# Patient Record
Sex: Male | Born: 1960 | Race: Black or African American | Hispanic: No | Marital: Married | Smoking: Never smoker
Health system: Southern US, Community
[De-identification: ages and names within clinical notes are randomized; demographics above are authoritative.]

## PROBLEM LIST (undated history)

## (undated) DIAGNOSIS — R569 Unspecified convulsions: Secondary | ICD-10-CM

## (undated) DIAGNOSIS — I252 Old myocardial infarction: Secondary | ICD-10-CM

## (undated) DIAGNOSIS — I10 Essential (primary) hypertension: Secondary | ICD-10-CM

## (undated) DIAGNOSIS — E119 Type 2 diabetes mellitus without complications: Secondary | ICD-10-CM

## (undated) DIAGNOSIS — I639 Cerebral infarction, unspecified: Secondary | ICD-10-CM

## (undated) HISTORY — DX: Essential (primary) hypertension: I10

## (undated) HISTORY — DX: Cerebral infarction, unspecified: I63.9

## (undated) HISTORY — DX: Unspecified convulsions: R56.9

---

## 2017-04-09 LAB — CBC AND DIFFERENTIAL
HCT: 28 — AB (ref 41–53)
HEMOGLOBIN: 8.9 — AB (ref 13.5–17.5)
NEUTROS ABS: 6
PLATELETS: 239 (ref 150–399)
WBC: 8.9

## 2017-05-11 DIAGNOSIS — S3982XA Other specified injuries of lower back, initial encounter: Secondary | ICD-10-CM | POA: Diagnosis not present

## 2017-05-11 DIAGNOSIS — M25551 Pain in right hip: Secondary | ICD-10-CM | POA: Diagnosis not present

## 2017-05-11 DIAGNOSIS — R569 Unspecified convulsions: Secondary | ICD-10-CM | POA: Diagnosis not present

## 2017-05-11 DIAGNOSIS — S7000XA Contusion of unspecified hip, initial encounter: Secondary | ICD-10-CM | POA: Diagnosis not present

## 2017-05-11 DIAGNOSIS — S29012A Strain of muscle and tendon of back wall of thorax, initial encounter: Secondary | ICD-10-CM | POA: Diagnosis not present

## 2017-05-11 DIAGNOSIS — S1093XA Contusion of unspecified part of neck, initial encounter: Secondary | ICD-10-CM | POA: Diagnosis not present

## 2017-05-11 DIAGNOSIS — M549 Dorsalgia, unspecified: Secondary | ICD-10-CM | POA: Diagnosis not present

## 2017-06-01 DIAGNOSIS — I10 Essential (primary) hypertension: Secondary | ICD-10-CM | POA: Diagnosis not present

## 2017-06-01 DIAGNOSIS — R112 Nausea with vomiting, unspecified: Secondary | ICD-10-CM | POA: Diagnosis not present

## 2017-06-01 DIAGNOSIS — E119 Type 2 diabetes mellitus without complications: Secondary | ICD-10-CM | POA: Diagnosis not present

## 2017-06-01 DIAGNOSIS — N201 Calculus of ureter: Secondary | ICD-10-CM | POA: Diagnosis not present

## 2017-06-01 DIAGNOSIS — Z7984 Long term (current) use of oral hypoglycemic drugs: Secondary | ICD-10-CM | POA: Diagnosis not present

## 2017-06-01 DIAGNOSIS — R197 Diarrhea, unspecified: Secondary | ICD-10-CM | POA: Diagnosis not present

## 2017-06-01 DIAGNOSIS — N210 Calculus in bladder: Secondary | ICD-10-CM | POA: Diagnosis not present

## 2017-06-01 DIAGNOSIS — R1031 Right lower quadrant pain: Secondary | ICD-10-CM | POA: Diagnosis not present

## 2017-06-01 DIAGNOSIS — R109 Unspecified abdominal pain: Secondary | ICD-10-CM | POA: Diagnosis not present

## 2017-06-01 DIAGNOSIS — N132 Hydronephrosis with renal and ureteral calculous obstruction: Secondary | ICD-10-CM | POA: Diagnosis not present

## 2017-06-01 DIAGNOSIS — Z79899 Other long term (current) drug therapy: Secondary | ICD-10-CM | POA: Diagnosis not present

## 2017-06-04 DIAGNOSIS — R112 Nausea with vomiting, unspecified: Secondary | ICD-10-CM | POA: Diagnosis not present

## 2017-06-04 DIAGNOSIS — N200 Calculus of kidney: Secondary | ICD-10-CM | POA: Diagnosis not present

## 2017-06-04 DIAGNOSIS — R109 Unspecified abdominal pain: Secondary | ICD-10-CM | POA: Diagnosis not present

## 2017-06-04 DIAGNOSIS — I10 Essential (primary) hypertension: Secondary | ICD-10-CM | POA: Diagnosis not present

## 2017-06-04 DIAGNOSIS — N132 Hydronephrosis with renal and ureteral calculous obstruction: Secondary | ICD-10-CM | POA: Diagnosis not present

## 2017-06-04 DIAGNOSIS — Z7984 Long term (current) use of oral hypoglycemic drugs: Secondary | ICD-10-CM | POA: Diagnosis not present

## 2017-06-04 DIAGNOSIS — E119 Type 2 diabetes mellitus without complications: Secondary | ICD-10-CM | POA: Diagnosis not present

## 2017-06-04 DIAGNOSIS — N1330 Unspecified hydronephrosis: Secondary | ICD-10-CM | POA: Diagnosis not present

## 2017-08-12 DIAGNOSIS — I10 Essential (primary) hypertension: Secondary | ICD-10-CM | POA: Diagnosis not present

## 2017-08-12 DIAGNOSIS — I639 Cerebral infarction, unspecified: Secondary | ICD-10-CM | POA: Diagnosis not present

## 2017-08-12 DIAGNOSIS — M25561 Pain in right knee: Secondary | ICD-10-CM | POA: Diagnosis not present

## 2017-08-12 DIAGNOSIS — Z87891 Personal history of nicotine dependence: Secondary | ICD-10-CM | POA: Diagnosis not present

## 2017-08-12 DIAGNOSIS — M545 Low back pain: Secondary | ICD-10-CM | POA: Diagnosis not present

## 2017-08-12 DIAGNOSIS — Z01118 Encounter for examination of ears and hearing with other abnormal findings: Secondary | ICD-10-CM | POA: Diagnosis not present

## 2017-08-12 DIAGNOSIS — Z136 Encounter for screening for cardiovascular disorders: Secondary | ICD-10-CM | POA: Diagnosis not present

## 2017-08-12 DIAGNOSIS — Z5181 Encounter for therapeutic drug level monitoring: Secondary | ICD-10-CM | POA: Diagnosis not present

## 2017-08-12 DIAGNOSIS — Z0000 Encounter for general adult medical examination without abnormal findings: Secondary | ICD-10-CM | POA: Diagnosis not present

## 2017-08-12 DIAGNOSIS — E1165 Type 2 diabetes mellitus with hyperglycemia: Secondary | ICD-10-CM | POA: Diagnosis not present

## 2017-09-02 DIAGNOSIS — I10 Essential (primary) hypertension: Secondary | ICD-10-CM | POA: Diagnosis not present

## 2017-09-02 DIAGNOSIS — M25561 Pain in right knee: Secondary | ICD-10-CM | POA: Diagnosis not present

## 2017-09-02 DIAGNOSIS — Z0000 Encounter for general adult medical examination without abnormal findings: Secondary | ICD-10-CM | POA: Diagnosis not present

## 2017-09-02 DIAGNOSIS — I639 Cerebral infarction, unspecified: Secondary | ICD-10-CM | POA: Diagnosis not present

## 2017-09-02 DIAGNOSIS — E7211 Homocystinuria: Secondary | ICD-10-CM | POA: Diagnosis not present

## 2017-09-02 DIAGNOSIS — N182 Chronic kidney disease, stage 2 (mild): Secondary | ICD-10-CM | POA: Diagnosis not present

## 2017-09-02 DIAGNOSIS — M545 Low back pain: Secondary | ICD-10-CM | POA: Diagnosis not present

## 2017-09-02 DIAGNOSIS — D649 Anemia, unspecified: Secondary | ICD-10-CM | POA: Diagnosis not present

## 2017-09-02 DIAGNOSIS — E1165 Type 2 diabetes mellitus with hyperglycemia: Secondary | ICD-10-CM | POA: Diagnosis not present

## 2017-09-02 DIAGNOSIS — Z87891 Personal history of nicotine dependence: Secondary | ICD-10-CM | POA: Diagnosis not present

## 2017-10-07 DIAGNOSIS — Z87891 Personal history of nicotine dependence: Secondary | ICD-10-CM | POA: Diagnosis not present

## 2017-10-07 DIAGNOSIS — D649 Anemia, unspecified: Secondary | ICD-10-CM | POA: Diagnosis not present

## 2017-10-07 DIAGNOSIS — E1165 Type 2 diabetes mellitus with hyperglycemia: Secondary | ICD-10-CM | POA: Diagnosis not present

## 2017-10-07 DIAGNOSIS — E7211 Homocystinuria: Secondary | ICD-10-CM | POA: Diagnosis not present

## 2017-10-07 DIAGNOSIS — M545 Low back pain: Secondary | ICD-10-CM | POA: Diagnosis not present

## 2017-10-07 DIAGNOSIS — I10 Essential (primary) hypertension: Secondary | ICD-10-CM | POA: Diagnosis not present

## 2017-10-07 DIAGNOSIS — M25561 Pain in right knee: Secondary | ICD-10-CM | POA: Diagnosis not present

## 2017-10-07 DIAGNOSIS — N182 Chronic kidney disease, stage 2 (mild): Secondary | ICD-10-CM | POA: Diagnosis not present

## 2017-10-07 DIAGNOSIS — I639 Cerebral infarction, unspecified: Secondary | ICD-10-CM | POA: Diagnosis not present

## 2017-10-11 DIAGNOSIS — I10 Essential (primary) hypertension: Secondary | ICD-10-CM | POA: Diagnosis not present

## 2017-10-11 DIAGNOSIS — R9431 Abnormal electrocardiogram [ECG] [EKG]: Secondary | ICD-10-CM | POA: Diagnosis not present

## 2017-10-11 DIAGNOSIS — R0609 Other forms of dyspnea: Secondary | ICD-10-CM | POA: Diagnosis not present

## 2017-11-29 DIAGNOSIS — M25561 Pain in right knee: Secondary | ICD-10-CM | POA: Diagnosis not present

## 2017-11-29 DIAGNOSIS — D649 Anemia, unspecified: Secondary | ICD-10-CM | POA: Diagnosis not present

## 2017-11-29 DIAGNOSIS — I10 Essential (primary) hypertension: Secondary | ICD-10-CM | POA: Diagnosis not present

## 2017-11-29 DIAGNOSIS — G629 Polyneuropathy, unspecified: Secondary | ICD-10-CM | POA: Diagnosis not present

## 2017-11-29 DIAGNOSIS — N182 Chronic kidney disease, stage 2 (mild): Secondary | ICD-10-CM | POA: Diagnosis not present

## 2017-11-29 DIAGNOSIS — I119 Hypertensive heart disease without heart failure: Secondary | ICD-10-CM | POA: Diagnosis not present

## 2017-11-29 DIAGNOSIS — Z87891 Personal history of nicotine dependence: Secondary | ICD-10-CM | POA: Diagnosis not present

## 2017-11-29 DIAGNOSIS — E7211 Homocystinuria: Secondary | ICD-10-CM | POA: Diagnosis not present

## 2017-11-29 DIAGNOSIS — I639 Cerebral infarction, unspecified: Secondary | ICD-10-CM | POA: Diagnosis not present

## 2017-11-29 DIAGNOSIS — M545 Low back pain: Secondary | ICD-10-CM | POA: Diagnosis not present

## 2017-11-29 DIAGNOSIS — E1165 Type 2 diabetes mellitus with hyperglycemia: Secondary | ICD-10-CM | POA: Diagnosis not present

## 2017-11-29 DIAGNOSIS — N529 Male erectile dysfunction, unspecified: Secondary | ICD-10-CM | POA: Diagnosis not present

## 2018-02-05 DIAGNOSIS — I10 Essential (primary) hypertension: Secondary | ICD-10-CM | POA: Diagnosis not present

## 2018-02-05 DIAGNOSIS — Z87891 Personal history of nicotine dependence: Secondary | ICD-10-CM | POA: Diagnosis not present

## 2018-02-05 DIAGNOSIS — I639 Cerebral infarction, unspecified: Secondary | ICD-10-CM | POA: Diagnosis not present

## 2018-02-05 DIAGNOSIS — E1165 Type 2 diabetes mellitus with hyperglycemia: Secondary | ICD-10-CM | POA: Diagnosis not present

## 2018-02-05 DIAGNOSIS — D649 Anemia, unspecified: Secondary | ICD-10-CM | POA: Diagnosis not present

## 2018-02-05 DIAGNOSIS — G629 Polyneuropathy, unspecified: Secondary | ICD-10-CM | POA: Diagnosis not present

## 2018-02-05 DIAGNOSIS — N529 Male erectile dysfunction, unspecified: Secondary | ICD-10-CM | POA: Diagnosis not present

## 2018-02-05 DIAGNOSIS — M25561 Pain in right knee: Secondary | ICD-10-CM | POA: Diagnosis not present

## 2018-02-05 DIAGNOSIS — E7211 Homocystinuria: Secondary | ICD-10-CM | POA: Diagnosis not present

## 2018-02-05 DIAGNOSIS — N182 Chronic kidney disease, stage 2 (mild): Secondary | ICD-10-CM | POA: Diagnosis not present

## 2018-02-05 DIAGNOSIS — Z5181 Encounter for therapeutic drug level monitoring: Secondary | ICD-10-CM | POA: Diagnosis not present

## 2018-02-05 DIAGNOSIS — I119 Hypertensive heart disease without heart failure: Secondary | ICD-10-CM | POA: Diagnosis not present

## 2018-02-06 ENCOUNTER — Ambulatory Visit: Payer: Self-pay | Admitting: Podiatry

## 2018-02-10 DIAGNOSIS — I96 Gangrene, not elsewhere classified: Secondary | ICD-10-CM | POA: Diagnosis not present

## 2018-02-10 DIAGNOSIS — M79671 Pain in right foot: Secondary | ICD-10-CM | POA: Diagnosis not present

## 2018-02-10 DIAGNOSIS — E1152 Type 2 diabetes mellitus with diabetic peripheral angiopathy with gangrene: Secondary | ICD-10-CM | POA: Diagnosis not present

## 2018-02-10 DIAGNOSIS — Z8673 Personal history of transient ischemic attack (TIA), and cerebral infarction without residual deficits: Secondary | ICD-10-CM | POA: Diagnosis not present

## 2018-02-10 DIAGNOSIS — E11628 Type 2 diabetes mellitus with other skin complications: Secondary | ICD-10-CM | POA: Diagnosis not present

## 2018-02-10 DIAGNOSIS — I10 Essential (primary) hypertension: Secondary | ICD-10-CM | POA: Diagnosis not present

## 2018-02-10 DIAGNOSIS — Z5320 Procedure and treatment not carried out because of patient's decision for unspecified reasons: Secondary | ICD-10-CM | POA: Diagnosis not present

## 2018-02-10 DIAGNOSIS — L089 Local infection of the skin and subcutaneous tissue, unspecified: Secondary | ICD-10-CM | POA: Diagnosis not present

## 2018-02-11 ENCOUNTER — Ambulatory Visit (INDEPENDENT_AMBULATORY_CARE_PROVIDER_SITE_OTHER): Payer: PPO | Admitting: Podiatry

## 2018-02-11 ENCOUNTER — Encounter (HOSPITAL_COMMUNITY): Payer: Self-pay | Admitting: Emergency Medicine

## 2018-02-11 ENCOUNTER — Inpatient Hospital Stay (HOSPITAL_COMMUNITY)
Admission: EM | Admit: 2018-02-11 | Discharge: 2018-02-22 | DRG: 253 | Disposition: A | Discharge status: Home / Self Care | Payer: PPO | Attending: Internal Medicine | Admitting: Internal Medicine

## 2018-02-11 ENCOUNTER — Emergency Department (HOSPITAL_COMMUNITY): Payer: PPO

## 2018-02-11 DIAGNOSIS — I96 Gangrene, not elsewhere classified: Secondary | ICD-10-CM

## 2018-02-11 DIAGNOSIS — I70203 Unspecified atherosclerosis of native arteries of extremities, bilateral legs: Secondary | ICD-10-CM | POA: Diagnosis not present

## 2018-02-11 DIAGNOSIS — I999 Unspecified disorder of circulatory system: Secondary | ICD-10-CM

## 2018-02-11 DIAGNOSIS — I998 Other disorder of circulatory system: Secondary | ICD-10-CM | POA: Diagnosis not present

## 2018-02-11 DIAGNOSIS — E7800 Pure hypercholesterolemia, unspecified: Secondary | ICD-10-CM | POA: Diagnosis present

## 2018-02-11 DIAGNOSIS — E1129 Type 2 diabetes mellitus with other diabetic kidney complication: Secondary | ICD-10-CM

## 2018-02-11 DIAGNOSIS — Z7984 Long term (current) use of oral hypoglycemic drugs: Secondary | ICD-10-CM | POA: Diagnosis not present

## 2018-02-11 DIAGNOSIS — R269 Unspecified abnormalities of gait and mobility: Secondary | ICD-10-CM | POA: Diagnosis not present

## 2018-02-11 DIAGNOSIS — J90 Pleural effusion, not elsewhere classified: Secondary | ICD-10-CM | POA: Diagnosis not present

## 2018-02-11 DIAGNOSIS — E1152 Type 2 diabetes mellitus with diabetic peripheral angiopathy with gangrene: Principal | ICD-10-CM | POA: Diagnosis present

## 2018-02-11 DIAGNOSIS — L03115 Cellulitis of right lower limb: Secondary | ICD-10-CM | POA: Diagnosis not present

## 2018-02-11 DIAGNOSIS — I2510 Atherosclerotic heart disease of native coronary artery without angina pectoris: Secondary | ICD-10-CM | POA: Diagnosis present

## 2018-02-11 DIAGNOSIS — M79671 Pain in right foot: Secondary | ICD-10-CM | POA: Diagnosis not present

## 2018-02-11 DIAGNOSIS — G40909 Epilepsy, unspecified, not intractable, without status epilepticus: Secondary | ICD-10-CM | POA: Diagnosis not present

## 2018-02-11 DIAGNOSIS — Z79899 Other long term (current) drug therapy: Secondary | ICD-10-CM | POA: Diagnosis not present

## 2018-02-11 DIAGNOSIS — E876 Hypokalemia: Secondary | ICD-10-CM | POA: Diagnosis present

## 2018-02-11 DIAGNOSIS — Z8673 Personal history of transient ischemic attack (TIA), and cerebral infarction without residual deficits: Secondary | ICD-10-CM

## 2018-02-11 DIAGNOSIS — E118 Type 2 diabetes mellitus with unspecified complications: Secondary | ICD-10-CM | POA: Diagnosis not present

## 2018-02-11 DIAGNOSIS — I10 Essential (primary) hypertension: Secondary | ICD-10-CM | POA: Diagnosis not present

## 2018-02-11 DIAGNOSIS — E119 Type 2 diabetes mellitus without complications: Secondary | ICD-10-CM | POA: Diagnosis not present

## 2018-02-11 DIAGNOSIS — I739 Peripheral vascular disease, unspecified: Secondary | ICD-10-CM | POA: Diagnosis not present

## 2018-02-11 DIAGNOSIS — D509 Iron deficiency anemia, unspecified: Secondary | ICD-10-CM | POA: Diagnosis not present

## 2018-02-11 DIAGNOSIS — I252 Old myocardial infarction: Secondary | ICD-10-CM

## 2018-02-11 DIAGNOSIS — R509 Fever, unspecified: Secondary | ICD-10-CM

## 2018-02-11 DIAGNOSIS — R569 Unspecified convulsions: Secondary | ICD-10-CM | POA: Diagnosis not present

## 2018-02-11 HISTORY — DX: Old myocardial infarction: I25.2

## 2018-02-11 HISTORY — DX: Type 2 diabetes mellitus without complications: E11.9

## 2018-02-11 LAB — COMPREHENSIVE METABOLIC PANEL
ALK PHOS: 66 U/L (ref 38–126)
ALT: 17 U/L (ref 17–63)
ANION GAP: 14 (ref 5–15)
AST: 21 U/L (ref 15–41)
Albumin: 4.3 g/dL (ref 3.5–5.0)
BUN: 21 mg/dL — ABNORMAL HIGH (ref 6–20)
CALCIUM: 9.7 mg/dL (ref 8.9–10.3)
CO2: 22 mmol/L (ref 22–32)
CREATININE: 1.44 mg/dL — AB (ref 0.61–1.24)
Chloride: 98 mmol/L — ABNORMAL LOW (ref 101–111)
GFR, EST NON AFRICAN AMERICAN: 52 mL/min — AB (ref >60)
Glucose, Bld: 319 mg/dL — ABNORMAL HIGH (ref 65–99)
Potassium: 4.3 mmol/L (ref 3.5–5.1)
SODIUM: 134 mmol/L — AB (ref 135–145)
Total Bilirubin: 0.6 mg/dL (ref 0.3–1.2)
Total Protein: 8.9 g/dL — ABNORMAL HIGH (ref 6.5–8.1)

## 2018-02-11 LAB — CBC WITH DIFFERENTIAL/PLATELET
Basophils Absolute: 0 10*3/uL (ref 0.0–0.1)
Basophils Relative: 1 %
EOS PCT: 3 %
Eosinophils Absolute: 0.3 10*3/uL (ref 0.0–0.7)
HEMATOCRIT: 31.3 % — AB (ref 39.0–52.0)
Hemoglobin: 10.1 g/dL — ABNORMAL LOW (ref 13.0–17.0)
LYMPHS ABS: 3 10*3/uL (ref 0.7–4.0)
LYMPHS PCT: 39 %
MCH: 24.4 pg — AB (ref 26.0–34.0)
MCHC: 32.3 g/dL (ref 30.0–36.0)
MCV: 75.6 fL — AB (ref 78.0–100.0)
MONO ABS: 0.4 10*3/uL (ref 0.1–1.0)
MONOS PCT: 5 %
NEUTROS ABS: 4 10*3/uL (ref 1.7–7.7)
Neutrophils Relative %: 52 %
PLATELETS: 305 10*3/uL (ref 150–400)
RBC: 4.14 MIL/uL — ABNORMAL LOW (ref 4.22–5.81)
RDW: 12.9 % (ref 11.5–15.5)
WBC: 7.6 10*3/uL (ref 4.0–10.5)

## 2018-02-11 LAB — I-STAT CHEM 8, ED
BUN: 23 mg/dL — ABNORMAL HIGH (ref 6–20)
CALCIUM ION: 1.2 mmol/L (ref 1.15–1.40)
CHLORIDE: 97 mmol/L — AB (ref 101–111)
CREATININE: 1.3 mg/dL — AB (ref 0.61–1.24)
GLUCOSE: 316 mg/dL — AB (ref 65–99)
HCT: 37 % — ABNORMAL LOW (ref 39.0–52.0)
HEMOGLOBIN: 12.6 g/dL — AB (ref 13.0–17.0)
Potassium: 4.3 mmol/L (ref 3.5–5.1)
Sodium: 135 mmol/L (ref 135–145)
TCO2: 29 mmol/L (ref 22–32)

## 2018-02-11 MED ORDER — HYDROCODONE-ACETAMINOPHEN 5-325 MG PO TABS: 1.0000 | ORAL_TABLET | Freq: Once | ORAL | Status: DC

## 2018-02-11 MED ORDER — ONDANSETRON HCL 4 MG/2ML IJ SOLN
4.0000 mg | Freq: Once | INTRAMUSCULAR | Status: AC
  Administered 2018-02-11: 4 mg via INTRAVENOUS
  Filled 2018-02-11: qty 2

## 2018-02-11 MED ORDER — MORPHINE SULFATE (PF) 4 MG/ML IV SOLN
4.0000 mg | Freq: Once | INTRAVENOUS | Status: AC
Start: 2018-02-11 — End: 2018-02-11
  Administered 2018-02-11: 4 mg via INTRAVENOUS
  Filled 2018-02-11: qty 1

## 2018-02-11 MED ORDER — SODIUM CHLORIDE 0.9 % IV BOLUS (SEPSIS)
1000.0000 mL | Freq: Once | INTRAVENOUS | Status: AC
  Administered 2018-02-11: 1000 mL via INTRAVENOUS

## 2018-02-11 MED ORDER — CEFAZOLIN SODIUM-DEXTROSE 1-4 GM/50ML-% IV SOLN
1.0000 g | Freq: Once | INTRAVENOUS | Status: AC
  Administered 2018-02-11: 1 g via INTRAVENOUS
  Filled 2018-02-11: qty 50

## 2018-02-11 MED ORDER — MORPHINE SULFATE 4 MG/ML IJ SOLN
4.00 | INTRAMUSCULAR | Status: DC
Start: ? — End: 2018-02-11

## 2018-02-11 NOTE — ED Triage Notes (Signed)
Patient sent from foot clinic over at [facility] today and sent to ED due vascular problem and possibility need amputation. Patient had pain and black 2 weeks ago.  Noticed swelling more past couple days. Patient is diabetic. Initial happened before Christmas and trying to treat with creams at home.

## 2018-02-11 NOTE — Progress Notes (Signed)
SUBJECTIVE: 57 y.o. year old male presents via wheel chair complaining of severe foot pain and swelling on right foot 2 weeks. He is not able to bear any weight on right foot.  Upon questioning, patient admits being seen at [facility] yesterday and stayed over night. Stated that he walked out since he wasn't getting any treatment.  Stated that he was given Hydrocodone pain medication, which failed to help him. Patient request for something stronger. Wife noted of him having leg cramp in past.  Review of Systems  Constitutional: Negative.   HENT: Negative.   Eyes: Negative.   Respiratory: Negative.   Cardiovascular: Negative.   Gastrointestinal: Negative.   Musculoskeletal: Negative.   Skin: Negative.     OBJECTIVE: DERMATOLOGIC EXAMINATION: Dark discolored dry gangrene distal dorsal 3rd digit right.  VASCULAR EXAMINATION OF LOWER LIMBS: No palpable pulse right foot. Normal pulsation on left. Gangrenous toe 3rd with tissue loss right foot. Forefoot edema right. Cold and purple foot right. Ischemia right forefoot.  NEUROLOGIC EXAMINATION OF THE LOWER LIMBS: Monofilament (Semmes-Weinstein 10-gm) sensory testing normal on left, decreased on right.  MUSCULOSKELETAL EXAMINATION: Positive for gangrene toe 3rd right. Ischemic foot pain right. Severe PVD right with gangrene.  ASSESSMENT: Gangrene 3rd digit right. Severe ischemic foot pain right. PVD right.  PLAN: Reviewed findings and available treatment options. Explained the condition would deteriorate without proper vascular intervention.  Due to the severity of pain and failure to get any relief through oral medication, advised to seek immediate medical care. Patient was advised to go to [facility] for immediate vascular work up. Patient and wife both acknowledged and agreed to be seen at ER.

## 2018-02-11 NOTE — Patient Instructions (Signed)
Seen for pain in right foot. Noted of ischemic pain with gangrene on right 3rd toe. May benefit from urgent Vascular work up via emergency visit. Reviewed findings.

## 2018-02-11 NOTE — ED Provider Notes (Signed)
Patient placed in Quick Look pathway, seen and evaluated   Chief Complaint: right foot swelling.  HPI:   57 y/o with diabetes, CAD, CVA hx comes in with R foot swelling. Pt's toe turned dark 3 weeks ago, and he started having swelling 2 days ago in his foot. Pt seen by Podiatry today and sent to the ER  ROS: toe pain (one) and foot swelling  Physical Exam:   Gen: No distress  Neuro: Awake and Alert  Skin: Warm    Focused Exam: right 3rd toe hyperpigmentation with r foot swelling and edema.   Initiation of care has begun. The patient has been counseled on the process, plan, and necessity for staying for the completion/evaluation, and the remainder of the medical screening examination    Derwood KaplanNanavati, Taylour Lietzke, MD 02/11/18 [phone]

## 2018-02-11 NOTE — ED Provider Notes (Signed)
[facility]-EMERGENCY DEPT Provider Note   CSN: [id number] Arrival date & time: 02/11/18  1356     History   Chief Complaint Chief Complaint  Patient presents with  . sent from foot center    HPI Luke Manning is a 57 y.o. male.  HPI   Luke Manning is a 57 y.o. male, with a history of diabetes, presenting to the ED with with right third toe color change for the last 2 weeks.  Decreased sensation in this toe as well.  Also notes pain and swelling to the distal right foot for the last month.  Pain is aching, 10/10, nonradiating.  Patient was sent from a foot clinic today for possible vascular issue in his toe.  Denies trauma, fever, or any other complaints.     Past Medical History:  Diagnosis Date  . Diabetes mellitus without complication (HCC)   . MI, old     Patient Active Problem List   Diagnosis Date Noted  . Ischemic pain of foot, right 02/11/2018  . Gangrene of foot (HCC) 02/11/2018    History reviewed. No pertinent surgical history.     Home Medications    Prior to Admission medications   Medication Sig Start Date End Date Taking? Authorizing Provider  amLODipine (NORVASC) 5 MG tablet Take 5 mg by mouth daily.   Yes [provider]  folic acid (FOLVITE) 1 MG tablet Take 1 mg by mouth daily. 11/29/17  Yes [provider]  gabapentin (NEURONTIN) 600 MG tablet Take 600 mg by mouth 2 (two) times daily. 01/16/18  Yes [provider]  levETIRAcetam (KEPPRA) 500 MG tablet Take 500 mg by mouth 2 (two) times daily.  01/28/18  Yes [provider]  metFORMIN (GLUCOPHAGE) 500 MG tablet Take 250 mg by mouth daily with breakfast.    Yes [provider]  tiZANidine (ZANAFLEX) 4 MG tablet TAKE 2 TABLETS BY MOUTH 3 TIMES A DAY AS NEEDED 01/14/18  Yes [provider]  HYDROcodone-acetaminophen (NORCO/VICODIN) 5-325 MG tablet Take 1 tablet by mouth every 6 (six) hours. 02/05/18   [provider]     Family History No family history on file.  Social History Social History   Tobacco Use  . Smoking status: Never Smoker  . Smokeless tobacco: Former Engineer, waterUser  Substance Use Topics  . Alcohol use: No    Frequency: Never  . Drug use: Not on file     Allergies   Patient has no known allergies.   Review of Systems Review of Systems  Constitutional: Negative for fever.  Respiratory: Negative for shortness of breath.   Cardiovascular: Negative for chest pain.  Gastrointestinal: Negative for nausea and vomiting.  Musculoskeletal: Positive for arthralgias and joint swelling.  Neurological: Positive for numbness. Negative for weakness.  All other systems reviewed and are negative.    Physical Exam Updated Vital Signs BP 116/76   Pulse 100   Temp (!) 97.4 F (36.3 C) (Oral)   Resp 15   SpO2 100%   Physical Exam  Constitutional: He appears well-developed and well-nourished. No distress.  HENT:  Head: Normocephalic and atraumatic.  Eyes: Conjunctivae are normal.  Neck: Neck supple.  Cardiovascular: Normal rate, regular rhythm, normal heart sounds and intact distal pulses.  No signs of circulation to the tip of the right third toe.  Pulmonary/Chest: Effort normal and breath sounds normal. No respiratory distress.  Abdominal: Soft. There is no tenderness. There is no guarding.  Musculoskeletal: He exhibits edema and  tenderness.  Right third toe appears blackened. Tenderness, erythema, edema, and increased warmth to the distal right dorsal foot  Lymphadenopathy:    He has no cervical adenopathy.  Neurological: He is alert.  Patient has no sensation to the right third toe.  Motor function intact at the MTP joint, but not at the IP joint.  Skin: Skin is warm and dry. He is not diaphoretic.  Psychiatric: He has a normal mood and affect. His behavior is normal.  Nursing note and vitals reviewed.    ED Treatments / Results  Labs (all labs ordered are listed, but only  abnormal results are displayed) Labs Reviewed  CBC WITH DIFFERENTIAL/PLATELET  SEDIMENTATION RATE  C-REACTIVE PROTEIN  COMPREHENSIVE METABOLIC PANEL  I-STAT CHEM 8, ED    EKG  EKG Interpretation None       Radiology Dg Foot 2 Views Right  Result Date: 02/11/2018 CLINICAL DATA:  Gangrene of the third toe. EXAM: RIGHT FOOT - 2 VIEW COMPARISON:  February 10, 2018 FINDINGS: There is no evidence of fracture or dislocation. There is no evidence of osteomyelitis. IMPRESSION: No evidence of osteomyelitis. Electronically Signed   By: Sherian Rein M.D.   On: 02/11/2018 16:56    Procedures Procedures (including critical care time)  Medications Ordered in ED Medications  sodium chloride 0.9 % bolus 1,000 mL (1,000 mLs Intravenous New Bag/Given 02/11/18 2319)  ceFAZolin (ANCEF) IVPB 1 g/50 mL premix (1 g Intravenous New Bag/Given 02/11/18 2320)  morphine 4 MG/ML injection 4 mg (4 mg Intravenous Given 02/11/18 2320)  ondansetron (ZOFRAN) injection 4 mg (4 mg Intravenous Given 02/11/18 2320)     Initial Impression / Assessment and Plan / ED Course  I have reviewed the triage vital signs and the nursing notes.  Pertinent labs & imaging results that were available during my care of the patient were reviewed by me and considered in my medical decision making (see chart for details).      Patient presents with right foot pain and evidence of right toe necrosis. Patient is nontoxic appearing, afebrile, not tachycardic, not tachypneic, not hypotensive, however, has evidence of cellulitis to the foot.  End of shift patient care handoff report given to Sharilyn Sites, PA-C. Plan: Labs pending.  Admission for IV antibiotics.  Findings and plan of care discussed with Drema Pry, MD.   Vitals:   02/11/18 1401 02/11/18 2154 02/11/18 2316  BP: 116/76 135/86 (!) 154/98  Pulse: 100 99 94  Resp: 15 19 18   Temp: (!) 97.4 F (36.3 C) 97.7 F (36.5 C)   TempSrc: Oral Oral   SpO2: 100% 100% 100%     Final Clinical Impressions(s) / ED Diagnoses   Final diagnoses:  Cellulitis of right foot  Toe necrosis [facility])    ED Discharge Orders    None       Concepcion Living 02/11/18 2326    Anselm Pancoast, PA-C 02/11/18 2326    Nira Conn, MD 02/12/18 [phone]

## 2018-02-12 ENCOUNTER — Inpatient Hospital Stay (HOSPITAL_COMMUNITY): Payer: PPO

## 2018-02-12 ENCOUNTER — Other Ambulatory Visit: Payer: Self-pay

## 2018-02-12 ENCOUNTER — Encounter (HOSPITAL_COMMUNITY)
Admission: EM | Disposition: A | Discharge status: Home / Self Care | Payer: Self-pay | Source: Home / Self Care | Attending: Internal Medicine

## 2018-02-12 ENCOUNTER — Encounter (HOSPITAL_COMMUNITY): Payer: Self-pay | Admitting: Internal Medicine

## 2018-02-12 ENCOUNTER — Ambulatory Visit: Payer: Self-pay | Admitting: Podiatry

## 2018-02-12 DIAGNOSIS — Z79899 Other long term (current) drug therapy: Secondary | ICD-10-CM | POA: Diagnosis not present

## 2018-02-12 DIAGNOSIS — Z7984 Long term (current) use of oral hypoglycemic drugs: Secondary | ICD-10-CM | POA: Diagnosis not present

## 2018-02-12 DIAGNOSIS — G40909 Epilepsy, unspecified, not intractable, without status epilepticus: Secondary | ICD-10-CM

## 2018-02-12 DIAGNOSIS — I2510 Atherosclerotic heart disease of native coronary artery without angina pectoris: Secondary | ICD-10-CM | POA: Diagnosis present

## 2018-02-12 DIAGNOSIS — I96 Gangrene, not elsewhere classified: Secondary | ICD-10-CM

## 2018-02-12 DIAGNOSIS — I10 Essential (primary) hypertension: Secondary | ICD-10-CM | POA: Diagnosis present

## 2018-02-12 DIAGNOSIS — E1129 Type 2 diabetes mellitus with other diabetic kidney complication: Secondary | ICD-10-CM

## 2018-02-12 DIAGNOSIS — E118 Type 2 diabetes mellitus with unspecified complications: Secondary | ICD-10-CM | POA: Diagnosis not present

## 2018-02-12 DIAGNOSIS — E119 Type 2 diabetes mellitus without complications: Secondary | ICD-10-CM | POA: Diagnosis present

## 2018-02-12 DIAGNOSIS — Z8673 Personal history of transient ischemic attack (TIA), and cerebral infarction without residual deficits: Secondary | ICD-10-CM | POA: Diagnosis not present

## 2018-02-12 DIAGNOSIS — E1152 Type 2 diabetes mellitus with diabetic peripheral angiopathy with gangrene: Secondary | ICD-10-CM | POA: Diagnosis present

## 2018-02-12 DIAGNOSIS — I252 Old myocardial infarction: Secondary | ICD-10-CM | POA: Diagnosis not present

## 2018-02-12 DIAGNOSIS — E876 Hypokalemia: Secondary | ICD-10-CM | POA: Diagnosis present

## 2018-02-12 DIAGNOSIS — I70203 Unspecified atherosclerosis of native arteries of extremities, bilateral legs: Secondary | ICD-10-CM | POA: Diagnosis present

## 2018-02-12 DIAGNOSIS — L03115 Cellulitis of right lower limb: Secondary | ICD-10-CM | POA: Diagnosis present

## 2018-02-12 DIAGNOSIS — E7800 Pure hypercholesterolemia, unspecified: Secondary | ICD-10-CM | POA: Diagnosis present

## 2018-02-12 DIAGNOSIS — D509 Iron deficiency anemia, unspecified: Secondary | ICD-10-CM | POA: Diagnosis present

## 2018-02-12 DIAGNOSIS — I739 Peripheral vascular disease, unspecified: Secondary | ICD-10-CM | POA: Diagnosis not present

## 2018-02-12 DIAGNOSIS — I998 Other disorder of circulatory system: Secondary | ICD-10-CM | POA: Diagnosis not present

## 2018-02-12 LAB — GLUCOSE, CAPILLARY
GLUCOSE-CAPILLARY: 252 mg/dL — AB (ref 65–99)
GLUCOSE-CAPILLARY: 202 mg/dL — AB (ref 65–99)
GLUCOSE-CAPILLARY: 172 mg/dL — AB (ref 65–99)
Glucose-Capillary: 147 mg/dL — ABNORMAL HIGH (ref 65–99)

## 2018-02-12 LAB — HEMOGLOBIN A1C
HEMOGLOBIN A1C: 11.4 % — AB (ref 4.8–5.6)
Mean Plasma Glucose: 280.48 mg/dL

## 2018-02-12 LAB — PREALBUMIN: Prealbumin: 19.8 mg/dL (ref 18–38)

## 2018-02-12 LAB — HIV ANTIBODY (ROUTINE TESTING W REFLEX): HIV Screen 4th Generation wRfx: NONREACTIVE

## 2018-02-12 LAB — C-REACTIVE PROTEIN: CRP: 3 mg/dL — AB (ref <1.0)

## 2018-02-12 LAB — SEDIMENTATION RATE: Sed Rate: 75 mm/hr — ABNORMAL HIGH (ref 0–16)

## 2018-02-12 SURGERY — AMPUTATION, TOE
Anesthesia: Choice | Laterality: Right

## 2018-02-12 MED ORDER — ONDANSETRON HCL 4 MG/2ML IJ SOLN: 4.0000 mg | Freq: Four times a day (QID) | INTRAMUSCULAR | Status: DC | PRN

## 2018-02-12 MED ORDER — GABAPENTIN 300 MG PO CAPS
600.0000 mg | ORAL_CAPSULE | Freq: Two times a day (BID) | ORAL | Status: DC
  Administered 2018-02-12 – 2018-02-22 (×20): 600 mg via ORAL
  Filled 2018-02-12 (×21): qty 2

## 2018-02-12 MED ORDER — MORPHINE SULFATE (PF) 2 MG/ML IV SOLN
2.0000 mg | INTRAVENOUS | Status: DC | PRN
  Administered 2018-02-12: 2 mg via INTRAVENOUS
  Administered 2018-02-12 (×2): 4 mg via INTRAVENOUS
  Filled 2018-02-12 (×3): qty 2

## 2018-02-12 MED ORDER — SODIUM CHLORIDE 0.9 % IV SOLN
1.0000 g | INTRAVENOUS | Status: DC
  Administered 2018-02-12 – 2018-02-17 (×6): 1 g via INTRAVENOUS
  Filled 2018-02-12 (×6): qty 10

## 2018-02-12 MED ORDER — ONDANSETRON HCL 4 MG PO TABS: 4.0000 mg | ORAL_TABLET | Freq: Four times a day (QID) | ORAL | Status: DC | PRN

## 2018-02-12 MED ORDER — SODIUM CHLORIDE 0.45 % IV SOLN
INTRAVENOUS | Status: AC
  Administered 2018-02-12 – 2018-02-13 (×2): via INTRAVENOUS

## 2018-02-12 MED ORDER — FOLIC ACID 1 MG PO TABS
1.0000 mg | ORAL_TABLET | Freq: Every day | ORAL | Status: DC
  Administered 2018-02-12 – 2018-02-22 (×10): 1 mg via ORAL
  Filled 2018-02-12 (×10): qty 1

## 2018-02-12 MED ORDER — TIZANIDINE HCL 4 MG PO TABS
8.0000 mg | ORAL_TABLET | Freq: Three times a day (TID) | ORAL | Status: DC | PRN
  Filled 2018-02-12: qty 2

## 2018-02-12 MED ORDER — LEVETIRACETAM 500 MG PO TABS
500.0000 mg | ORAL_TABLET | Freq: Two times a day (BID) | ORAL | Status: DC
  Administered 2018-02-12 – 2018-02-22 (×20): 500 mg via ORAL
  Filled 2018-02-12 (×20): qty 1

## 2018-02-12 MED ORDER — ACETAMINOPHEN 325 MG PO TABS
650.0000 mg | ORAL_TABLET | Freq: Four times a day (QID) | ORAL | Status: DC | PRN
  Administered 2018-02-16 – 2018-02-17 (×3): 650 mg via ORAL
  Filled 2018-02-12 (×4): qty 2

## 2018-02-12 MED ORDER — MORPHINE SULFATE (PF) 2 MG/ML IV SOLN
4.0000 mg | Freq: Once | INTRAVENOUS | Status: AC
  Administered 2018-02-12: 4 mg via INTRAVENOUS
  Filled 2018-02-12: qty 2

## 2018-02-12 MED ORDER — ENOXAPARIN SODIUM 40 MG/0.4ML ~~LOC~~ SOLN
40.0000 mg | SUBCUTANEOUS | Status: DC
  Administered 2018-02-12 – 2018-02-16 (×4): 40 mg via SUBCUTANEOUS
  Filled 2018-02-12 (×4): qty 0.4

## 2018-02-12 MED ORDER — INSULIN ASPART 100 UNIT/ML ~~LOC~~ SOLN
0.0000 [IU] | Freq: Three times a day (TID) | SUBCUTANEOUS | Status: DC
  Administered 2018-02-12: 5 [IU] via SUBCUTANEOUS
  Administered 2018-02-12: 1 [IU] via SUBCUTANEOUS
  Administered 2018-02-12 – 2018-02-13 (×2): 3 [IU] via SUBCUTANEOUS
  Administered 2018-02-13: 1 [IU] via SUBCUTANEOUS
  Administered 2018-02-13: 2 [IU] via SUBCUTANEOUS
  Administered 2018-02-14 – 2018-02-18 (×4): 1 [IU] via SUBCUTANEOUS
  Administered 2018-02-19 (×2): 2 [IU] via SUBCUTANEOUS
  Administered 2018-02-20: 1 [IU] via SUBCUTANEOUS
  Administered 2018-02-20 – 2018-02-21 (×2): 2 [IU] via SUBCUTANEOUS
  Administered 2018-02-21: 3 [IU] via SUBCUTANEOUS

## 2018-02-12 MED ORDER — METRONIDAZOLE IN NACL 5-0.79 MG/ML-% IV SOLN
500.0000 mg | Freq: Three times a day (TID) | INTRAVENOUS | Status: DC
  Administered 2018-02-12 – 2018-02-22 (×30): 500 mg via INTRAVENOUS
  Filled 2018-02-12 (×33): qty 100

## 2018-02-12 MED ORDER — MORPHINE SULFATE (PF) 4 MG/ML IV SOLN
2.0000 mg | INTRAVENOUS | Status: DC | PRN
  Administered 2018-02-12 – 2018-02-13 (×3): 4 mg via INTRAVENOUS
  Administered 2018-02-13: 2 mg via INTRAVENOUS
  Administered 2018-02-14 (×3): 4 mg via INTRAVENOUS
  Filled 2018-02-12 (×7): qty 1

## 2018-02-12 MED ORDER — LIVING WELL WITH DIABETES BOOK
Freq: Once | Status: AC
  Administered 2018-02-12: 17:00:00
  Filled 2018-02-12: qty 1

## 2018-02-12 MED ORDER — AMLODIPINE BESYLATE 5 MG PO TABS
5.0000 mg | ORAL_TABLET | Freq: Every day | ORAL | Status: DC
  Administered 2018-02-12 – 2018-02-22 (×10): 5 mg via ORAL
  Filled 2018-02-12 (×10): qty 1

## 2018-02-12 MED ORDER — ACETAMINOPHEN 650 MG RE SUPP: 650.0000 mg | Freq: Four times a day (QID) | RECTAL | Status: DC | PRN

## 2018-02-12 NOTE — Progress Notes (Signed)
[facility] PROGRESS NOTE  Luke Manning ZOX:[id number] DOB: Jan 23, 1961 DOA: 02/11/2018  PCP: Jackie Plum, MD  Brief History/Interval Summary: 57 year old African-American male with past medical history of diabetes mellitus type 2 on oral medications presented with complains of pain in the right third toe.  This is been ongoing for the past 1 month.  Subsequently the toe turned black.  Pain worsened.  He went to see his podiatrist who recommended that he come into the hospital for further evaluation.  Reason for Visit: Gangrene of the right third toe and cellulitis of the forefoot  Consultants: Orthopedics  Procedures:  Right ABI 0.46  Antibiotics: Ceftriaxone and metronidazole  Subjective/Interval History: Patient complains of pain in the right third toe.  7 out of 10 in intensity.  Denies any nausea vomiting.  ROS: No Headache  Objective:  Vital Signs  Vitals:   02/12/18 0130 02/12/18 0226 02/12/18 0606 02/12/18 0800  BP: 113/67 (!) 145/91 125/70 123/66  Pulse: 88 (!) 103 92 92  Resp: 18 20 18 18   Temp:  97.9 F (36.6 C) 97.7 F (36.5 C) 98.1 F (36.7 C)  TempSrc:  Oral Oral Oral  SpO2: 100% 97% 96% 100%  Weight:  54.1 kg (119 lb 4.3 oz)    Height:  5\' 2"  (1.575 m)      Intake/Output Summary (Last 24 hours) at 02/12/2018 1236 Last data filed at 02/12/2018 0604 Gross per 24 hour  Intake 1350 ml  Output -  Net 1350 ml   Filed Weights   02/12/18 0226  Weight: 54.1 kg (119 lb 4.3 oz)    General appearance: alert, cooperative, appears stated age and no distress Head: Normocephalic, without obvious abnormality, atraumatic Resp: clear to auscultation bilaterally Cardio: regular rate and rhythm, S1, S2 normal, no murmur, click, rub or gallop GI: soft, non-tender; bowel sounds normal; no masses,  no organomegaly Extremities: Black discoloration of the right third toe.  Slightly foul-smelling.  No obvious drainage noted.  Poorly palpable  pulses. Neurologic: No obvious focal neurological deficits.  Lab Results:  Data Reviewed: I have personally reviewed following labs and imaging studies  CBC: Recent Labs  Lab 02/11/18 2317 02/11/18 2331  WBC 7.6  --   NEUTROABS 4.0  --   HGB 10.1* 12.6*  HCT 31.3* 37.0*  MCV 75.6*  --   PLT 305  --     Basic Metabolic Panel: Recent Labs  Lab 02/11/18 2318 02/11/18 2331  NA 134* 135  K 4.3 4.3  CL 98* 97*  CO2 22  --   GLUCOSE 319* 316*  BUN 21* 23*  CREATININE 1.44* 1.30*  CALCIUM 9.7  --     GFR: Estimated Creatinine Clearance: 47.4 mL/min (A) (by C-G formula based on SCr of 1.3 mg/dL (H)).  Liver Function Tests: Recent Labs  Lab 02/11/18 2318  AST 21  ALT 17  ALKPHOS 66  BILITOT 0.6  PROT 8.9*  ALBUMIN 4.3    HbA1C: Recent Labs    02/12/18 0239  HGBA1C 11.4*    CBG: Recent Labs  Lab 02/12/18 0844 02/12/18 1221  GLUCAP 252* 202*     Radiology Studies: Dg Foot 2 Views Right  Result Date: 02/11/2018 CLINICAL DATA:  Gangrene of the third toe. EXAM: RIGHT FOOT - 2 VIEW COMPARISON:  February 10, 2018 FINDINGS: There is no evidence of fracture or dislocation. There is no evidence of osteomyelitis. IMPRESSION: No evidence of osteomyelitis. Electronically Signed   By: Sherian Rein M.D.   On:  02/11/2018 16:56     Medications:  Scheduled: . amLODipine  5 mg Oral Daily  . enoxaparin (LOVENOX) injection  40 mg Subcutaneous Q24H  . folic acid  1 mg Oral Daily  . gabapentin  600 mg Oral BID  . insulin aspart  0-9 Units Subcutaneous TID WC  . levETIRAcetam  500 mg Oral BID   Continuous: . cefTRIAXone (ROCEPHIN)  IV 1 g (02/12/18 0604)  . metronidazole Stopped (02/12/18 1155)   QMV:[id number]:acetaminophen **OR** acetaminophen, morphine injection, ondansetron **OR** ondansetron (ZOFRAN) IV, tiZANidine  Assessment/Plan:  Principal Problem:   Gangrene of toe of right foot (HCC) Active Problems:   DM2 (diabetes mellitus, type 2) (HCC)   HTN  (hypertension)   Seizure disorder (HCC)    Gangrene involving the third toe of the right foot with cellulitis of the right forefoot Patient was started on ceftriaxone and metronidazole.  X-ray did not raise any concern for osteomyelitis.  Seen by orthopedics.  Patient underwent ABI which is low the right lower extremity. We will consult vascular surgery to assist with management. Continue pain control.  Discussed with Dr. Myra GianottiBrabham with vascular surgery.  He recommends an arteriogram and recommends [facility].  No clear indication for anticoagulation currently.  History of diabetes mellitus type 2 Continue to hold metformin.  Sliding scale insulin coverage.  Monitor CBGs.  Essential hypertension Monitor blood pressures closely.  History of seizure disorder Continue Keppra  DVT Prophylaxis: Lovenox    Code Status: Full code  Family Communication: Discussed with the patient  Disposition Plan: Management as outlined above.    LOS: 0 days   Osvaldo ShipperGokul Acen Craun  [facility] Pager [phone] 02/12/2018, 12:36 PM  If 7PM-7AM, please contact night-coverage at [email or web address], password [email or web address]

## 2018-02-12 NOTE — H&P (View-Only) (Signed)
Reason for Consult: Right third toe gangrene Referring Physician: Binnie Rail DO  Luke Manning is an 57 y.o. male.  HPI: 58 year old male referred from the foot center to the emergency room for right third toe gangrene and cellulitis on the dorsum of the foot with severe pain.  Patient states he only smoked for 1 week and his entire life and did not like it.  He has has diabetesType II on oral medications.  Past Medical History:  Diagnosis Date  . Diabetes mellitus without complication ([place])   . MI, old     History reviewed. No pertinent surgical history.  Family History  Problem Relation Age of Onset  . Cancer Father   . Diabetes Neg Hx     Social History:  reports that  has never smoked. He has quit using smokeless tobacco. He reports that he does not drink alcohol or use drugs.  Allergies: No Known Allergies  Medications: I have reviewed the patient's current medications.  Results for orders placed or performed during the hospital encounter of 02/11/18 (from the past 48 hour(s))  CBC with Differential     Status: Abnormal   Collection Time: 02/11/18 11:17 PM  Result Value Ref Range   WBC 7.6 4.0 - 10.5 K/uL   RBC 4.14 (L) 4.22 - 5.81 MIL/uL   Hemoglobin 10.1 (L) 13.0 - 17.0 g/dL   HCT 31.3 (L) 39.0 - 52.0 %   MCV 75.6 (L) 78.0 - 100.0 fL   MCH 24.4 (L) 26.0 - 34.0 pg   MCHC 32.3 30.0 - 36.0 g/dL   RDW 12.9 11.5 - 15.5 %   Platelets 305 150 - 400 K/uL   Neutrophils Relative % 52 %   Neutro Abs 4.0 1.7 - 7.7 K/uL   Lymphocytes Relative 39 %   Lymphs Abs 3.0 0.7 - 4.0 K/uL   Monocytes Relative 5 %   Monocytes Absolute 0.4 0.1 - 1.0 K/uL   Eosinophils Relative 3 %   Eosinophils Absolute 0.3 0.0 - 0.7 K/uL   Basophils Relative 1 %   Basophils Absolute 0.0 0.0 - 0.1 K/uL    Comment: Performed at [place] [facility], [place] [address] [place], [place] [address]  Sedimentation rate     Status: Abnormal   Collection Time: 02/11/18 11:17 PM  Result Value  Ref Range   Sed Rate 75 (H) 0 - 16 mm/hr    Comment: Performed at [facility], [place] [address] [place], [place] [address]  C-reactive protein     Status: Abnormal   Collection Time: 02/11/18 11:18 PM  Result Value Ref Range   CRP 3.0 (H) <1.0 mg/dL    Comment: Performed at [place] [address], [place],  [address]  Comprehensive metabolic panel     Status: Abnormal   Collection Time: 02/11/18 11:18 PM  Result Value Ref Range   Sodium 134 (L) 135 - 145 mmol/L   Potassium 4.3 3.5 - 5.1 mmol/L   Chloride 98 (L) 101 - 111 mmol/L   CO2 22 22 - 32 mmol/L   Glucose, Bld 319 (H) 65 - 99 mg/dL   BUN 21 (H) 6 - 20 mg/dL   Creatinine, Ser 1.44 (H) 0.61 - 1.24 mg/dL   Calcium 9.7 8.9 - 10.3 mg/dL   Total Protein 8.9 (H) 6.5 - 8.1 g/dL   Albumin 4.3 3.5 - 5.0 g/dL   AST 21 15 - 41 U/L   ALT 17 17 - 63 U/L  Alkaline Phosphatase 66 38 - 126 U/L   Total Bilirubin 0.6 0.3 - 1.2 mg/dL   GFR calc non Af Amer 52 (L) >60 mL/min   GFR calc Af Amer >60 >60 mL/min    Comment: (NOTE) The eGFR has been calculated using the CKD EPI equation. This calculation has not been validated in all clinical situations. eGFR's persistently <60 mL/min signify possible Chronic Kidney Disease.    Anion gap 14 5 - 15    Comment: Performed at [facility], [place] [address] [place], [place] [address]  I-stat chem 8, ed     Status: Abnormal   Collection Time: 02/11/18 11:31 PM  Result Value Ref Range   Sodium 135 135 - 145 mmol/L   Potassium 4.3 3.5 - 5.1 mmol/L   Chloride 97 (L) 101 - 111 mmol/L   BUN 23 (H) 6 - 20 mg/dL   Creatinine, Ser 1.30 (H) 0.61 - 1.24 mg/dL   Glucose, Bld 316 (H) 65 - 99 mg/dL   Calcium, Ion 1.20 1.15 - 1.40 mmol/L   TCO2 29 22 - 32 mmol/L   Hemoglobin 12.6 (L) 13.0 - 17.0 g/dL   HCT 37.0 (L) 39.0 - 52.0 %    Dg Foot 2 Views Right  Result Date: 02/11/2018 CLINICAL DATA:  Gangrene of the third toe. EXAM: RIGHT FOOT - 2 VIEW  COMPARISON:  February 10, 2018 FINDINGS: There is no evidence of fracture or dislocation. There is no evidence of osteomyelitis. IMPRESSION: No evidence of osteomyelitis. Electronically Signed   By: Abelardo Diesel M.D.   On: 02/11/2018 16:56    Review of Systems  Constitutional: Positive for fever and malaise/fatigue. Negative for chills.  Respiratory: Negative for cough, hemoptysis and sputum production.   Cardiovascular:       History of MI  Genitourinary: Negative for dysuria.  Musculoskeletal: Positive for joint pain.   Blood pressure 125/70, pulse 92, temperature 97.7 F (36.5 C), temperature source Oral, resp. rate 18, height 5' 2"  (1.575 m), weight 119 lb 4.3 oz (54.1 kg), SpO2 96 %. Physical Exam  Constitutional: He is oriented to person, place, and time. He appears well-developed and well-nourished.  HENT:  Head: Normocephalic and atraumatic.  Eyes: Pupils are equal, round, and reactive to light.  Neck: Normal range of motion. Neck supple. No tracheal deviation present. No thyromegaly present.  Cardiovascular: Normal rate and regular rhythm.  Respiratory: Effort normal and breath sounds normal. He has no wheezes.  GI: Soft. He exhibits no distension. There is no tenderness.  Neurological: He is alert and oriented to person, place, and time.  Skin:  Right third toe gangrene  Psychiatric: He has a normal mood and affect. His behavior is normal.    Assessment/Plan: Right third toe gangrene.  Patient has palpable decreased pulses recommend arterial Dopplers.discussed 3rd toe amputation with pt and he agrees to proceed. Will post for this afternoon .   Marybelle Killings 02/12/2018, 8:02 AM

## 2018-02-12 NOTE — ED Provider Notes (Signed)
Assumed care from [place] at shift change.  See prior notes for full H&P.  Briefly, 57 y.o. M sent here from podiatry due to necrotic right 3rd toe.  Has been worsening over the past 2 weeks.  X-ray without findings of osteomyelitis.  Plan:  Labs pending.  Will need admission.  Results for orders placed or performed during the hospital encounter of 02/11/18  CBC with Differential  Result Value Ref Range   WBC 7.6 4.0 - 10.5 K/uL   RBC 4.14 (L) 4.22 - 5.81 MIL/uL   Hemoglobin 10.1 (L) 13.0 - 17.0 g/dL   HCT 31.3 (L) 39.0 - 52.0 %   MCV 75.6 (L) 78.0 - 100.0 fL   MCH 24.4 (L) 26.0 - 34.0 pg   MCHC 32.3 30.0 - 36.0 g/dL   RDW 12.9 11.5 - 15.5 %   Platelets 305 150 - 400 K/uL   Neutrophils Relative % 52 %   Neutro Abs 4.0 1.7 - 7.7 K/uL   Lymphocytes Relative 39 %   Lymphs Abs 3.0 0.7 - 4.0 K/uL   Monocytes Relative 5 %   Monocytes Absolute 0.4 0.1 - 1.0 K/uL   Eosinophils Relative 3 %   Eosinophils Absolute 0.3 0.0 - 0.7 K/uL   Basophils Relative 1 %   Basophils Absolute 0.0 0.0 - 0.1 K/uL  Comprehensive metabolic panel  Result Value Ref Range   Sodium 134 (L) 135 - 145 mmol/L   Potassium 4.3 3.5 - 5.1 mmol/L   Chloride 98 (L) 101 - 111 mmol/L   CO2 22 22 - 32 mmol/L   Glucose, Bld 319 (H) 65 - 99 mg/dL   BUN 21 (H) 6 - 20 mg/dL   Creatinine, Ser 1.44 (H) 0.61 - 1.24 mg/dL   Calcium 9.7 8.9 - 10.3 mg/dL   Total Protein 8.9 (H) 6.5 - 8.1 g/dL   Albumin 4.3 3.5 - 5.0 g/dL   AST 21 15 - 41 U/L   ALT 17 17 - 63 U/L   Alkaline Phosphatase 66 38 - 126 U/L   Total Bilirubin 0.6 0.3 - 1.2 mg/dL   GFR calc non Af Amer 52 (L) >60 mL/min   GFR calc Af Amer >60 >60 mL/min   Anion gap 14 5 - 15  I-stat chem 8, ed  Result Value Ref Range   Sodium 135 135 - 145 mmol/L   Potassium 4.3 3.5 - 5.1 mmol/L   Chloride 97 (L) 101 - 111 mmol/L   BUN 23 (H) 6 - 20 mg/dL   Creatinine, Ser 1.30 (H) 0.61 - 1.24 mg/dL   Glucose, Bld 316 (H) 65 - 99 mg/dL   Calcium, Ion 1.20 1.15 - 1.40 mmol/L   TCO2 29 22 - 32 mmol/L   Hemoglobin 12.6 (L) 13.0 - 17.0 g/dL   HCT 37.0 (L) 39.0 - 52.0 %   Dg Foot 2 Views Right  Result Date: 02/11/2018 CLINICAL DATA:  Gangrene of the third toe. EXAM: RIGHT FOOT - 2 VIEW COMPARISON:  February 10, 2018 FINDINGS: There is no evidence of fracture or dislocation. There is no evidence of osteomyelitis. IMPRESSION: No evidence of osteomyelitis. Electronically Signed   By: Abelardo Diesel M.D.   On: 02/11/2018 16:56    Labs overall reassuring.  CRP and ESR pending.  Spoke with orthopedics, Dr. Lorin Mercy-- he will see patient in the morning.  Patient will be admitted to hospitalist service for ongoing care.   Larene Pickett, PA-C 02/12/18 0138    Jola Schmidt,  MD 02/12/18 8882

## 2018-02-12 NOTE — Progress Notes (Signed)
Patient ID: Luke Manning, male   DOB: 10/17/1961, 57 y.o.   MRN: [id number] Patient has significant abnormality on his ABI.  0.46 right.  Recommend vascular consultation and we will cancel planned toe amputation today and then can reschedule based on their recommendations. Cell [phone]

## 2018-02-12 NOTE — Consult Note (Signed)
Reason for Consult: Right third toe gangrene Referring Physician: Binnie Rail DO  Luke Manning is an 57 y.o. male.  HPI: 57 year old male referred from the foot center to the emergency room for right third toe gangrene and cellulitis on the dorsum of the foot with severe pain.  Patient states he only smoked for 1 week and his entire life and did not like it.  He has has diabetesType II on oral medications.  Past Medical History:  Diagnosis Date  . Diabetes mellitus without complication ([place])   . MI, old     History reviewed. No pertinent surgical history.  Family History  Problem Relation Age of Onset  . Cancer Father   . Diabetes Neg Hx     Social History:  reports that  has never smoked. He has quit using smokeless tobacco. He reports that he does not drink alcohol or use drugs.  Allergies: No Known Allergies  Medications: I have reviewed the patient's current medications.  Results for orders placed or performed during the hospital encounter of 02/11/18 (from the past 48 hour(s))  CBC with Differential     Status: Abnormal   Collection Time: 02/11/18 11:17 PM  Result Value Ref Range   WBC 7.6 4.0 - 10.5 K/uL   RBC 4.14 (L) 4.22 - 5.81 MIL/uL   Hemoglobin 10.1 (L) 13.0 - 17.0 g/dL   HCT 31.3 (L) 39.0 - 52.0 %   MCV 75.6 (L) 78.0 - 100.0 fL   MCH 24.4 (L) 26.0 - 34.0 pg   MCHC 32.3 30.0 - 36.0 g/dL   RDW 12.9 11.5 - 15.5 %   Platelets 305 150 - 400 K/uL   Neutrophils Relative % 52 %   Neutro Abs 4.0 1.7 - 7.7 K/uL   Lymphocytes Relative 39 %   Lymphs Abs 3.0 0.7 - 4.0 K/uL   Monocytes Relative 5 %   Monocytes Absolute 0.4 0.1 - 1.0 K/uL   Eosinophils Relative 3 %   Eosinophils Absolute 0.3 0.0 - 0.7 K/uL   Basophils Relative 1 %   Basophils Absolute 0.0 0.0 - 0.1 K/uL    Comment: Performed at [facility], [place] [address] [place], [place] [address]  Sedimentation rate     Status: Abnormal   Collection Time: 02/11/18 11:17 PM  Result Value  Ref Range   Sed Rate 75 (H) 0 - 16 mm/hr    Comment: Performed at [facility], [place] [address], [place], [place] [address]  C-reactive protein     Status: Abnormal   Collection Time: 02/11/18 11:18 PM  Result Value Ref Range   CRP 3.0 (H) <1.0 mg/dL    Comment: Performed at [place] [address] [place], [place] [address]  Comprehensive metabolic panel     Status: Abnormal   Collection Time: 02/11/18 11:18 PM  Result Value Ref Range   Sodium 134 (L) 135 - 145 mmol/L   Potassium 4.3 3.5 - 5.1 mmol/L   Chloride 98 (L) 101 - 111 mmol/L   CO2 22 22 - 32 mmol/L   Glucose, Bld 319 (H) 65 - 99 mg/dL   BUN 21 (H) 6 - 20 mg/dL   Creatinine, Ser 1.44 (H) 0.61 - 1.24 mg/dL   Calcium 9.7 8.9 - 10.3 mg/dL   Total Protein 8.9 (H) 6.5 - 8.1 g/dL   Albumin 4.3 3.5 - 5.0 g/dL   AST 21 15 - 41 U/L   ALT 17 17 - 63 U/L  Alkaline Phosphatase 66 38 - 126 U/L   Total Bilirubin 0.6 0.3 - 1.2 mg/dL   GFR calc non Af Amer 52 (L) >60 mL/min   GFR calc Af Amer >60 >60 mL/min    Comment: (NOTE) The eGFR has been calculated using the CKD EPI equation. This calculation has not been validated in all clinical situations. eGFR's persistently <60 mL/min signify possible Chronic Kidney Disease.    Anion gap 14 5 - 15    Comment: Performed at [facility], [place] [address], [place], [place] [address]  I-stat chem 8, ed     Status: Abnormal   Collection Time: 02/11/18 11:31 PM  Result Value Ref Range   Sodium 135 135 - 145 mmol/L   Potassium 4.3 3.5 - 5.1 mmol/L   Chloride 97 (L) 101 - 111 mmol/L   BUN 23 (H) 6 - 20 mg/dL   Creatinine, Ser 1.30 (H) 0.61 - 1.24 mg/dL   Glucose, Bld 316 (H) 65 - 99 mg/dL   Calcium, Ion 1.20 1.15 - 1.40 mmol/L   TCO2 29 22 - 32 mmol/L   Hemoglobin 12.6 (L) 13.0 - 17.0 g/dL   HCT 37.0 (L) 39.0 - 52.0 %    Dg Foot 2 Views Right  Result Date: 02/11/2018 CLINICAL DATA:  Gangrene of the third toe. EXAM: RIGHT FOOT - 2 VIEW  COMPARISON:  February 10, 2018 FINDINGS: There is no evidence of fracture or dislocation. There is no evidence of osteomyelitis. IMPRESSION: No evidence of osteomyelitis. Electronically Signed   By: Abelardo Diesel M.D.   On: 02/11/2018 16:56    Review of Systems  Constitutional: Positive for fever and malaise/fatigue. Negative for chills.  Respiratory: Negative for cough, hemoptysis and sputum production.   Cardiovascular:       History of MI  Genitourinary: Negative for dysuria.  Musculoskeletal: Positive for joint pain.   Blood pressure 125/70, pulse 92, temperature 97.7 F (36.5 C), temperature source Oral, resp. rate 18, height 5' 2"  (1.575 m), weight 119 lb 4.3 oz (54.1 kg), SpO2 96 %. Physical Exam  Constitutional: He is oriented to person, place, and time. He appears well-developed and well-nourished.  HENT:  Head: Normocephalic and atraumatic.  Eyes: Pupils are equal, round, and reactive to light.  Neck: Normal range of motion. Neck supple. No tracheal deviation present. No thyromegaly present.  Cardiovascular: Normal rate and regular rhythm.  Respiratory: Effort normal and breath sounds normal. He has no wheezes.  GI: Soft. He exhibits no distension. There is no tenderness.  Neurological: He is alert and oriented to person, place, and time.  Skin:  Right third toe gangrene  Psychiatric: He has a normal mood and affect. His behavior is normal.    Assessment/Plan: Right third toe gangrene.  Patient has palpable decreased pulses recommend arterial Dopplers.discussed 3rd toe amputation with pt and he agrees to proceed. Will post for this afternoon .   Marybelle Killings 02/12/2018, 8:02 AM

## 2018-02-12 NOTE — H&P (Signed)
History and Physical    Luke Manning ZOX:[id number] DOB: [birth year] DOA: 02/11/2018  PCP: Jackie Plum, MD  Patient coming from: Home  I have personally briefly reviewed patient's old medical records in [facility]  Chief Complaint: Gangrene of foot  HPI: Luke Manning is a 57 y.o. male with medical history significant of DM2.  Patient sent in from foot center with c/o R 3rd toe gangrene and cellulitis of fore foot.  This ongoing for past month.  Pain is 10/10, severe.   ED Course: Put on ancef.  Ophelia Charter will see in consult, hospitalist asked to admit.   Review of Systems: As per HPI otherwise 10 point review of systems negative.   Past Medical History:  Diagnosis Date  . Diabetes mellitus without complication (HCC)   . MI, old     History reviewed. No pertinent surgical history.   reports that  has never smoked. He has quit using smokeless tobacco. He reports that he does not drink alcohol. His drug history is not on file.  No Known Allergies  Family History  Problem Relation Age of Onset  . Cancer Father   . Diabetes Neg Hx      Prior to Admission medications   Medication Sig Start Date End Date Taking? Authorizing Provider  amLODipine (NORVASC) 5 MG tablet Take 5 mg by mouth daily.   Yes [provider]  folic acid (FOLVITE) 1 MG tablet Take 1 mg by mouth daily. 11/29/17  Yes [provider]  gabapentin (NEURONTIN) 600 MG tablet Take 600 mg by mouth 2 (two) times daily. 01/16/18  Yes [provider]  levETIRAcetam (KEPPRA) 500 MG tablet Take 500 mg by mouth 2 (two) times daily.  01/28/18  Yes [provider]  metFORMIN (GLUCOPHAGE) 500 MG tablet Take 250 mg by mouth daily with breakfast.    Yes [provider]  tiZANidine (ZANAFLEX) 4 MG tablet TAKE 2 TABLETS BY MOUTH 3 TIMES A DAY AS NEEDED 01/14/18  Yes [provider]    Physical Exam: Vitals:   02/11/18 1401 02/11/18 2154 02/11/18 2316  BP: 116/76 135/86  (!) 154/98  Pulse: 100 99 94  Resp: 15 19 18   Temp: (!) 97.4 F (36.3 C) 97.7 F (36.5 C)   TempSrc: Oral Oral   SpO2: 100% 100% 100%    Constitutional: NAD, calm, comfortable Eyes: PERRL, lids and conjunctivae normal ENMT: Mucous membranes are moist. Posterior pharynx clear of any exudate or lesions.Normal dentition.  Neck: normal, supple, no masses, no thyromegaly Respiratory: clear to auscultation bilaterally, no wheezing, no crackles. Normal respiratory effort. No accessory muscle use.  Cardiovascular: Regular rate and rhythm, no murmurs / rubs / gallops. No extremity edema. 2+ pedal pulses. No carotid bruits.  Abdomen: no tenderness, no masses palpated. No hepatosplenomegaly. Bowel sounds positive.  Musculoskeletal: no clubbing / cyanosis. No joint deformity upper and lower extremities. Good ROM, no contractures. Normal muscle tone.  Skin: gangrene of 3rd toe of R foot.  Cellulitis of right forefoot Neurologic: CN 2-12 grossly intact. Sensation intact, DTR normal. Strength 5/5 in all 4.  Psychiatric: Normal judgment and insight. Alert and oriented x 3. Normal mood.    Labs on Admission: I have personally reviewed following labs and imaging studies  CBC: Recent Labs  Lab 02/11/18 2317 02/11/18 2331  WBC 7.6  --   NEUTROABS 4.0  --   HGB 10.1* 12.6*  HCT 31.3* 37.0*  MCV 75.6*  --   PLT 305  --  Basic Metabolic Panel: Recent Labs  Lab 02/11/18 2318 02/11/18 2331  NA 134* 135  K 4.3 4.3  CL 98* 97*  CO2 22  --   GLUCOSE 319* 316*  BUN 21* 23*  CREATININE 1.44* 1.30*  CALCIUM 9.7  --    GFR: Estimated Creatinine Clearance: 47.8 mL/min (A) (by C-G formula based on SCr of 1.3 mg/dL (H)). Liver Function Tests: Recent Labs  Lab 02/11/18 2318  AST 21  ALT 17  ALKPHOS 66  BILITOT 0.6  PROT 8.9*  ALBUMIN 4.3   No results for input(s): LIPASE, AMYLASE in the last 168 hours. No results for input(s): AMMONIA in the last 168 hours. Coagulation Profile: No  results for input(s): INR, PROTIME in the last 168 hours. Cardiac Enzymes: No results for input(s): CKTOTAL, CKMB, CKMBINDEX, TROPONINI in the last 168 hours. BNP (last 3 results) No results for input(s): PROBNP in the last 8760 hours. HbA1C: No results for input(s): HGBA1C in the last 72 hours. CBG: No results for input(s): GLUCAP in the last 168 hours. Lipid Profile: No results for input(s): CHOL, HDL, LDLCALC, TRIG, CHOLHDL, LDLDIRECT in the last 72 hours. Thyroid Function Tests: No results for input(s): TSH, T4TOTAL, FREET4, T3FREE, THYROIDAB in the last 72 hours. Anemia Panel: No results for input(s): VITAMINB12, FOLATE, FERRITIN, TIBC, IRON, RETICCTPCT in the last 72 hours. Urine analysis: No results found for: COLORURINE, APPEARANCEUR, LABSPEC, PHURINE, GLUCOSEU, HGBUR, BILIRUBINUR, KETONESUR, PROTEINUR, UROBILINOGEN, NITRITE, LEUKOCYTESUR  Radiological Exams on Admission: Dg Foot 2 Views Right  Result Date: 02/11/2018 CLINICAL DATA:  Gangrene of the third toe. EXAM: RIGHT FOOT - 2 VIEW COMPARISON:  February 10, 2018 FINDINGS: There is no evidence of fracture or dislocation. There is no evidence of osteomyelitis. IMPRESSION: No evidence of osteomyelitis. Electronically Signed   By: Sherian ReinWei-Chen  Lin M.D.   On: 02/11/2018 16:56    EKG: Independently reviewed.  Assessment/Plan Principal Problem:   Gangrene of toe of right foot (HCC) Active Problems:   DM2 (diabetes mellitus, type 2) (HCC)   HTN (hypertension)   Seizure disorder (HCC)    1. Gangrene of toe of R foot with cellulitis of R forefoot - 1. Foot wound order set 2. Rocephin / flagyl 3. Labs and VBI per orderset 4. Dr. Ophelia CharterYates to eval in AM 5. Morphine PRN pain 2. DM2 - 1. Hold metformin 2. Sensitive SSI AC 3. HTN - continue home BP meds 4. Seizure disorder - continue keppra  DVT prophylaxis: Lovenox Code Status: Full Family Communication: No family in room Disposition Plan: Home after admit Consults called:  EDP spoke with Dr. Ophelia CharterYates Admission status: Admit to inpatient   [name], [name] [facility] Pager [phone]  If 7AM-7PM, please contact day team taking care of patient [email or web address] Password [email or web address]  02/12/2018, 1:26 AM

## 2018-02-12 NOTE — Progress Notes (Signed)
Inpatient Diabetes Program Recommendations  AACE/ADA: New Consensus Statement on Inpatient Glycemic Control (2015)  Target Ranges:  Prepandial:   less than 140 mg/dL      Peak postprandial:   less than 180 mg/dL (1-2 hours)      Critically ill patients:  140 - 180 mg/dL   Lab Results  Component Value Date   GLUCAP 252 (H) 02/12/2018   HGBA1C 11.4 (H) 02/12/2018    Review of Glycemic Control  Diabetes history: DM2 Outpatient Diabetes medications: metformin 250 mg QD Current orders for Inpatient glycemic control: Novolog 0-9 units tidwc  HgbA1C - 11.4%  Inpatient Diabetes Program Recommendations:     Add Lantus 10 units QHS Will speak with pt regarding his HgbA1C this afternoon.   Thank you. Ailene Ardshonda Juan Kissoon, RD, LDN, CDE Inpatient Diabetes Coordinator [phone]

## 2018-02-12 NOTE — Consult Note (Signed)
WOC Nurse wound consult note Reason for Consult: Third toe gangrene and cellulitis Wound type: infectious (gangrene) Pressure Injury POA: NA Measurement: Third toe, right foot Wound bed: N/A Drainage (amount, consistency, odor) serous, odor consistent with necrotic tissue and infection Periwound: erythematous Dressing procedure/placement/frequency: Patient has been seen by Dr. Ophelia CharterYates (Orthopedics) and is posted for amputation of the infected digit later today.  AN ABI is being performed at the time of this writing to determine likelihood of healing/perfusion of the RLE. There is currently no role for Wound Care at this time. Should that change, please reconsult.  We would be happy to see.  WOC nursing team will not follow, but will remain available to this patient, the nursing and medical teams.  Please re-consult if needed. Thanks, Ladona MowLaurie Shenia Alan, MSN, RN, GNP, Hans EdenCWOCN, CWON-AP, FAAN  Pager# [phone]

## 2018-02-12 NOTE — Progress Notes (Signed)
CSW consulted for "access to meds for discharge". CSW notified patient's RNCM. CSW signing off, no other needs identified at this time.  Celso SickleKimberly Reis Pienta, ConnecticutLCSWA Clinical Social Worker [facility] Cell#: [phone]

## 2018-02-12 NOTE — ED Notes (Signed)
ED TO INPATIENT HANDOFF REPORT  Name/Age/Gender Luke Manning Reason 57 y.o. male  Code Status    Code Status Orders  (From admission, onward)        Start     Ordered   02/12/18 0109  Full code  Continuous     02/12/18 0110    Code Status History    Date Active Date Inactive Code Status Order ID Comments User Context   This patient has a current code status but no historical code status.      Home/SNF/Other Home  Chief Complaint swelling feet  Level of Care/Admitting Diagnosis ED Disposition    ED Disposition Condition Comment   Admit  Hospital Area: [facility] [[id number]]  Level of Care: Med-Surg [16]  Diagnosis: Gangrene of toe of right foot [facility]) [[id number]]  Admitting Physician: Doreatha Massed  Attending Physician: Etta Quill [phone]  Estimated length of stay: past midnight tomorrow  Certification:: I certify this patient will need inpatient services for at least 2 midnights  PT Class (Do Not Modify): Inpatient [101]  PT Acc Code (Do Not Modify): Private [1]       Medical History Past Medical History:  Diagnosis Date  . Diabetes mellitus without complication ([place])   . MI, old     Allergies No Known Allergies  IV Location/Drains/Wounds Patient Lines/Drains/Airways Status   Active Line/Drains/Airways    Name:   Placement date:   Placement time:   Site:   Days:   Peripheral IV 02/11/18 Left Forearm   02/11/18    2318    Forearm   1          Labs/Imaging Results for orders placed or performed during the hospital encounter of 02/11/18 (from the past 48 hour(s))  CBC with Differential     Status: Abnormal   Collection Time: 02/11/18 11:17 PM  Result Value Ref Range   WBC 7.6 4.0 - 10.5 K/uL   RBC 4.14 (L) 4.22 - 5.81 MIL/uL   Hemoglobin 10.1 (L) 13.0 - 17.0 g/dL   HCT 31.3 (L) 39.0 - 52.0 %   MCV 75.6 (L) 78.0 - 100.0 fL   MCH 24.4 (L) 26.0 - 34.0 pg   MCHC 32.3 30.0 - 36.0 g/dL   RDW 12.9 11.5 - 15.5 %   Platelets 305  150 - 400 K/uL   Neutrophils Relative % 52 %   Neutro Abs 4.0 1.7 - 7.7 K/uL   Lymphocytes Relative 39 %   Lymphs Abs 3.0 0.7 - 4.0 K/uL   Monocytes Relative 5 %   Monocytes Absolute 0.4 0.1 - 1.0 K/uL   Eosinophils Relative 3 %   Eosinophils Absolute 0.3 0.0 - 0.7 K/uL   Basophils Relative 1 %   Basophils Absolute 0.0 0.0 - 0.1 K/uL    Comment: Performed at [facility], [place] [address] [place], [place] [address]  Sedimentation rate     Status: Abnormal   Collection Time: 02/11/18 11:17 PM  Result Value Ref Range   Sed Rate 75 (H) 0 - 16 mm/hr    Comment: Performed at [facility], [place] [address] [place], [place] [address]  Comprehensive metabolic panel     Status: Abnormal   Collection Time: 02/11/18 11:18 PM  Result Value Ref Range   Sodium 134 (L) 135 - 145 mmol/L   Potassium 4.3 3.5 - 5.1 mmol/L   Chloride 98 (L) 101 - 111 mmol/L   CO2 22 22 - 32  mmol/L   Glucose, Bld 319 (H) 65 - 99 mg/dL   BUN 21 (H) 6 - 20 mg/dL   Creatinine, Ser 1.44 (H) 0.61 - 1.24 mg/dL   Calcium 9.7 8.9 - 10.3 mg/dL   Total Protein 8.9 (H) 6.5 - 8.1 g/dL   Albumin 4.3 3.5 - 5.0 g/dL   AST 21 15 - 41 U/L   ALT 17 17 - 63 U/L   Alkaline Phosphatase 66 38 - 126 U/L   Total Bilirubin 0.6 0.3 - 1.2 mg/dL   GFR calc non Af Amer 52 (L) >60 mL/min   GFR calc Af Amer >60 >60 mL/min    Comment: (NOTE) The eGFR has been calculated using the CKD EPI equation. This calculation has not been validated in all clinical situations. eGFR's persistently <60 mL/min signify possible Chronic Kidney Disease.    Anion gap 14 5 - 15    Comment: Performed at [facility], [place] [address] [place], [place] [address]  I-stat chem 8, ed     Status: Abnormal   Collection Time: 02/11/18 11:31 PM  Result Value Ref Range   Sodium 135 135 - 145 mmol/L   Potassium 4.3 3.5 - 5.1 mmol/L   Chloride 97 (L) 101 - 111 mmol/L   BUN 23 (H) 6 - 20 mg/dL   Creatinine, Ser 1.30  (H) 0.61 - 1.24 mg/dL   Glucose, Bld 316 (H) 65 - 99 mg/dL   Calcium, Ion 1.20 1.15 - 1.40 mmol/L   TCO2 29 22 - 32 mmol/L   Hemoglobin 12.6 (L) 13.0 - 17.0 g/dL   HCT 37.0 (L) 39.0 - 52.0 %   Dg Foot 2 Views Right  Result Date: 02/11/2018 CLINICAL DATA:  Gangrene of the third toe. EXAM: RIGHT FOOT - 2 VIEW COMPARISON:  February 10, 2018 FINDINGS: There is no evidence of fracture or dislocation. There is no evidence of osteomyelitis. IMPRESSION: No evidence of osteomyelitis. Electronically Signed   By: Abelardo Diesel M.D.   On: 02/11/2018 16:56    Pending Labs Unresulted Labs (From admission, onward)   Start     Ordered   02/12/18 0106  Hemoglobin A1c  Once,   R     02/12/18 0107   02/12/18 0106  HIV antibody  Once,   R     02/12/18 0107   02/12/18 0106  Prealbumin  Once,   R     02/12/18 0107   02/12/18 0106  Blood Cultures x 2 sites  BLOOD CULTURE X 2,   R     02/12/18 0107   02/11/18 1628  C-reactive protein  Once,   STAT     02/11/18 1627      Vitals/Pain Today's Vitals   02/11/18 2300 02/11/18 2316 02/12/18 0130 02/12/18 0132  BP:  (!) 154/98 113/67   Pulse:  94 88   Resp:  18 18   Temp:      TempSrc:      SpO2:  100% 100%   PainSc: 10-Worst pain ever   9     Isolation Precautions No active isolations  Medications Medications  acetaminophen (TYLENOL) tablet 650 mg (not administered)    Or  acetaminophen (TYLENOL) suppository 650 mg (not administered)  ondansetron (ZOFRAN) tablet 4 mg (not administered)    Or  ondansetron (ZOFRAN) injection 4 mg (not administered)  enoxaparin (LOVENOX) injection 40 mg (not administered)  insulin aspart (novoLOG) injection 0-9 Units (not administered)  amLODipine (NORVASC) tablet 5 mg (not administered)  folic acid (FOLVITE) tablet 1 mg (not administered)  gabapentin (NEURONTIN) tablet 600 mg (not administered)  levETIRAcetam (KEPPRA) tablet 500 mg (not administered)  tiZANidine (ZANAFLEX) tablet 8 mg (not administered)   morphine 2 MG/ML injection 2-4 mg (not administered)  cefTRIAXone (ROCEPHIN) 1 g in sodium chloride 0.9 % 100 mL IVPB (not administered)  metroNIDAZOLE (FLAGYL) IVPB 500 mg (not administered)  morphine 4 MG/ML injection 4 mg (4 mg Intravenous Given 02/11/18 2320)  ondansetron (ZOFRAN) injection 4 mg (4 mg Intravenous Given 02/11/18 2320)  sodium chloride 0.9 % bolus 1,000 mL (0 mLs Intravenous Stopped 02/12/18 0032)  ceFAZolin (ANCEF) IVPB 1 g/50 mL premix (0 g Intravenous Stopped 02/11/18 2350)    Mobility walks with device

## 2018-02-12 NOTE — Progress Notes (Signed)
Inpatient Diabetes Program Recommendations  AACE/ADA: New Consensus Statement on Inpatient Glycemic Control (2015)  Target Ranges:  Prepandial:   less than 140 mg/dL      Peak postprandial:   less than 180 mg/dL (1-2 hours)      Critically ill patients:  140 - 180 mg/dL   Lab Results  Component Value Date   GLUCAP 202 (H) 02/12/2018   HGBA1C 11.4 (H) 02/12/2018    Review of Glycemic Control  Spoke with pt regarding his HgbA1C of 11.4%. Pt states he takes his metformin once in a while and has a meter, although he doesn't know where it is. Discussed possibility of going home on insulin. Pt states, "I don't know if I can do that, I don't like needles." Discussed insulin administration and RN to assist patient in giving his own insulin. Will order Insulin Starter Kit and Living Well book. Continue to educate daily. Stressed importance of controlling blood sugars to prevent long-term complications. Pt voiced understanding.  Please consider addition of Lantus 10 units QHS.  Thank you. Lorenda Peck, RD, LDN, CDE Inpatient Diabetes Coordinator [phone]

## 2018-02-12 NOTE — Progress Notes (Addendum)
VASCULAR LAB PRELIMINARY  ARTERIAL  ABI completed:   RIGHT    LEFT    PRESSURE WAVEFORM  PRESSURE WAVEFORM  BRACHIAL 157 Triphasic BRACHIAL 148 Triphasic  DP 72 Monophasic DP Noncompressible Monophasic  AT   AT    PT Noncompressible Monophasic PT Noncompressible Monophasic  PER   PER    GREAT TOE  Absent GREAT TOE 104 NA    RIGHT LEFT  ABI 0.46 Noncompressible  TBI - 0.66   The right ABI is suggestive of severe arterial insufficiency at rest. Unable to calculate left ABI due to noncompressible vessels at rest. Unable to calculate right TBI due to absent waveform, left TBI is abnormal at rest.  Preliminary results discussed with Dr. Ophelia CharterYates.  02/12/2018 9:52 AM Gertie FeyMichelle Shaddai Shapley, BS, RVT, RDCS, RDMS

## 2018-02-13 DIAGNOSIS — I998 Other disorder of circulatory system: Secondary | ICD-10-CM

## 2018-02-13 DIAGNOSIS — E118 Type 2 diabetes mellitus with unspecified complications: Secondary | ICD-10-CM

## 2018-02-13 DIAGNOSIS — I10 Essential (primary) hypertension: Secondary | ICD-10-CM

## 2018-02-13 DIAGNOSIS — L03115 Cellulitis of right lower limb: Secondary | ICD-10-CM

## 2018-02-13 DIAGNOSIS — I739 Peripheral vascular disease, unspecified: Secondary | ICD-10-CM

## 2018-02-13 LAB — GLUCOSE, CAPILLARY
GLUCOSE-CAPILLARY: 198 mg/dL — AB (ref 65–99)
Glucose-Capillary: 206 mg/dL — ABNORMAL HIGH (ref 65–99)
Glucose-Capillary: 126 mg/dL — ABNORMAL HIGH (ref 65–99)
Glucose-Capillary: 141 mg/dL — ABNORMAL HIGH (ref 65–99)

## 2018-02-13 MED ORDER — JUVEN PO PACK
1.0000 | PACK | Freq: Two times a day (BID) | ORAL | Status: DC
  Administered 2018-02-13 – 2018-02-21 (×12): 1 via ORAL
  Filled 2018-02-13 (×20): qty 1

## 2018-02-13 MED ORDER — PRO-STAT SUGAR FREE PO LIQD: 30.0000 mL | Freq: Two times a day (BID) | ORAL | Status: DC

## 2018-02-13 MED ORDER — INSULIN GLARGINE 100 UNIT/ML ~~LOC~~ SOLN
10.0000 [IU] | Freq: Every day | SUBCUTANEOUS | Status: DC
  Administered 2018-02-13 – 2018-02-21 (×9): 10 [IU] via SUBCUTANEOUS
  Filled 2018-02-13 (×12): qty 0.1

## 2018-02-13 NOTE — H&P (View-Only) (Signed)
[facility] [place] CONSULT NOTE   MRN : [id number]  Reason for Consult: PAD with ischemic changes to the right third toe Referring Physician: Dr. gardner  History of Present Illness: 56 y/o male with right third toe dark skin discoloration and cellulitis followed at the foot center.  He has had increased right foot pain over the last month.  He was seen by Dr. Yates in consultation for third toe amputation.  He was unable to illicit palpable pulses and ordered ABI's.  He has evidence of absent TBI with non compressible PTA and monophasic flow in the DP 0.46.  He has no wounds on the left LE, but ABI's are likely falsely elevated at 0.66 with noncompressible arteries. We have been consult for possible arterial intervention and to determine if the third toe amputation site will heal.    Past medical history : DM managed with Metformin and HTN managed with Norvasc, and seizures managed with Keppra.   He denise CAD and hypercholesterolemia.       Current Facility-Administered Medications  Medication Dose Route Frequency Provider Last Rate Last Dose  . 0.45 % sodium chloride infusion   Intravenous Continuous Krishnan, Gokul, MD 75 mL/hr at 02/13/18 0127    . acetaminophen (TYLENOL) tablet 650 mg  650 mg Oral Q6H PRN Gardner, Jared M, DO       Or  . acetaminophen (TYLENOL) suppository 650 mg  650 mg Rectal Q6H PRN Gardner, Jared M, DO      . amLODipine (NORVASC) tablet 5 mg  5 mg Oral Daily Gardner, Jared M, DO   5 mg at 02/12/18 1055  . cefTRIAXone (ROCEPHIN) 1 g in sodium chloride 0.9 % 100 mL IVPB  1 g Intravenous Q24H Gardner, Jared M, DO   Stopped at 02/13/18 0530  . enoxaparin (LOVENOX) injection 40 mg  40 mg Subcutaneous Q24H Gardner, Jared M, DO   40 mg at 02/12/18 1055  . folic acid (FOLVITE) tablet 1 mg  1 mg Oral Daily Gardner, Jared M, DO   1 mg at 02/12/18 1054  . gabapentin (NEURONTIN) capsule 600 mg  600 mg Oral BID Gardner, Jared M, DO   600 mg at 02/12/18 2008   . insulin aspart (novoLOG) injection 0-9 Units  0-9 Units Subcutaneous TID WC Gardner, Jared M, DO   3 Units at 02/13/18 0900  . levETIRAcetam (KEPPRA) tablet 500 mg  500 mg Oral BID Gardner, Jared M, DO   500 mg at 02/12/18 2009  . metroNIDAZOLE (FLAGYL) IVPB 500 mg  500 mg Intravenous Q8H Gardner, Jared M, DO   Stopped at 02/13/18 0226  . morphine 4 MG/ML injection 2-4 mg  2-4 mg Intravenous Q4H PRN Krishnan, Gokul, MD   2 mg at 02/13/18 0836  . ondansetron (ZOFRAN) tablet 4 mg  4 mg Oral Q6H PRN Gardner, Jared M, DO       Or  . ondansetron (ZOFRAN) injection 4 mg  4 mg Intravenous Q6H PRN Gardner, Jared M, DO      . tiZANidine (ZANAFLEX) tablet 8 mg  8 mg Oral Q8H PRN Gardner, Jared M, DO        Pt meds include: Statin :No Betablocker: No ASA: No Other anticoagulants/antiplatelets: none  Past Medical History:  Diagnosis Date  . Diabetes mellitus without complication (HCC)   . MI, old     History reviewed. No pertinent surgical history.  Social History Social History   Tobacco Use  . Smoking status: Never Smoker  .   Smokeless tobacco: Former User  Substance Use Topics  . Alcohol use: No    Frequency: Never  . Drug use: No    Family History Family History  Problem Relation Age of Onset  . Cancer Father   . Diabetes Neg Hx     No Known Allergies   REVIEW OF SYSTEMS  General: [ ] Weight loss, [ ] Fever, [ ] chills Neurologic: [ ] Dizziness, [ ] Blackouts, [x ] Seizure [ ] Stroke, [ ] "Mini stroke", [ ] Slurred speech, [ ] Temporary blindness; [ ] weakness in arms or legs, [ ] Hoarseness [ ] Dysphagia Cardiac: [ ] Chest pain/pressure, [ ] Shortness of breath at rest [ ] Shortness of breath with exertion, [ ] Atrial fibrillation or irregular heartbeat  Vascular: [x ] Pain in legs with walking, [x ] Pain in legs at rest, [ ] Pain in legs at night,  [ ] Non-healing ulcer, [ ] Blood clot in vein/DVT,   Pulmonary: [ ] Home oxygen, [ ] Productive cough, [ ] Coughing up  blood, [ ] Asthma,  [ ] Wheezing [ ] COPD Musculoskeletal:  [ ] Arthritis, [ ] Low back pain, [ ] Joint pain Hematologic: [ ] Easy Bruising, [ ] Anemia; [ ] Hepatitis Gastrointestinal: [ ] Blood in stool, [ ] Gastroesophageal Reflux/heartburn, Urinary: [ ] chronic Kidney disease, [ ] on HD - [ ] MWF or [ ] TTHS, [ ] Burning with urination, [ ] Difficulty urinating Skin: [ ] Rashes, [ ] Wounds Psychological: [ ] Anxiety, [ ] Depression  Physical Examination Vitals:   02/12/18 0800 02/12/18 1200 02/12/18 2149 02/13/18 0428  BP: 123/66 124/68 129/69 128/68  Pulse: 92 87 98 99  Resp: 18 18 18 19  Temp: 98.1 F (36.7 C) 98 F (36.7 C) 99 F (37.2 C) 99 F (37.2 C)  TempSrc: Oral Oral Oral Oral  SpO2: 100% 100% 100% 100%  Weight:      Height:       Body mass index is 21.81 kg/m.  General:  WDWN in NAD HENT: WNL Eyes: Pupils equal Pulmonary: normal non-labored breathing , without Rales, rhonchi,  wheezing Cardiac: RRR, without  Murmurs, rubs or gallops; No carotid bruits Abdomen: soft, NT, no masses Skin: no rashes, ulcers noted;  Positive dry Gangrene right third toe with cellulitis on the dorsum of the foot; no open wounds;   Vascular Exam/Pulses:Palpable radial , left femoral> right femoral absent pedal pulses   Musculoskeletal: no muscle wasting or atrophy; positive mild edema Right foot Neurologic: A&O X 3; Appropriate Affect ;  SENSATION: normal; MOTOR FUNCTION: 5/5 Symmetric, decreased right LE secondary to pain. Speech is fluent/normal   Significant Diagnostic Studies: CBC Lab Results  Component Value Date   WBC 7.6 02/11/2018   HGB 12.6 (L) 02/11/2018   HCT 37.0 (L) 02/11/2018   MCV 75.6 (L) 02/11/2018   PLT 305 02/11/2018    BMET    Component Value Date/Time   NA 135 02/11/2018 2331   K 4.3 02/11/2018 2331   CL 97 (L) 02/11/2018 2331   CO2 22 02/11/2018 2318   GLUCOSE 316 (H) 02/11/2018 2331   BUN 23 (H) 02/11/2018 2331   CREATININE 1.30 (H)  02/11/2018 2331   CALCIUM 9.7 02/11/2018 2318   GFRNONAA 52 (L) 02/11/2018 2318   GFRAA >60 02/11/2018 2318   Estimated Creatinine Clearance: 47.4 mL/min (A) (by C-G formula based on SCr of 1.3 mg/dL (H)).  COAG No results   found for: INR, PROTIME   Non-Invasive Vascular Imaging:  ABI's right non compressible PT, monophasic flow 0.46 Left non compressible monophasic flow 0.66  ASSESSMENT/PLAN:  PAD with right third toe ischemic changes > 1 month.   Pain and cellulitis.  His ABI's reveal evidence of sever tibial disease.  He has a weak palpable right femoral pulse which may also suggest iliac disease as well.    Plan angiogram tomorrow by Dr. Fields with possible intervention.  NPO past MN.  Post procedure he will have to hold his Metformin and be placed on a sliding scale due to use of contrast dye.  His current Cr/GFR is 1.4/>60.   Katrina Brosh Maureen Stephanye Finnicum 02/13/2018 10:47 AM  I agree with the above.  I have seen and evaluated the patient. He was seen yesterday in consultation by Dr. Yates for a gangrenous right third toe.  He had vascular lab studies that showed suboptimal blood flow for wound healing to his right leg.  I discussed the limb threatening nature of the situation.  In order to optimize his attempts at limb salvage, we need to proceed with angiography.  This will be through a left femoral approach.  Intervention will be performed if indicated.  He will be n.p.o. after midnight with procedure scheduled for Friday, February 22  Wells Brabham 

## 2018-02-13 NOTE — Progress Notes (Signed)
I will see patient on Thursday.  Tentatively scheduled for angio on Friday.  Luke Manning

## 2018-02-13 NOTE — Progress Notes (Signed)
PROGRESS NOTE  Luke Hickmanlvin Kizziah ZOX:[id number] DOB: 09/18/1961 DOA: 02/11/2018 PCP: Jackie Plumsei-Bonsu, George, MD  HPI/Recap of past 2124 hours: 57 year old African-American male with past medical history of diabetes mellitus type 2 on oral medications presented with complains of pain in the right third toe, ongoing for the past 1 month. Pt went to see his podiatrist who recommended that he come into the hospital for further evaluation. In the ED, toe noted to be gangrenous. Pt admitted for further management.  Today, pt denies any new compliants, no chest pain, SOB, abdominal pain, N/V, fever/chills. Pt scheduled for angiogram on 02/14/18.   Assessment/Plan: Principal Problem:   Gangrene of toe of right foot (HCC) Active Problems:   DM2 (diabetes mellitus, type 2) (HCC)   HTN (hypertension)   Seizure disorder (HCC)  R foot cellulitis with gangrenous 3rd toe Afebrile, no leukocytosis Foot xray, no concern for osteomyelitis ABI abnormal, R ABI 0.46, vascular surgery consulted Plan for angiogram with possible intervention on 02/14/18 Ortho consulted, surgery pending angiogram Continue ceftriaxone and metronidazole  Diabetes mellitus type 2 Uncontrolled, A1c 11.4 SSI, lantus 10U @ bedtime Will d/c pt on insulin and plan to d/c metformin on discharge Hypoglycemic protocol  Essential hypertension Stable Continue amlodipine  History of seizure disorder Continue Keppra   Code Status: Full  Family Communication: None at bedside  Disposition Plan: TBD    Consultants:  Vascular  Orthopedics  Procedures:  None  Antimicrobials:  IV ceftriaxone  IV metronidazole  DVT prophylaxis:  Lovenox   Objective: Vitals:   02/12/18 1200 02/12/18 2149 02/13/18 0428 02/13/18 1459  BP: 124/68 129/69 128/68 120/73  Pulse: 87 98 99 94  Resp: 18 18 19 18   Temp: 98 F (36.7 C) 99 F (37.2 C) 99 F (37.2 C) 99.2 F (37.3 C)  TempSrc: Oral Oral Oral Oral  SpO2: 100% 100% 100% 100%    Weight:      Height:        Intake/Output Summary (Last 24 hours) at 02/13/2018 1722 Last data filed at 02/13/2018 1300 Gross per 24 hour  Intake 520 ml  Output 2700 ml  Net -2180 ml   Filed Weights   02/12/18 0226  Weight: 54.1 kg (119 lb 4.3 oz)    Exam:   General:  NAD  Cardiovascular: S1, S2 present  Respiratory: CTA  Abdomen: Soft, NT, ND, BS+  Musculoskeletal: Dry gangrenous R 3rd toe, poor pulses, no pedal edema b/l  Skin: Normal  Psychiatry: Normal mood   Data Reviewed: CBC: Recent Labs  Lab 02/11/18 2317 02/11/18 2331  WBC 7.6  --   NEUTROABS 4.0  --   HGB 10.1* 12.6*  HCT 31.3* 37.0*  MCV 75.6*  --   PLT 305  --    Basic Metabolic Panel: Recent Labs  Lab 02/11/18 2318 02/11/18 2331  NA 134* 135  K 4.3 4.3  CL 98* 97*  CO2 22  --   GLUCOSE 319* 316*  BUN 21* 23*  CREATININE 1.44* 1.30*  CALCIUM 9.7  --    GFR: Estimated Creatinine Clearance: 47.4 mL/min (A) (by C-G formula based on SCr of 1.3 mg/dL (H)). Liver Function Tests: Recent Labs  Lab 02/11/18 2318  AST 21  ALT 17  ALKPHOS 66  BILITOT 0.6  PROT 8.9*  ALBUMIN 4.3   No results for input(s): LIPASE, AMYLASE in the last 168 hours. No results for input(s): AMMONIA in the last 168 hours. Coagulation Profile: No results for input(s): INR, PROTIME in the last  168 hours. Cardiac Enzymes: No results for input(s): CKTOTAL, CKMB, CKMBINDEX, TROPONINI in the last 168 hours. BNP (last 3 results) No results for input(s): PROBNP in the last 8760 hours. HbA1C: Recent Labs    02/12/18 0239  HGBA1C 11.4*   CBG: Recent Labs  Lab 02/12/18 1623 02/12/18 2149 02/13/18 0753 02/13/18 1157 02/13/18 1717  GLUCAP 147* 172* 206* 126* 198*   Lipid Profile: No results for input(s): CHOL, HDL, LDLCALC, TRIG, CHOLHDL, LDLDIRECT in the last 72 hours. Thyroid Function Tests: No results for input(s): TSH, T4TOTAL, FREET4, T3FREE, THYROIDAB in the last 72 hours. Anemia Panel: No  results for input(s): VITAMINB12, FOLATE, FERRITIN, TIBC, IRON, RETICCTPCT in the last 72 hours. Urine analysis: No results found for: COLORURINE, APPEARANCEUR, LABSPEC, PHURINE, GLUCOSEU, HGBUR, BILIRUBINUR, KETONESUR, PROTEINUR, UROBILINOGEN, NITRITE, LEUKOCYTESUR Sepsis Labs: @LABRCNTIP (procalcitonin:4,lacticidven:4)  ) Recent Results (from the past 240 hour(s))  Blood Cultures x 2 sites     Status: None (Preliminary result)   Collection Time: 02/12/18  2:39 AM  Result Value Ref Range Status   Specimen Description   Final    BLOOD LEFT ARM Performed at [facility], [address], [place], [place] [address]    Special Requests   Final    IN PEDIATRIC BOTTLE Blood Culture adequate volume Performed at [facility], [address], [place], [place] [address]    Culture   Final    NO GROWTH 1 DAY Performed at [facility], [address], [place], [place] [address]    Report Status PENDING  Incomplete  Blood Cultures x 2 sites     Status: None (Preliminary result)   Collection Time: 02/12/18  2:39 AM  Result Value Ref Range Status   Specimen Description   Final    BLOOD RIGHT ANTECUBITAL Performed at [facility], [address] [place], [place] [address]    Special Requests   Final    BOTTLES DRAWN AEROBIC AND ANAEROBIC Blood Culture adequate volume Performed at [facility], [address], [place], [place] [address]    Culture   Final    NO GROWTH 1 DAY Performed at [facility], [address] [place], [place] [address]    Report Status PENDING  Incomplete      Studies: No results found.  Scheduled Meds: . amLODipine  5 mg Oral Daily  . enoxaparin (LOVENOX) injection  40 mg Subcutaneous Q24H  . folic acid  1 mg Oral Daily  . gabapentin  600 mg Oral BID  . insulin aspart  0-9 Units Subcutaneous TID WC  . levETIRAcetam  500 mg Oral BID  . nutrition supplement (JUVEN)  1  packet Oral BID BM    Continuous Infusions: . cefTRIAXone (ROCEPHIN)  IV Stopped (02/13/18 0530)  . metronidazole Stopped (02/13/18 1215)     LOS: 1 day     Briant Cedar, MD [facility]   If 7PM-7AM, please contact night-coverage [email or web address] Password [facility] 02/13/2018, 5:22 PM

## 2018-02-13 NOTE — Progress Notes (Signed)
Inpatient Diabetes Program Recommendations  AACE/ADA: New Consensus Statement on Inpatient Glycemic Control (2015)  Target Ranges:  Prepandial:   less than 140 mg/dL      Peak postprandial:   less than 180 mg/dL (1-2 hours)      Critically ill patients:  140 - 180 mg/dL   Lab Results  Component Value Date   GLUCAP 206 (H) 02/13/2018   HGBA1C 11.4 (H) 02/12/2018    Review of Glycemic Control  Results for Luke Manning, Luke Manning (MRN [id number]) as of 02/13/2018 09:51  Ref. Range 02/12/2018 08:44 02/12/2018 12:21 02/12/2018 16:23 02/12/2018 21:49 02/13/2018 07:53  Glucose-Capillary Latest Ref Range: 65 - 99 mg/dL 252 (H) 202 (H) 147 (H) 172 (H) 206 (H)    Diabetes history: DM2 Outpatient Diabetes medications: Metformin 250 mg QD Current orders for Inpatient glycemic control: Novolog 0-9 units tid  HgbA1C - 11.4%- Diabetes Coordinator spoke to patient regarding elevated A1C and the probability of going home on insulin- yesterday  Inpatient Diabetes Program Recommendations:     Consider adding  Lantus 10 units QHS (0.2units/kg)  RN to assist patient in giving his own insulin.  Insulin Starter Kit and Living Well book ordered. Continue to educate daily.     Please have patient watch patient education videos on diabetes: 501-Basic skills for controlling diabetes; 111-NBVAPOLIDC Long Term Complications; 301-THYHOOIL Your Diabetes; 504-Introduction to Carb Counting; 505-Diabetes: Foot Care; 506-Diabetes: Insulin Injection; 507-Diabetes & Nutrition: Eating for Health; 508-Diabetes: Reducing Fears About Injections; 509-Diabetes and Heart Disease; 510-Monitoring Blood Glucose; 511-How to use an insulin pen    Gentry Fitz, RN, [place], [place], CDE Diabetes Coordinator Inpatient Diabetes Program  [phone] (Team Pager) [phone] ([place]) 02/13/2018 9:52 AM

## 2018-02-13 NOTE — Progress Notes (Signed)
Initial Nutrition Assessment  DOCUMENTATION CODES:   Not applicable  INTERVENTION:   -1 packet Juven BID, each packet provides 80 calories, 8 grams of carbohydrate, and 14 grams of amino acids; supplement contains CaHMB, glutamine, and arginine, to promote wound healing  NUTRITION DIAGNOSIS:   Increased nutrient needs related to wound healing as evidenced by estimated needs.  GOAL:   Patient will meet greater than or equal to 90% of their needs  MONITOR:   PO intake, Supplement acceptance, Labs, Weight trends, Skin, I & O's  REASON FOR ASSESSMENT:   Consult Wound healing  ASSESSMENT:   Luke Manning is a 57 y.o. male with medical history significant of DM2.  Patient sent in from foot center with c/o R 3rd toe gangrene and cellulitis of fore foot.  This ongoing for past month.  Pain is 10/10, severe.  Pt admitted with gangrene of rt foot and cellulitis of rt foot.  Pt was on phone at time of visit and did not disengage in conversation at time of visit.    Pt with good appetite; noted 100% meal completion per doc flowsheets.   Reviewed wt hx; noted dramatic changes in wt readings (119# vs 132#). Per CareEverywhere records, UBW around 130# (wt on 05/06/15). Wt 57.3 kg (126#) when verified on bedscale . Used bedscale wt when determining needs.   Last Hgb A1c: 11.4 (02/12/18). PTA DM medications are 250 mg Metformin daily. Per DM coordinator note, pt will likely need insulin at discharge, however, pt is afraid of needles.   Labs reviewed: CBGS: 126-206 (inpatient orders for glycemic control are 0-9 units insulin aspart TID with meals)  NUTRITION - FOCUSED PHYSICAL EXAM:    Most Recent Value  Orbital Region  No depletion  Upper Arm Region  No depletion  Thoracic and Lumbar Region  No depletion  Buccal Region  No depletion  Temple Region  No depletion  Clavicle Bone Region  No depletion  Clavicle and Acromion Bone Region  No depletion  Scapular Bone Region  No depletion   Dorsal Hand  No depletion  Patellar Region  Mild depletion  Anterior Thigh Region  Mild depletion  Posterior Calf Region  Mild depletion  Edema (RD Assessment)  None  Hair  Reviewed  Eyes  Reviewed  Mouth  Reviewed  Skin  Reviewed  Nails  Reviewed       Diet Order:  Diet Carb Modified Fluid consistency: Thin; Room service appropriate? Yes Diet NPO time specified Except for: Sips with Meds  EDUCATION NEEDS:   No education needs have been identified at this time  Skin:  Skin Assessment: Skin Integrity Issues: Skin Integrity Issues:: Other (Comment) Other: rt third toe gangre with rt outer foot cellulitis  Last BM:  02/11/18  Height:   Ht Readings from Last 1 Encounters:  02/12/18 5\' 2"  (1.575 m)    Weight:   Wt Readings from Last 1 Encounters:  02/12/18 119 lb 4.3 oz (54.1 kg)    Ideal Body Weight:  50 kg  BMI:  Body mass index is 21.81 kg/m.  Estimated Nutritional Needs:   Kcal:  1700-1900  Protein:  85-100 grams  Fluid:  1.7-1.9 L    Martrice Apt A. Mayford KnifeWilliams, RD, LDN, CDE Pager: [phone] After hours Pager: [phone]

## 2018-02-13 NOTE — Care Management Note (Signed)
Case Management Note  Patient Details  Name: Rob Hickmanlvin Bryant MRN: [id number] Date of Birth: 09/28/1961  Subjective/Objective:                    Action/Plan:  Consult for home health needs. Will continue to follow for discharge needs.  Expected Discharge Date:                  Expected Discharge Plan:  Home w Home Health Services  In-House Referral:     Discharge planning Services  CM Consult  Post Acute Care Choice:  Home Health, Durable Medical Equipment Choice offered to:     DME Arranged:    DME Agency:     HH Arranged:    HH Agency:     Status of Service:  In process, will continue to follow  If discussed at Long Length of Stay Meetings, dates discussed:    Additional Comments:  Kingsley PlanWile, Caeson Filippi Marie, RN 02/13/2018, 10:49 AM

## 2018-02-13 NOTE — Consult Note (Addendum)
[facility] NOTE   MRN : [id number]  Reason for Consult: PAD with ischemic changes to the right third toe Referring Physician: Dr. Julian Reil  History of Present Illness: 57 y/o male with right third toe dark skin discoloration and cellulitis followed at the foot center.  He has had increased right foot pain over the last month.  He was seen by Dr. Ophelia Charter in consultation for third toe amputation.  He was unable to illicit palpable pulses and ordered ABI's.  He has evidence of absent TBI with non compressible PTA and monophasic flow in the DP 0.46.  He has no wounds on the left LE, but ABI's are likely falsely elevated at 0.66 with noncompressible arteries. We have been consult for possible arterial intervention and to determine if the third toe amputation site will heal.    Past medical history : DM managed with Metformin and HTN managed with Norvasc, and seizures managed with Keppra.   He denise CAD and hypercholesterolemia.       Current Facility-Administered Medications  Medication Dose Route Frequency Provider Last Rate Last Dose  . 0.45 % sodium chloride infusion   Intravenous Continuous Osvaldo Shipper, MD 75 mL/hr at 02/13/18 0127    . acetaminophen (TYLENOL) tablet 650 mg  650 mg Oral Q6H PRN Hillary Bow, DO       Or  . acetaminophen (TYLENOL) suppository 650 mg  650 mg Rectal Q6H PRN Hillary Bow, DO      . amLODipine (NORVASC) tablet 5 mg  5 mg Oral Daily Lyda Perone M, DO   5 mg at 02/12/18 1055  . cefTRIAXone (ROCEPHIN) 1 g in sodium chloride 0.9 % 100 mL IVPB  1 g Intravenous Q24H Hillary Bow, DO   Stopped at 02/13/18 0530  . enoxaparin (LOVENOX) injection 40 mg  40 mg Subcutaneous Q24H Lyda Perone M, DO   40 mg at 02/12/18 1055  . folic acid (FOLVITE) tablet 1 mg  1 mg Oral Daily Lyda Perone M, DO   1 mg at 02/12/18 1054  . gabapentin (NEURONTIN) capsule 600 mg  600 mg Oral BID Hillary Bow, DO   600 mg at 02/12/18 2008   . insulin aspart (novoLOG) injection 0-9 Units  0-9 Units Subcutaneous TID WC Hillary Bow, DO   3 Units at 02/13/18 0900  . levETIRAcetam (KEPPRA) tablet 500 mg  500 mg Oral BID Hillary Bow, DO   500 mg at 02/12/18 2009  . metroNIDAZOLE (FLAGYL) IVPB 500 mg  500 mg Intravenous Q8H Hillary Bow, DO   Stopped at 02/13/18 0226  . morphine 4 MG/ML injection 2-4 mg  2-4 mg Intravenous Q4H PRN Osvaldo Shipper, MD   2 mg at 02/13/18 0836  . ondansetron (ZOFRAN) tablet 4 mg  4 mg Oral Q6H PRN Hillary Bow, DO       Or  . ondansetron [facility]) injection 4 mg  4 mg Intravenous Q6H PRN Hillary Bow, DO      . tiZANidine (ZANAFLEX) tablet 8 mg  8 mg Oral Q8H PRN Hillary Bow, DO        Pt meds include: Statin :No Betablocker: No ASA: No Other anticoagulants/antiplatelets: none  Past Medical History:  Diagnosis Date  . Diabetes mellitus without complication (HCC)   . MI, old     History reviewed. No pertinent surgical history.  Social History Social History   Tobacco Use  . Smoking status: Never Smoker  .  Smokeless tobacco: Former Engineer, waterUser  Substance Use Topics  . Alcohol use: No    Frequency: Never  . Drug use: No    Family History Family History  Problem Relation Age of Onset  . Cancer Father   . Diabetes Neg Hx     No Known Allergies   REVIEW OF SYSTEMS  General: [ ]  Weight loss, [ ]  Fever, [ ]  chills Neurologic: [ ]  Dizziness, [ ]  Blackouts, [x ] Seizure [ ]  Stroke, [ ]  "Mini stroke", [ ]  Slurred speech, [ ]  Temporary blindness; [ ]  weakness in arms or legs, [ ]  Hoarseness [ ]  Dysphagia Cardiac: [ ]  Chest pain/pressure, [ ]  Shortness of breath at rest [ ]  Shortness of breath with exertion, [ ]  Atrial fibrillation or irregular heartbeat  Vascular: [x ] Pain in legs with walking, [x ] Pain in legs at rest, [ ]  Pain in legs at night,  [ ]  Non-healing ulcer, [ ]  Blood clot in vein/DVT,   Pulmonary: [ ]  Home oxygen, [ ]  Productive cough, [ ]  Coughing up  blood, [ ]  Asthma,  [ ]  Wheezing [ ]  COPD Musculoskeletal:  [ ]  Arthritis, [ ]  Low back pain, [ ]  Joint pain Hematologic: [ ]  Easy Bruising, [ ]  Anemia; [ ]  Hepatitis Gastrointestinal: [ ]  Blood in stool, [ ]  Gastroesophageal Reflux/heartburn, Urinary: [ ]  chronic Kidney disease, [ ]  on HD - [ ]  MWF or [ ]  TTHS, [ ]  Burning with urination, [ ]  Difficulty urinating Skin: [ ]  Rashes, [ ]  Wounds Psychological: [ ]  Anxiety, [ ]  Depression  Physical Examination Vitals:   02/12/18 0800 02/12/18 1200 02/12/18 2149 02/13/18 0428  BP: 123/66 124/68 129/69 128/68  Pulse: 92 87 98 99  Resp: 18 18 18 19   Temp: 98.1 F (36.7 C) 98 F (36.7 C) 99 F (37.2 C) 99 F (37.2 C)  TempSrc: Oral Oral Oral Oral  SpO2: 100% 100% 100% 100%  Weight:      Height:       Body mass index is 21.81 kg/m.  General:  WDWN in NAD HENT: WNL Eyes: Pupils equal Pulmonary: normal non-labored breathing , without Rales, rhonchi,  wheezing Cardiac: RRR, without  Murmurs, rubs or gallops; No carotid bruits Abdomen: soft, NT, no masses Skin: no rashes, ulcers noted;  Positive dry Gangrene right third toe with cellulitis on the dorsum of the foot; no open wounds;   Vascular Exam/Pulses:Palpable radial , left femoral> right femoral absent pedal pulses   Musculoskeletal: no muscle wasting or atrophy; positive mild edema Right foot Neurologic: A&O X 3; Appropriate Affect ;  SENSATION: normal; MOTOR FUNCTION: 5/5 Symmetric, decreased right LE secondary to pain. Speech is fluent/normal   Significant Diagnostic Studies: CBC Lab Results  Component Value Date   WBC 7.6 02/11/2018   HGB 12.6 (L) 02/11/2018   HCT 37.0 (L) 02/11/2018   MCV 75.6 (L) 02/11/2018   PLT 305 02/11/2018    BMET    Component Value Date/Time   NA 135 02/11/2018 2331   K 4.3 02/11/2018 2331   CL 97 (L) 02/11/2018 2331   CO2 22 02/11/2018 2318   GLUCOSE 316 (H) 02/11/2018 2331   BUN 23 (H) 02/11/2018 2331   CREATININE 1.30 (H)  02/11/2018 2331   CALCIUM 9.7 02/11/2018 2318   GFRNONAA 52 (L) 02/11/2018 2318   GFRAA >60 02/11/2018 2318   Estimated Creatinine Clearance: 47.4 mL/min (A) (by C-G formula based on SCr of 1.3 mg/dL (H)).  COAG No results  found for: INR, PROTIME   Non-Invasive Vascular Imaging:  ABI's right non compressible PT, monophasic flow 0.46 Left non compressible monophasic flow 0.66  ASSESSMENT/PLAN:  PAD with right third toe ischemic changes > 1 month.   Pain and cellulitis.  His ABI's reveal evidence of sever tibial disease.  He has a weak palpable right femoral pulse which may also suggest iliac disease as well.    Plan angiogram tomorrow by Dr. Darrick Penna with possible intervention.  NPO past MN.  Post procedure he will have to hold his Metformin and be placed on a sliding scale due to use of contrast dye.  His current Cr/GFR is 1.4/>60.   Mosetta Pigeon 02/13/2018 10:47 AM  I agree with the above.  I have seen and evaluated the patient. He was seen yesterday in consultation by Dr. Ophelia Charter for a gangrenous right third toe.  He had vascular lab studies that showed suboptimal blood flow for wound healing to his right leg.  I discussed the limb threatening nature of the situation.  In order to optimize his attempts at limb salvage, we need to proceed with angiography.  This will be through a left femoral approach.  Intervention will be performed if indicated.  He will be n.p.o. after midnight with procedure scheduled for Friday, February 22  Durene Cal

## 2018-02-14 ENCOUNTER — Ambulatory Visit (HOSPITAL_COMMUNITY): Admit: 2018-02-14 | Payer: PPO | Admitting: Vascular Surgery

## 2018-02-14 ENCOUNTER — Inpatient Hospital Stay (HOSPITAL_COMMUNITY)
Admission: EM | Disposition: A | Discharge status: Home / Self Care | Payer: Self-pay | Source: Home / Self Care | Attending: Internal Medicine

## 2018-02-14 HISTORY — PX: ABDOMINAL AORTOGRAM W/LOWER EXTREMITY: CATH118223

## 2018-02-14 HISTORY — PX: PERIPHERAL VASCULAR BALLOON ANGIOPLASTY: CATH118281

## 2018-02-14 LAB — GLUCOSE, CAPILLARY
GLUCOSE-CAPILLARY: 98 mg/dL (ref 65–99)
Glucose-Capillary: 142 mg/dL — ABNORMAL HIGH (ref 65–99)
Glucose-Capillary: 90 mg/dL (ref 65–99)
Glucose-Capillary: 93 mg/dL (ref 65–99)

## 2018-02-14 LAB — CBC
HCT: 28.1 % — ABNORMAL LOW (ref 39.0–52.0)
HEMOGLOBIN: 8.7 g/dL — AB (ref 13.0–17.0)
MCH: 23.6 pg — AB (ref 26.0–34.0)
MCHC: 31 g/dL (ref 30.0–36.0)
MCV: 76.4 fL — ABNORMAL LOW (ref 78.0–100.0)
PLATELETS: 282 10*3/uL (ref 150–400)
RBC: 3.68 MIL/uL — ABNORMAL LOW (ref 4.22–5.81)
RDW: 12.4 % (ref 11.5–15.5)
WBC: 7.3 10*3/uL (ref 4.0–10.5)

## 2018-02-14 LAB — PROTIME-INR
INR: 1.22
PROTHROMBIN TIME: 15.3 s — AB (ref 11.4–15.2)

## 2018-02-14 LAB — POCT ACTIVATED CLOTTING TIME
ACTIVATED CLOTTING TIME: 213 s
Activated Clotting Time: 230 seconds
Activated Clotting Time: 153 seconds

## 2018-02-14 LAB — BASIC METABOLIC PANEL
Anion gap: 12 (ref 5–15)
BUN: 14 mg/dL (ref 6–20)
CALCIUM: 9.3 mg/dL (ref 8.9–10.3)
CO2: 21 mmol/L — AB (ref 22–32)
CREATININE: 1.25 mg/dL — AB (ref 0.61–1.24)
Chloride: 101 mmol/L (ref 101–111)
GFR calc Af Amer: >60 mL/min (ref >60)
GFR calc non Af Amer: >60 mL/min (ref >60)
GLUCOSE: 149 mg/dL — AB (ref 65–99)
Potassium: 3.7 mmol/L (ref 3.5–5.1)
Sodium: 134 mmol/L — ABNORMAL LOW (ref 135–145)

## 2018-02-14 LAB — MRSA PCR SCREENING: MRSA BY PCR: NEGATIVE

## 2018-02-14 SURGERY — ABDOMINAL AORTOGRAM W/LOWER EXTREMITY
Anesthesia: LOCAL | Laterality: Right

## 2018-02-14 MED ORDER — SODIUM CHLORIDE 0.9 % IV SOLN
INTRAVENOUS | Status: AC | PRN
  Administered 2018-02-14: 100 mL/h via INTRAVENOUS

## 2018-02-14 MED ORDER — ASPIRIN EC 325 MG PO TBEC
325.0000 mg | DELAYED_RELEASE_TABLET | Freq: Every day | ORAL | Status: DC
  Administered 2018-02-14 – 2018-02-21 (×8): 325 mg via ORAL
  Filled 2018-02-14 (×8): qty 1

## 2018-02-14 MED ORDER — ONDANSETRON HCL 4 MG/2ML IJ SOLN
4.0000 mg | Freq: Four times a day (QID) | INTRAMUSCULAR | Status: DC | PRN
  Administered 2018-02-14 – 2018-02-17 (×4): 4 mg via INTRAVENOUS
  Filled 2018-02-14 (×4): qty 2

## 2018-02-14 MED ORDER — CLOPIDOGREL BISULFATE 75 MG PO TABS
75.0000 mg | ORAL_TABLET | Freq: Every day | ORAL | Status: DC
  Administered 2018-02-15 – 2018-02-22 (×8): 75 mg via ORAL
  Filled 2018-02-14 (×9): qty 1

## 2018-02-14 MED ORDER — IODIXANOL 320 MG/ML IV SOLN
INTRAVENOUS | Status: DC | PRN
  Administered 2018-02-14: 155 mL via INTRA_ARTERIAL

## 2018-02-14 MED ORDER — SODIUM CHLORIDE 0.9 % IV SOLN
INTRAVENOUS | Status: DC
  Administered 2018-02-14 – 2018-02-16 (×2): via INTRAVENOUS

## 2018-02-14 MED ORDER — ACETAMINOPHEN 325 MG PO TABS
650.0000 mg | ORAL_TABLET | ORAL | Status: DC | PRN
  Administered 2018-02-15 – 2018-02-19 (×3): 650 mg via ORAL
  Filled 2018-02-14 (×2): qty 2

## 2018-02-14 MED ORDER — HEPARIN SODIUM (PORCINE) 1000 UNIT/ML IJ SOLN
INTRAMUSCULAR | Status: AC
  Filled 2018-02-14: qty 1

## 2018-02-14 MED ORDER — SODIUM CHLORIDE 0.9 % IV SOLN
INTRAVENOUS | Status: AC
  Administered 2018-02-14: 21:00:00 via INTRAVENOUS

## 2018-02-14 MED ORDER — SIMVASTATIN 10 MG PO TABS: 10.0000 mg | ORAL_TABLET | Freq: Every day | ORAL | Status: DC

## 2018-02-14 MED ORDER — SODIUM CHLORIDE 0.9% FLUSH
3.0000 mL | Freq: Two times a day (BID) | INTRAVENOUS | Status: DC
  Administered 2018-02-14 – 2018-02-22 (×13): 3 mL via INTRAVENOUS

## 2018-02-14 MED ORDER — SODIUM CHLORIDE 0.9 % IV SOLN: 250.0000 mL | INTRAVENOUS | Status: DC | PRN

## 2018-02-14 MED ORDER — LABETALOL HCL 5 MG/ML IV SOLN: 10.0000 mg | INTRAVENOUS | Status: DC | PRN

## 2018-02-14 MED ORDER — HEPARIN SODIUM (PORCINE) 1000 UNIT/ML IJ SOLN
INTRAMUSCULAR | Status: DC | PRN
  Administered 2018-02-14: 6000 [IU] via INTRAVENOUS

## 2018-02-14 MED ORDER — CLOPIDOGREL BISULFATE 75 MG PO TABS
300.0000 mg | ORAL_TABLET | Freq: Once | ORAL | Status: AC
  Administered 2018-02-14: 22:00:00 300 mg via ORAL
  Filled 2018-02-14: qty 4

## 2018-02-14 MED ORDER — LIDOCAINE HCL 1 % IJ SOLN
INTRAMUSCULAR | Status: AC
  Filled 2018-02-14: qty 20

## 2018-02-14 MED ORDER — OXYCODONE HCL 5 MG PO TABS
5.0000 mg | ORAL_TABLET | ORAL | Status: DC | PRN
  Administered 2018-02-16 – 2018-02-22 (×19): 10 mg via ORAL
  Filled 2018-02-14 (×20): qty 2

## 2018-02-14 MED ORDER — LIDOCAINE HCL (PF) 1 % IJ SOLN
INTRAMUSCULAR | Status: DC | PRN
  Administered 2018-02-14: 2 mL

## 2018-02-14 MED ORDER — MORPHINE SULFATE (PF) 4 MG/ML IV SOLN
2.0000 mg | INTRAVENOUS | Status: DC | PRN
  Administered 2018-02-14 – 2018-02-19 (×7): 2 mg via INTRAVENOUS
  Filled 2018-02-14 (×7): qty 1

## 2018-02-14 MED ORDER — HYDRALAZINE HCL 20 MG/ML IJ SOLN: 5.0000 mg | INTRAMUSCULAR | Status: DC | PRN

## 2018-02-14 MED ORDER — HEPARIN (PORCINE) IN NACL 2-0.9 UNIT/ML-% IJ SOLN
INTRAMUSCULAR | Status: AC | PRN
  Administered 2018-02-14 (×2): 500 mL

## 2018-02-14 MED ORDER — SODIUM CHLORIDE 0.9% FLUSH: 3.0000 mL | INTRAVENOUS | Status: DC | PRN

## 2018-02-14 SURGICAL SUPPLY — 16 items
BALLN IN.PACT DCB 5X40 (BALLOONS) ×3
BALLN MUSTANG 5.0X40 135 (BALLOONS) ×3
BALLOON MUSTANG 5.0X40 135 (BALLOONS) ×2 IMPLANT
CATH ANGIO 5F PIGTAIL 65CM (CATHETERS) ×3 IMPLANT
CATH TEMPO 5F RIM 65CM (CATHETERS) ×3 IMPLANT
DCB IN.PACT 5X40 (BALLOONS) ×2 IMPLANT
KIT ENCORE 26 ADVANTAGE (KITS) ×3 IMPLANT
KIT PV (KITS) ×3 IMPLANT
SHEATH HIGHFLEX ANSEL 7FR 55CM (SHEATH) ×3 IMPLANT
SHEATH PINNACLE 5F 10CM (SHEATH) ×3 IMPLANT
SHEATH PINNACLE 7F 10CM (SHEATH) ×3 IMPLANT
SYR MEDRAD MARK V 150ML (SYRINGE) ×3 IMPLANT
TRANSDUCER W/STOPCOCK (MISCELLANEOUS) ×3 IMPLANT
TRAY PV CATH (CUSTOM PROCEDURE TRAY) ×3 IMPLANT
WIRE BENTSON .035X145CM (WIRE) ×6 IMPLANT
WIRE HI TORQ VERSACORE J 260CM (WIRE) ×3 IMPLANT

## 2018-02-14 NOTE — Interval H&P Note (Signed)
History and Physical Interval Note:  02/14/2018 4:33 PM  Luke Manning  has presented today for surgery, with the diagnosis of pvd w/grangren right foot  The various methods of treatment have been discussed with the patient and family. After consideration of risks, benefits and other options for treatment, the patient has consented to  Procedure(s): ABDOMINAL AORTOGRAM W/LOWER EXTREMITY (N/A) as a surgical intervention .  The patient's history has been reviewed, patient examined, no change in status, stable for surgery.  I have reviewed the patient's chart and labs.  Questions were answered to the patient's satisfaction.     Fabienne Brunsharles Brahm Barbeau

## 2018-02-14 NOTE — Op Note (Signed)
Procedure: Abdominal aortogram with bilateral lower extremity runoff, angioplasty of right superficial femoral artery (5 x 40 Admiral Impact)  Preoperative diagnosis: Nonhealing wound right foot  Postoperative diagnosis: Same  Anesthesia: Local  Operative findings: Number 170% stenosis right mid superficial femoral artery angioplasty with drug-eluting balloon to 0 residual stenosis  2.  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  The patient was placed in supine position Angio table.  Both groins were prepped and draped in usual sterile fashion.  Local anesthesia was infiltrated over the left common femoral artery.  An introducer needle was used to cannulate the left common femoral artery.  An 035 versacore wire threaded in the abdominal aorta under fluoroscopic guidance.  Next a 5 French sheath was placed over the guidewire and left common femoral artery.  This was thoroughly flushed with heparinized saline.  A 5 French pigtail catheter was then advanced over the guidewire and abdominal aortogram was obtained in AP projection.  The left and right renal arteries are patent.  The infrarenal abdominal aorta is patent.  The left and right common external and internal iliac arteries are patent.  Pigtail catheter was pulled down just above the aortic bifurcation and an oblique view of the pelvis was obtained to confirm the above findings.  Next bilateral lower extremity runoff views were obtained through the pigtail catheter.  In the left lower extremity, the left common femoral and profunda femoris is patent.  There is a mid 90% stenosis of the left superficial femoral artery.  Popliteal artery is patent.  There is three-vessel runoff to the left foot.  However there are multiple segments  of narrowing in the anterior tibial artery in the left leg.  In the right lower extremity, the right common femoral profunda femoris is patent.  There is a 70% stenosis of the right mid superficial femoral artery.  Popliteal artery is patent.  There is three-vessel runoff to the right foot.  However there are multiple segments of narrowing in the anterior tibial artery.  The posterior tibial artery continues all the way to the ankle and then occludes.  The peroneal artery is the single runoff vessel to the right foot.  However, there is filling completely of the anterior tibial artery.  At this point I decided to intervene on the 70% right superficial femoral artery stenosis.  The 5 French sheath was exchanged over a guidewire for a 7 [place] flex sheath.  Patient was given 6000 units of intravenous heparin.  A rim catheter was used to selectively catheterize the right common iliac and the guidewire advanced into the right mid superficial femoral artery.  The flex sheath was advanced over this into the right superficial femoral artery.  An 035 versacore wire was then easily advanced across the stenosis and a 5 x 40 angioplasty balloon was inflated to nominal pressure to prep the lesion in the mid SFA.  This was then followed by a 5 x 40 Impact drug-eluting balloon.  This was inflated to 3 minutes for nominal pressure.  Completion angiogram showed wide patency of the right superficial femoral artery.  Runoff was intact with similar findings as described above.  At this point the sheath was pulled back up and over the aortic bifurcation and swept out over guidewire for a short 7 [place] sheath  to be pulled after the ACT is less than 175.  Operative management: The patient now has a widely patent right superficial femoral artery.  This should be adequate perfusion for wound healing of the right foot.  However, if there is difficulty consideration could be given for angioplasty of the distal right anterior tibial  artery however I am not sure if this would improve perfusion significantly overall from where it currently stands.  I will discuss this further with Dr. Myra GianottiBrabham.  Luke Brunsharles Cheryal Salas, MD [facility] [place] Office: [phone] Pager: [phone]

## 2018-02-14 NOTE — Progress Notes (Signed)
PROGRESS NOTE  Luke Manning ZOX:[id number] DOB: 07/05/1961 DOA: 02/11/2018 PCP: Jackie Plumsei-Bonsu, George, MD  HPI/Recap of past 2824 hours: 57 year old African-American male with past medical history of diabetes mellitus type 2 on oral medications presented with complains of pain in the right third toe, ongoing for the past 1 month. Pt went to see his podiatrist who recommended that he come into the hospital for further evaluation. In the ED, toe noted to be gangrenous. Pt admitted for further management.  Today, pt denies any new compliants, no chest pain, SOB, abdominal pain, N/V, fever/chills. Pt scheduled for angiogram today   Assessment/Plan: Principal Problem:   Gangrene of toe of right foot (HCC) Active Problems:   DM2 (diabetes mellitus, type 2) (HCC)   HTN (hypertension)   Seizure disorder (HCC)  R foot cellulitis with gangrenous 3rd toe Afebrile, no leukocytosis Foot xray, no concern for osteomyelitis ABI abnormal, R ABI 0.46, vascular surgery consulted Plan for angiogram with possible intervention on 02/14/18 Ortho consulted, surgery pending angiogram Continue ceftriaxone and metronidazole  Diabetes mellitus type 2 Uncontrolled, A1c 11.4 SSI, lantus 10U @ bedtime Will discharge pt on insulin and plan to d/c metformin Hypoglycemic protocol  Microcytic anemia Acute drop in hemoglobin, no signs of bleeding Will order Iron panel, FOBT pending Daily CBC  Essential hypertension Stable Continue amlodipine  History of seizure disorder Continue Keppra   Code Status: Full  Family Communication: None at bedside  Disposition Plan: TBD    Consultants:  Vascular  Orthopedics  Procedures:  None  Antimicrobials:  IV ceftriaxone  IV metronidazole  DVT prophylaxis:  Lovenox   Objective: Vitals:   02/13/18 2118 02/14/18 0508 02/14/18 1408 02/14/18 1651  BP: (!) 147/86 140/87 125/75   Pulse: 98 91 86   Resp: 18 18 18    Temp: 99.6 F (37.6 C) 98.3 F (36.8  C) 98.2 F (36.8 C)   TempSrc: Oral Oral Oral   SpO2: 100% 100% 100% 100%  Weight:      Height:        Intake/Output Summary (Last 24 hours) at 02/14/2018 1701 Last data filed at 02/14/2018 1500 Gross per 24 hour  Intake 180 ml  Output 1700 ml  Net -1520 ml   Filed Weights   02/12/18 0226  Weight: 54.1 kg (119 lb 4.3 oz)    Exam:   General:  NAD  Cardiovascular: S1, S2 present  Respiratory: CTA  Abdomen: Soft, NT, ND, BS+  Musculoskeletal: Dry gangrenous R 3rd toe, poor pulses, no pedal edema b/l  Skin: Normal  Psychiatry: Normal mood   Data Reviewed: CBC: Recent Labs  Lab 02/11/18 2317 02/11/18 2331 02/14/18 0633  WBC 7.6  --  7.3  NEUTROABS 4.0  --   --   HGB 10.1* 12.6* 8.7*  HCT 31.3* 37.0* 28.1*  MCV 75.6*  --  76.4*  PLT 305  --  282   Basic Metabolic Panel: Recent Labs  Lab 02/11/18 2318 02/11/18 2331 02/14/18 0633  NA 134* 135 134*  K 4.3 4.3 3.7  CL 98* 97* 101  CO2 22  --  21*  GLUCOSE 319* 316* 149*  BUN 21* 23* 14  CREATININE 1.44* 1.30* 1.25*  CALCIUM 9.7  --  9.3   GFR: Estimated Creatinine Clearance: 49.3 mL/min (A) (by C-G formula based on SCr of 1.25 mg/dL (H)). Liver Function Tests: Recent Labs  Lab 02/11/18 2318  AST 21  ALT 17  ALKPHOS 66  BILITOT 0.6  PROT 8.9*  ALBUMIN 4.3  No results for input(s): LIPASE, AMYLASE in the last 168 hours. No results for input(s): AMMONIA in the last 168 hours. Coagulation Profile: Recent Labs  Lab 02/14/18 0633  INR 1.22   Cardiac Enzymes: No results for input(s): CKTOTAL, CKMB, CKMBINDEX, TROPONINI in the last 168 hours. BNP (last 3 results) No results for input(s): PROBNP in the last 8760 hours. HbA1C: Recent Labs    02/12/18 0239  HGBA1C 11.4*   CBG: Recent Labs  Lab 02/13/18 1157 02/13/18 1717 02/13/18 2116 02/14/18 0748 02/14/18 1204  GLUCAP 126* 198* 141* 142* 90   Lipid Profile: No results for input(s): CHOL, HDL, LDLCALC, TRIG, CHOLHDL, LDLDIRECT  in the last 72 hours. Thyroid Function Tests: No results for input(s): TSH, T4TOTAL, FREET4, T3FREE, THYROIDAB in the last 72 hours. Anemia Panel: No results for input(s): VITAMINB12, FOLATE, FERRITIN, TIBC, IRON, RETICCTPCT in the last 72 hours. Urine analysis: No results found for: COLORURINE, APPEARANCEUR, LABSPEC, PHURINE, GLUCOSEU, HGBUR, BILIRUBINUR, KETONESUR, PROTEINUR, UROBILINOGEN, NITRITE, LEUKOCYTESUR Sepsis Labs: @LABRCNTIP (procalcitonin:4,lacticidven:4)  ) Recent Results (from the past 240 hour(s))  Blood Cultures x 2 sites     Status: None (Preliminary result)   Collection Time: 02/12/18  2:39 AM  Result Value Ref Range Status   Specimen Description   Final    BLOOD LEFT ARM Performed at [facility], [address] [place], [place] [address]    Special Requests   Final    IN PEDIATRIC BOTTLE Blood Culture adequate volume Performed at [facility], [address], [place], [place] [address]    Culture   Final    NO GROWTH 2 DAYS Performed at [facility], [address] [place], [place] [address]    Report Status PENDING  Incomplete  Blood Cultures x 2 sites     Status: None (Preliminary result)   Collection Time: 02/12/18  2:39 AM  Result Value Ref Range Status   Specimen Description   Final    BLOOD RIGHT ANTECUBITAL Performed at [facility], [address], [place], [place] [address]    Special Requests   Final    BOTTLES DRAWN AEROBIC AND ANAEROBIC Blood Culture adequate volume Performed at [facility], [address], [place], [place] [address]    Culture   Final    NO GROWTH 2 DAYS Performed at [facility], [address] [place], [place] [address]    Report Status PENDING  Incomplete  MRSA PCR Screening     Status: None   Collection Time: 02/13/18  7:48 PM  Result Value Ref Range Status   MRSA by PCR NEGATIVE NEGATIVE Final    Comment:        The  GeneXpert MRSA Assay (FDA approved for NASAL specimens only), is one component of a comprehensive MRSA colonization surveillance program. It is not intended to diagnose MRSA infection nor to guide or monitor treatment for MRSA infections. Performed at [facility], [address] [place], [place] [address]       Studies: No results found.  Scheduled Meds: . [MAR Hold] amLODipine  5 mg Oral Daily  . [MAR Hold] enoxaparin (LOVENOX) injection  40 mg Subcutaneous Q24H  . [MAR Hold] folic acid  1 mg Oral Daily  . [MAR Hold] gabapentin  600 mg Oral BID  . [MAR Hold] insulin aspart  0-9 Units Subcutaneous TID WC  . [MAR Hold] insulin glargine  10 Units Subcutaneous QHS  . [facility] Hold]  levETIRAcetam  500 mg Oral BID  . [MAR Hold] nutrition supplement (JUVEN)  1 packet Oral BID BM    Continuous Infusions: . sodium chloride 100 mL/hr at 02/14/18 1610  . [MAR Hold] cefTRIAXone (ROCEPHIN)  IV Stopped (02/14/18 0703)  . heparin    . [MAR Hold] metronidazole Stopped (02/14/18 0932)     LOS: 2 days     Briant Cedar, MD [facility]   If 7PM-7AM, please contact night-coverage [email or web address] Password [facility] 02/14/2018, 5:01 PM

## 2018-02-15 LAB — CBC WITH DIFFERENTIAL/PLATELET
Basophils Absolute: 0 10*3/uL (ref 0.0–0.1)
Basophils Relative: 0 %
EOS ABS: 0.1 10*3/uL (ref 0.0–0.7)
EOS PCT: 1 %
HCT: 25.5 % — ABNORMAL LOW (ref 39.0–52.0)
Hemoglobin: 7.8 g/dL — ABNORMAL LOW (ref 13.0–17.0)
LYMPHS ABS: 1.3 10*3/uL (ref 0.7–4.0)
Lymphocytes Relative: 15 %
MCH: 23.4 pg — AB (ref 26.0–34.0)
MCHC: 30.6 g/dL (ref 30.0–36.0)
MCV: 76.3 fL — ABNORMAL LOW (ref 78.0–100.0)
Monocytes Absolute: 0.6 10*3/uL (ref 0.1–1.0)
Monocytes Relative: 7 %
NEUTROS PCT: 77 %
Neutro Abs: 6.5 10*3/uL (ref 1.7–7.7)
PLATELETS: 265 10*3/uL (ref 150–400)
RBC: 3.34 MIL/uL — AB (ref 4.22–5.81)
RDW: 12.6 % (ref 11.5–15.5)
WBC: 8.5 10*3/uL (ref 4.0–10.5)

## 2018-02-15 LAB — BASIC METABOLIC PANEL
Anion gap: 9 (ref 5–15)
BUN: 13 mg/dL (ref 6–20)
CO2: 21 mmol/L — ABNORMAL LOW (ref 22–32)
CREATININE: 1.15 mg/dL (ref 0.61–1.24)
Calcium: 8.6 mg/dL — ABNORMAL LOW (ref 8.9–10.3)
Chloride: 103 mmol/L (ref 101–111)
Glucose, Bld: 149 mg/dL — ABNORMAL HIGH (ref 65–99)
POTASSIUM: 3.8 mmol/L (ref 3.5–5.1)
SODIUM: 133 mmol/L — AB (ref 135–145)

## 2018-02-15 LAB — IRON AND TIBC
Iron: 64 ug/dL (ref 45–182)
Saturation Ratios: 32 % (ref 17.9–39.5)
TIBC: 200 ug/dL — AB (ref 250–450)
UIBC: 136 ug/dL

## 2018-02-15 LAB — GLUCOSE, CAPILLARY
GLUCOSE-CAPILLARY: 109 mg/dL — AB (ref 65–99)
Glucose-Capillary: 104 mg/dL — ABNORMAL HIGH (ref 65–99)
Glucose-Capillary: 141 mg/dL — ABNORMAL HIGH (ref 65–99)

## 2018-02-15 LAB — FERRITIN: Ferritin: 549 ng/mL — ABNORMAL HIGH (ref 24–336)

## 2018-02-15 MED ORDER — ANGIOPLASTY BOOK
Freq: Once | Status: AC
  Administered 2018-02-15: 04:00:00
  Filled 2018-02-15: qty 1

## 2018-02-15 MED ORDER — ATORVASTATIN CALCIUM 40 MG PO TABS
40.0000 mg | ORAL_TABLET | Freq: Every day | ORAL | Status: DC
  Administered 2018-02-15: 40 mg via ORAL
  Filled 2018-02-15: qty 1

## 2018-02-15 NOTE — Progress Notes (Signed)
PROGRESS NOTE  Rob Hickmanlvin Beamon [id number]:[id number] DOB: 01/22/1961 DOA: 02/11/2018 PCP: Jackie Plumsei-Bonsu, George, MD  HPI/Recap of past 2724 hours: 57 year old African-American male with past medical history of diabetes mellitus type 2 on oral medications presented with complains of pain in the right third toe, ongoing for the past 1 month. Pt went to see his podiatrist who recommended that he come into the hospital for further evaluation. In the ED, toe noted to be gangrenous. Pt admitted for further management.  Today, pt reports nausea, but denies vomiting. Denies chest pain, SOB, abdominal pain, fever/chills.  Assessment/Plan: Principal Problem:   Gangrene of toe of right foot (HCC) Active Problems:   DM2 (diabetes mellitus, type 2) (HCC)   HTN (hypertension)   Seizure disorder (HCC)  R foot cellulitis with gangrenous 3rd toe Afebrile, no leukocytosis Foot xray, no concern for osteomyelitis Ortho pending amputation Continue ceftriaxone and metronidazole  PVD ABI abnormal, R ABI 0.46, vascular surgery on board Had angiogram with successful balloon angioplasty to the R superficial femoral artery on 02/14/18 Start ASA + Plavix + statin  Diabetes mellitus type 2 Uncontrolled, A1c 11.4 SSI, lantus 10U @ bedtime Will discharge pt on insulin and plan to d/c metformin Hypoglycemic protocol  Microcytic anemia Acute drop in hemoglobin, no signs of bleeding Iron panel: Fe 64, TIBC 200, sats 32, ferritin 549, AOCD FOBT pending Daily CBC  Essential hypertension Stable Continue amlodipine  History of seizure disorder Continue Keppra   Code Status: Full  Family Communication: None at bedside  Disposition Plan: TBD    Consultants:  Vascular  Orthopedics  Procedures:  None  Antimicrobials:  IV ceftriaxone  IV metronidazole  DVT prophylaxis:  Lovenox   Objective: Vitals:   02/15/18 0330 02/15/18 0535 02/15/18 0700 02/15/18 1434  BP: 128/70 131/79 125/80 129/78  Pulse:  94  100 98  Resp: 16 17 20 19   Temp: 98.3 F (36.8 C)  98.7 F (37.1 C) (!) 97.5 F (36.4 C)  TempSrc: Oral  Oral Oral  SpO2: 97% 97% 100% 100%  Weight: 59.5 kg (131 lb 2.8 oz)     Height:        Intake/Output Summary (Last 24 hours) at 02/15/2018 1527 Last data filed at 02/15/2018 1226 Gross per 24 hour  Intake 340 ml  Output 1800 ml  Net -1460 ml   Filed Weights   02/12/18 0226 02/15/18 0330  Weight: 54.1 kg (119 lb 4.3 oz) 59.5 kg (131 lb 2.8 oz)    Exam:   General:  NAD  Cardiovascular: S1, S2 present  Respiratory: CTA  Abdomen: Soft, NT, ND, BS+  Musculoskeletal: Dry gangrenous R 3rd toe, no pedal edema b/l  Skin: Normal  Psychiatry: Normal mood   Data Reviewed: CBC: Recent Labs  Lab 02/11/18 2317 02/11/18 2331 02/14/18 0633 02/15/18 0309  WBC 7.6  --  7.3 8.5  NEUTROABS 4.0  --   --  6.5  HGB 10.1* 12.6* 8.7* 7.8*  HCT 31.3* 37.0* 28.1* 25.5*  MCV 75.6*  --  76.4* 76.3*  PLT 305  --  282 265   Basic Metabolic Panel: Recent Labs  Lab 02/11/18 2318 02/11/18 2331 02/14/18 0633 02/15/18 0309  NA 134* 135 134* 133*  K 4.3 4.3 3.7 3.8  CL 98* 97* 101 103  CO2 22  --  21* 21*  GLUCOSE 319* 316* 149* 149*  BUN 21* 23* 14 13  CREATININE 1.44* 1.30* 1.25* 1.15  CALCIUM 9.7  --  9.3 8.6*   GFR: Estimated  Creatinine Clearance: 54.1 mL/min (by C-G formula based on SCr of 1.15 mg/dL). Liver Function Tests: Recent Labs  Lab 02/11/18 2318  AST 21  ALT 17  ALKPHOS 66  BILITOT 0.6  PROT 8.9*  ALBUMIN 4.3   No results for input(s): LIPASE, AMYLASE in the last 168 hours. No results for input(s): AMMONIA in the last 168 hours. Coagulation Profile: Recent Labs  Lab 02/14/18 0633  INR 1.22   Cardiac Enzymes: No results for input(s): CKTOTAL, CKMB, CKMBINDEX, TROPONINI in the last 168 hours. BNP (last 3 results) No results for input(s): PROBNP in the last 8760 hours. HbA1C: No results for input(s): HGBA1C in the last 72  hours. CBG: Recent Labs  Lab 02/14/18 0748 02/14/18 1204 02/14/18 1839 02/14/18 2135 02/15/18 0637  GLUCAP 142* 90 93 98 104*   Lipid Profile: No results for input(s): CHOL, HDL, LDLCALC, TRIG, CHOLHDL, LDLDIRECT in the last 72 hours. Thyroid Function Tests: No results for input(s): TSH, T4TOTAL, FREET4, T3FREE, THYROIDAB in the last 72 hours. Anemia Panel: Recent Labs    02/15/18 0309  FERRITIN 549*  TIBC 200*  IRON 64   Urine analysis: No results found for: COLORURINE, APPEARANCEUR, LABSPEC, PHURINE, GLUCOSEU, HGBUR, BILIRUBINUR, KETONESUR, PROTEINUR, UROBILINOGEN, NITRITE, LEUKOCYTESUR Sepsis Labs: @LABRCNTIP (procalcitonin:4,lacticidven:4)  ) Recent Results (from the past 240 hour(s))  Blood Cultures x 2 sites     Status: None (Preliminary result)   Collection Time: 02/12/18  2:39 AM  Result Value Ref Range Status   Specimen Description   Final    BLOOD LEFT ARM Performed at [facility], [address], [place], [place] [address]    Special Requests   Final    IN PEDIATRIC BOTTLE Blood Culture adequate volume Performed at [facility], [address], [place], [place] [address]    Culture   Final    NO GROWTH 3 DAYS Performed at [facility], [address] [place], [place] [address]    Report Status PENDING  Incomplete  Blood Cultures x 2 sites     Status: None (Preliminary result)   Collection Time: 02/12/18  2:39 AM  Result Value Ref Range Status   Specimen Description   Final    BLOOD RIGHT ANTECUBITAL Performed at [facility], [address] [place], [place] [address]    Special Requests   Final    BOTTLES DRAWN AEROBIC AND ANAEROBIC Blood Culture adequate volume Performed at [facility], [address] [place], [place] [address]    Culture   Final    NO GROWTH 3 DAYS Performed at [facility], [address], [place], [place] [address]    Report Status  PENDING  Incomplete  MRSA PCR Screening     Status: None   Collection Time: 02/13/18  7:48 PM  Result Value Ref Range Status   MRSA by PCR NEGATIVE NEGATIVE Final    Comment:        The GeneXpert MRSA Assay (FDA approved for NASAL specimens only), is one component of a comprehensive MRSA colonization surveillance program. It is not intended to diagnose MRSA infection nor to guide or monitor treatment for MRSA infections. Performed at [facility], [address] [place], [place] [address]       Studies: No results found.  Scheduled Meds: . amLODipine  5 mg Oral Daily  . aspirin EC  325 mg Oral Daily  . clopidogrel  75 mg Oral Q breakfast  .  enoxaparin (LOVENOX) injection  40 mg Subcutaneous Q24H  . folic acid  1 mg Oral Daily  . gabapentin  600 mg Oral BID  . insulin aspart  0-9 Units Subcutaneous TID WC  . insulin glargine  10 Units Subcutaneous QHS  . levETIRAcetam  500 mg Oral BID  . nutrition supplement (JUVEN)  1 packet Oral BID BM  . simvastatin  10 mg Oral q1800  . sodium chloride flush  3 mL Intravenous Q12H    Continuous Infusions: . sodium chloride 100 mL/hr at 02/14/18 8119  . sodium chloride    . cefTRIAXone (ROCEPHIN)  IV 1 g (02/15/18 0523)  . metronidazole Stopped (02/15/18 1130)     LOS: 3 days     Briant Cedar, MD [facility]   If 7PM-7AM, please contact night-coverage [email or web address] Password [facility] 02/15/2018, 3:27 PM

## 2018-02-15 NOTE — Progress Notes (Signed)
Site area: left groin  Site Prior to Removal:  Level 0  Pressure Applied For 20 MINUTES    Minutes Beginning at 2107  Manual:   Yes.    Patient Status During Pull:  AAOX 4  Post Pull Groin Site:  Level 0  Post Pull Instructions Given:  Yes.    Post Pull Pulses Present:  Yes.    Dressing Applied:  Yes.    Comments: Pt.tolerated procedure well

## 2018-02-15 NOTE — Progress Notes (Signed)
  Progress Note    02/15/2018 11:59 AM 1 Day Post-Op  Subjective:  No overnight issues   Vitals:   02/15/18 0535 02/15/18 0700  BP: 131/79 125/80  Pulse:  100  Resp: 17 20  Temp:  98.7 F (37.1 C)  SpO2: 97% 100%    Physical Exam: aaox3 Left groin is soft, no hematoma Palpable right popliteal pulse Dry gangrene right 3rd toe Strong right peroneal signal and distal dp signal  CBC    Component Value Date/Time   WBC 8.5 02/15/2018 0309   RBC 3.34 (L) 02/15/2018 0309   HGB 7.8 (L) 02/15/2018 0309   HCT 25.5 (L) 02/15/2018 0309   PLT 265 02/15/2018 0309   MCV 76.3 (L) 02/15/2018 0309   MCH 23.4 (L) 02/15/2018 0309   MCHC 30.6 02/15/2018 0309   RDW 12.6 02/15/2018 0309   LYMPHSABS 1.3 02/15/2018 0309   MONOABS 0.6 02/15/2018 0309   EOSABS 0.1 02/15/2018 0309   BASOSABS 0.0 02/15/2018 0309    BMET    Component Value Date/Time   NA 133 (L) 02/15/2018 0309   K 3.8 02/15/2018 0309   CL 103 02/15/2018 0309   CO2 21 (L) 02/15/2018 0309   GLUCOSE 149 (H) 02/15/2018 0309   BUN 13 02/15/2018 0309   CREATININE 1.15 02/15/2018 0309   CALCIUM 8.6 (L) 02/15/2018 0309   GFRNONAA >60 02/15/2018 0309   GFRAA >60 02/15/2018 0309    INR    Component Value Date/Time   INR 1.22 02/14/2018 0633     Intake/Output Summary (Last 24 hours) at 02/15/2018 1159 Last data filed at 02/15/2018 0900 Gross per 24 hour  Intake 340 ml  Output 1900 ml  Net -1560 ml     Assessment:  57 y.o. male is s/p rle revascularization, strong peroneal and dp signals distally  Plan: Should be ok for healing amputation at discretion of orthopedics.  Continue asa/plavix for dcb angioplasty   Quavion Boule C. Randie Heinzain, MD [facility] [place] Office: [phone] Pager: [phone]  02/15/2018 11:59 AM

## 2018-02-15 NOTE — Progress Notes (Signed)
Pt transferred to 6E. Pt oriented to the unit. Pt denies any pain. Pt stated he would call his family and make them aware of his transfer.

## 2018-02-16 LAB — LIPID PANEL
CHOL/HDL RATIO: 2.7 ratio
CHOLESTEROL: 91 mg/dL (ref 0–200)
HDL: 34 mg/dL — AB (ref >40)
LDL Cholesterol: 37 mg/dL (ref 0–99)
Triglycerides: 99 mg/dL (ref <150)
VLDL: 20 mg/dL (ref 0–40)

## 2018-02-16 LAB — CBC WITH DIFFERENTIAL/PLATELET
BASOS ABS: 0 10*3/uL (ref 0.0–0.1)
BASOS PCT: 0 %
EOS ABS: 0.3 10*3/uL (ref 0.0–0.7)
Eosinophils Relative: 3 %
HCT: 23.1 % — ABNORMAL LOW (ref 39.0–52.0)
Hemoglobin: 7 g/dL — ABNORMAL LOW (ref 13.0–17.0)
LYMPHS PCT: 13 %
Lymphs Abs: 1.2 10*3/uL (ref 0.7–4.0)
MCH: 23.3 pg — ABNORMAL LOW (ref 26.0–34.0)
MCHC: 30.3 g/dL (ref 30.0–36.0)
MCV: 77 fL — ABNORMAL LOW (ref 78.0–100.0)
Monocytes Absolute: 0.8 10*3/uL (ref 0.1–1.0)
Monocytes Relative: 8 %
Neutro Abs: 7 10*3/uL (ref 1.7–7.7)
Neutrophils Relative %: 76 %
Platelets: 283 10*3/uL (ref 150–400)
RBC: 3 MIL/uL — AB (ref 4.22–5.81)
RDW: 12.8 % (ref 11.5–15.5)
WBC: 9.3 10*3/uL (ref 4.0–10.5)

## 2018-02-16 LAB — GLUCOSE, CAPILLARY
GLUCOSE-CAPILLARY: 111 mg/dL — AB (ref 65–99)
GLUCOSE-CAPILLARY: 81 mg/dL (ref 65–99)
Glucose-Capillary: 79 mg/dL (ref 65–99)
Glucose-Capillary: 132 mg/dL — ABNORMAL HIGH (ref 65–99)

## 2018-02-16 LAB — BASIC METABOLIC PANEL
ANION GAP: 11 (ref 5–15)
BUN: 18 mg/dL (ref 6–20)
CO2: 21 mmol/L — ABNORMAL LOW (ref 22–32)
Calcium: 8.5 mg/dL — ABNORMAL LOW (ref 8.9–10.3)
Chloride: 105 mmol/L (ref 101–111)
Creatinine, Ser: 1.19 mg/dL (ref 0.61–1.24)
Glucose, Bld: 87 mg/dL (ref 65–99)
POTASSIUM: 3.3 mmol/L — AB (ref 3.5–5.1)
SODIUM: 137 mmol/L (ref 135–145)

## 2018-02-16 LAB — PREPARE RBC (CROSSMATCH)

## 2018-02-16 LAB — ABO/RH: ABO/RH(D): A POS

## 2018-02-16 MED ORDER — MENTHOL 3 MG MT LOZG
1.0000 | LOZENGE | OROMUCOSAL | Status: DC | PRN
  Administered 2018-02-16 – 2018-02-17 (×2): 3 mg via ORAL
  Filled 2018-02-16: qty 9

## 2018-02-16 MED ORDER — ATORVASTATIN CALCIUM 20 MG PO TABS
20.0000 mg | ORAL_TABLET | Freq: Every day | ORAL | Status: DC
  Administered 2018-02-16 – 2018-02-21 (×6): 20 mg via ORAL
  Filled 2018-02-16 (×6): qty 1

## 2018-02-16 MED ORDER — SODIUM CHLORIDE 0.9 % IV SOLN
Freq: Once | INTRAVENOUS | Status: AC
  Administered 2018-02-16: 18:00:00 via INTRAVENOUS

## 2018-02-16 NOTE — Progress Notes (Addendum)
PROGRESS NOTE  Luke Manning ZOX:[id number] DOB: 12/30/1960 DOA: 02/11/2018 PCP: Jackie Plum, MD  HPI/Recap of past 21 hours: 57 year old African-American male with past medical history of diabetes mellitus type 2 on oral medications presented with complains of pain in the right third toe, ongoing for the past 1 month. Pt went to see his podiatrist who recommended that he come into the hospital for further evaluation. In the ED, toe noted to be gangrenous. Pt admitted for further management.  Today, pt worried about possible up coming surgery. Denies chest pain, SOB, abdominal pain, fever/chills, or any signs of bleeding  Assessment/Plan: Principal Problem:   Gangrene of toe of right foot (HCC) Active Problems:   DM2 (diabetes mellitus, type 2) (HCC)   HTN (hypertension)   Seizure disorder (HCC)  R foot cellulitis with gangrenous 3rd toe Afebrile, no leukocytosis Foot xray, no concern for osteomyelitis BC X 2 NGTD Ortho pending amputation Continue ceftriaxone and metronidazole  PVD ABI abnormal, R ABI 0.46, vascular surgery on board Had angiogram with successful balloon angioplasty to the R superficial femoral artery on 02/14/18 Start ASA + Plavix + statin  Diabetes mellitus type 2 Uncontrolled, A1c 11.4 SSI, lantus 10U @ bedtime Will discharge pt on insulin and plan to d/c metformin Hypoglycemic protocol  Microcytic anemia Acute drop in hemoglobin, no signs of bleeding Iron panel: Fe 64, TIBC 200, sats 32, ferritin 549, AOCD FOBT pending, d/c lovenox for now Will transfuse 1U of PRBC Daily CBC  Essential hypertension Stable Continue amlodipine  History of seizure disorder Continue Keppra   Code Status: Full  Family Communication: None at bedside  Disposition Plan: TBD    Consultants:  Vascular  Orthopedics  Procedures:  None  Antimicrobials:  IV ceftriaxone  IV metronidazole  DVT prophylaxis:  Lovenox held, SCD   Objective: Vitals:     02/15/18 2048 02/15/18 2158 02/16/18 0541 02/16/18 1431  BP:  125/76 123/70 126/82  Pulse:    99  Resp:  19 17 (!) 22  Temp: 98.8 F (37.1 C)  99 F (37.2 C) (!) 97.3 F (36.3 C)  TempSrc: Oral  Oral Oral  SpO2: 98%  100% 100%  Weight:   53.5 kg (117 lb 14.4 oz)   Height:        Intake/Output Summary (Last 24 hours) at 02/16/2018 1616 Last data filed at 02/16/2018 1300 Gross per 24 hour  Intake 1802 ml  Output 600 ml  Net 1202 ml   Filed Weights   02/12/18 0226 02/15/18 0330 02/16/18 0541  Weight: 54.1 kg (119 lb 4.3 oz) 59.5 kg (131 lb 2.8 oz) 53.5 kg (117 lb 14.4 oz)    Exam:   General:  NAD  Cardiovascular: S1, S2 present  Respiratory: CTA  Abdomen: Soft, NT, ND, BS+  Musculoskeletal: Dry gangrenous R 3rd toe, no pedal edema b/l  Skin: Normal  Psychiatry: Normal mood   Data Reviewed: CBC: Recent Labs  Lab 02/11/18 2317 02/11/18 2331 02/14/18 0633 02/15/18 0309 02/16/18 0617  WBC 7.6  --  7.3 8.5 9.3  NEUTROABS 4.0  --   --  6.5 7.0  HGB 10.1* 12.6* 8.7* 7.8* 7.0*  HCT 31.3* 37.0* 28.1* 25.5* 23.1*  MCV 75.6*  --  76.4* 76.3* 77.0*  PLT 305  --  282 265 283   Basic Metabolic Panel: Recent Labs  Lab 02/11/18 2318 02/11/18 2331 02/14/18 0633 02/15/18 0309 02/16/18 0617  NA 134* 135 134* 133* 137  K 4.3 4.3 3.7 3.8 3.3*  CL 98* 97* 101 103 105  CO2 22  --  21* 21* 21*  GLUCOSE 319* 316* 149* 149* 87  BUN 21* 23* 14 13 18   CREATININE 1.44* 1.30* 1.25* 1.15 1.19  CALCIUM 9.7  --  9.3 8.6* 8.5*   GFR: Estimated Creatinine Clearance: 51.2 mL/min (by C-G formula based on SCr of 1.19 mg/dL). Liver Function Tests: Recent Labs  Lab 02/11/18 2318  AST 21  ALT 17  ALKPHOS 66  BILITOT 0.6  PROT 8.9*  ALBUMIN 4.3   No results for input(s): LIPASE, AMYLASE in the last 168 hours. No results for input(s): AMMONIA in the last 168 hours. Coagulation Profile: Recent Labs  Lab 02/14/18 0633  INR 1.22   Cardiac Enzymes: No results for  input(s): CKTOTAL, CKMB, CKMBINDEX, TROPONINI in the last 168 hours. BNP (last 3 results) No results for input(s): PROBNP in the last 8760 hours. HbA1C: No results for input(s): HGBA1C in the last 72 hours. CBG: Recent Labs  Lab 02/15/18 1603 02/15/18 2100 02/16/18 0803 02/16/18 1151 02/16/18 1608  GLUCAP 141* 109* 79 111* 132*   Lipid Profile: Recent Labs    02/16/18 0617  CHOL 91  HDL 34*  LDLCALC 37  TRIG 99  CHOLHDL 2.7   Thyroid Function Tests: No results for input(s): TSH, T4TOTAL, FREET4, T3FREE, THYROIDAB in the last 72 hours. Anemia Panel: Recent Labs    02/15/18 0309  FERRITIN 549*  TIBC 200*  IRON 64   Urine analysis: No results found for: COLORURINE, APPEARANCEUR, LABSPEC, PHURINE, GLUCOSEU, HGBUR, BILIRUBINUR, KETONESUR, PROTEINUR, UROBILINOGEN, NITRITE, LEUKOCYTESUR Sepsis Labs: @LABRCNTIP (procalcitonin:4,lacticidven:4)  ) Recent Results (from the past 240 hour(s))  Blood Cultures x 2 sites     Status: None (Preliminary result)   Collection Time: 02/12/18  2:39 AM  Result Value Ref Range Status   Specimen Description   Final    BLOOD LEFT ARM Performed at [facility], [address], [place], [place] [address]    Special Requests   Final    IN PEDIATRIC BOTTLE Blood Culture adequate volume Performed at [facility], [address] [place], [place] [address]    Culture   Final    NO GROWTH 4 DAYS Performed at [facility], [address], [place], [place] [address]    Report Status PENDING  Incomplete  Blood Cultures x 2 sites     Status: None (Preliminary result)   Collection Time: 02/12/18  2:39 AM  Result Value Ref Range Status   Specimen Description   Final    BLOOD RIGHT ANTECUBITAL Performed at [facility], [address], [place], [place] [address]    Special Requests   Final    BOTTLES DRAWN AEROBIC AND ANAEROBIC Blood Culture adequate volume Performed at [facility], [address], [place], [place] [address]    Culture   Final    NO GROWTH 4 DAYS Performed at [facility], [address], [place], [place] [address]    Report Status PENDING  Incomplete  MRSA PCR Screening     Status: None   Collection Time: 02/13/18  7:48 PM  Result Value Ref Range Status   MRSA by PCR NEGATIVE NEGATIVE Final    Comment:        The GeneXpert MRSA Assay (FDA approved for NASAL specimens only), is one component of a comprehensive MRSA colonization surveillance program. It is not intended to diagnose MRSA infection nor to guide  or monitor treatment for MRSA infections. Performed at [facility], [address] [place], [place] [address]       Studies: No results found.  Scheduled Meds: . amLODipine  5 mg Oral Daily  . aspirin EC  325 mg Oral Daily  . atorvastatin  20 mg Oral q1800  . clopidogrel  75 mg Oral Q breakfast  . enoxaparin (LOVENOX) injection  40 mg Subcutaneous Q24H  . folic acid  1 mg Oral Daily  . gabapentin  600 mg Oral BID  . insulin aspart  0-9 Units Subcutaneous TID WC  . insulin glargine  10 Units Subcutaneous QHS  . levETIRAcetam  500 mg Oral BID  . nutrition supplement (JUVEN)  1 packet Oral BID BM  . sodium chloride flush  3 mL Intravenous Q12H    Continuous Infusions: . sodium chloride    . sodium chloride    . cefTRIAXone (ROCEPHIN)  IV Stopped (02/16/18 0610)  . metronidazole Stopped (02/16/18 0955)     LOS: 4 days     Briant CedarNkeiruka J Ezenduka, MD [facility]   If 7PM-7AM, please contact night-coverage [email or web address] Password [email or web address] 02/16/2018, 4:16 PM

## 2018-02-16 NOTE — Progress Notes (Signed)
Patient ID: Luke Manning, male   DOB: 07/27/1961, 57 y.o.   MRN: [id number] Plan toe amputation right toe Monday afternoon. Discussed with patient and he agrees.  Questions were answered he requests we proceed.

## 2018-02-17 ENCOUNTER — Encounter (HOSPITAL_COMMUNITY)
Admission: EM | Disposition: A | Discharge status: Home / Self Care | Payer: Self-pay | Source: Home / Self Care | Attending: Internal Medicine

## 2018-02-17 ENCOUNTER — Inpatient Hospital Stay (HOSPITAL_COMMUNITY): Payer: PPO | Admitting: Certified Registered"

## 2018-02-17 ENCOUNTER — Encounter (HOSPITAL_COMMUNITY): Payer: Self-pay | Admitting: Vascular Surgery

## 2018-02-17 DIAGNOSIS — E1152 Type 2 diabetes mellitus with diabetic peripheral angiopathy with gangrene: Secondary | ICD-10-CM

## 2018-02-17 HISTORY — PX: AMPUTATION: SHX166

## 2018-02-17 LAB — BASIC METABOLIC PANEL
Anion gap: 9 (ref 5–15)
BUN: 12 mg/dL (ref 6–20)
CHLORIDE: 104 mmol/L (ref 101–111)
CO2: 23 mmol/L (ref 22–32)
CREATININE: 1.17 mg/dL (ref 0.61–1.24)
Calcium: 8.5 mg/dL — ABNORMAL LOW (ref 8.9–10.3)
GFR calc Af Amer: >60 mL/min (ref >60)
GFR calc non Af Amer: >60 mL/min (ref >60)
Glucose, Bld: 84 mg/dL (ref 65–99)
POTASSIUM: 3.1 mmol/L — AB (ref 3.5–5.1)
Sodium: 136 mmol/L (ref 135–145)

## 2018-02-17 LAB — GLUCOSE, CAPILLARY
GLUCOSE-CAPILLARY: 86 mg/dL (ref 65–99)
GLUCOSE-CAPILLARY: 105 mg/dL — AB (ref 65–99)
Glucose-Capillary: 78 mg/dL (ref 65–99)
Glucose-Capillary: 102 mg/dL — ABNORMAL HIGH (ref 65–99)
Glucose-Capillary: 119 mg/dL — ABNORMAL HIGH (ref 65–99)

## 2018-02-17 LAB — CULTURE, BLOOD (ROUTINE X 2)
CULTURE: NO GROWTH
Culture: NO GROWTH
SPECIAL REQUESTS: ADEQUATE
Special Requests: ADEQUATE

## 2018-02-17 LAB — CBC WITH DIFFERENTIAL/PLATELET
Basophils Absolute: 0 10*3/uL (ref 0.0–0.1)
Basophils Relative: 0 %
Eosinophils Absolute: 0.4 10*3/uL (ref 0.0–0.7)
Eosinophils Relative: 4 %
HEMATOCRIT: 27.1 % — AB (ref 39.0–52.0)
Hemoglobin: 8.6 g/dL — ABNORMAL LOW (ref 13.0–17.0)
LYMPHS ABS: 1.7 10*3/uL (ref 0.7–4.0)
LYMPHS PCT: 16 %
MCH: 25.1 pg — AB (ref 26.0–34.0)
MCHC: 31.7 g/dL (ref 30.0–36.0)
MCV: 79.2 fL (ref 78.0–100.0)
MONOS PCT: 9 %
Monocytes Absolute: 0.9 10*3/uL (ref 0.1–1.0)
NEUTROS ABS: 7.3 10*3/uL (ref 1.7–7.7)
NEUTROS PCT: 71 %
Platelets: 269 10*3/uL (ref 150–400)
RBC: 3.42 MIL/uL — ABNORMAL LOW (ref 4.22–5.81)
RDW: 13.8 % (ref 11.5–15.5)
WBC: 10.3 10*3/uL (ref 4.0–10.5)

## 2018-02-17 LAB — POCT ACTIVATED CLOTTING TIME: ACTIVATED CLOTTING TIME: 186 s

## 2018-02-17 SURGERY — AMPUTATION DIGIT
Anesthesia: General | Site: Toe | Laterality: Right

## 2018-02-17 MED ORDER — CEFAZOLIN SODIUM-DEXTROSE 1-4 GM/50ML-% IV SOLN
1.0000 g | Freq: Three times a day (TID) | INTRAVENOUS | Status: DC
  Administered 2018-02-17 – 2018-02-19 (×5): 1 g via INTRAVENOUS
  Filled 2018-02-17 (×6): qty 50

## 2018-02-17 MED ORDER — KCL IN DEXTROSE-NACL 40-5-0.45 MEQ/L-%-% IV SOLN
INTRAVENOUS | Status: AC
  Administered 2018-02-17: 08:00:00 via INTRAVENOUS
  Filled 2018-02-17: qty 1000

## 2018-02-17 MED ORDER — FENTANYL CITRATE (PF) 100 MCG/2ML IJ SOLN
INTRAMUSCULAR | Status: DC | PRN
  Administered 2018-02-17 (×2): 50 ug via INTRAVENOUS

## 2018-02-17 MED ORDER — PROMETHAZINE HCL 25 MG/ML IJ SOLN: 6.2500 mg | INTRAMUSCULAR | Status: DC | PRN

## 2018-02-17 MED ORDER — METOCLOPRAMIDE HCL 5 MG PO TABS: 5.0000 mg | ORAL_TABLET | Freq: Three times a day (TID) | ORAL | Status: DC | PRN

## 2018-02-17 MED ORDER — OXYCODONE HCL 5 MG/5ML PO SOLN: 5.0000 mg | Freq: Once | ORAL | Status: DC | PRN

## 2018-02-17 MED ORDER — 0.9 % SODIUM CHLORIDE (POUR BTL) OPTIME
TOPICAL | Status: DC | PRN
  Administered 2018-02-17: 1000 mL

## 2018-02-17 MED ORDER — ONDANSETRON HCL 4 MG/2ML IJ SOLN
INTRAMUSCULAR | Status: AC
  Filled 2018-02-17: qty 2

## 2018-02-17 MED ORDER — OXYCODONE HCL 5 MG PO TABS: 5.0000 mg | ORAL_TABLET | Freq: Once | ORAL | Status: DC | PRN

## 2018-02-17 MED ORDER — FENTANYL CITRATE (PF) 100 MCG/2ML IJ SOLN: 25.0000 ug | INTRAMUSCULAR | Status: DC | PRN

## 2018-02-17 MED ORDER — FENTANYL CITRATE (PF) 250 MCG/5ML IJ SOLN
INTRAMUSCULAR | Status: AC
  Filled 2018-02-17: qty 5

## 2018-02-17 MED ORDER — MIDAZOLAM HCL 2 MG/2ML IJ SOLN
INTRAMUSCULAR | Status: AC
  Filled 2018-02-17: qty 2

## 2018-02-17 MED ORDER — METOCLOPRAMIDE HCL 5 MG/ML IJ SOLN: 5.0000 mg | Freq: Three times a day (TID) | INTRAMUSCULAR | Status: DC | PRN

## 2018-02-17 MED ORDER — HYDROMORPHONE HCL 1 MG/ML IJ SOLN
0.5000 mg | INTRAMUSCULAR | Status: DC | PRN
  Administered 2018-02-17 – 2018-02-19 (×6): 0.5 mg via INTRAVENOUS
  Filled 2018-02-17 (×7): qty 0.5

## 2018-02-17 MED ORDER — PROPOFOL 10 MG/ML IV BOLUS
INTRAVENOUS | Status: DC | PRN
  Administered 2018-02-17: 120 mg via INTRAVENOUS

## 2018-02-17 MED ORDER — LIDOCAINE 2% (20 MG/ML) 5 ML SYRINGE
INTRAMUSCULAR | Status: DC | PRN
  Administered 2018-02-17: 60 mg via INTRAVENOUS

## 2018-02-17 MED ORDER — PROPOFOL 10 MG/ML IV BOLUS
INTRAVENOUS | Status: AC
  Filled 2018-02-17: qty 20

## 2018-02-17 MED ORDER — LACTATED RINGERS IV SOLN
INTRAVENOUS | Status: DC
  Administered 2018-02-17: 14:00:00 via INTRAVENOUS

## 2018-02-17 MED FILL — Lidocaine HCl Local Inj 1%: INTRAMUSCULAR | Qty: 20 | Status: AC

## 2018-02-17 SURGICAL SUPPLY — 50 items
BANDAGE ACE 4X5 VEL STRL LF (GAUZE/BANDAGES/DRESSINGS) ×3 IMPLANT
BENZOIN TINCTURE PRP APPL 2/3 (GAUZE/BANDAGES/DRESSINGS) ×3 IMPLANT
BLADE CLIPPER SURG (BLADE) IMPLANT
BNDG COHESIVE 1X5 TAN STRL LF (GAUZE/BANDAGES/DRESSINGS) IMPLANT
BNDG CONFORM 2 STRL LF (GAUZE/BANDAGES/DRESSINGS) IMPLANT
BNDG CONFORM 3 STRL LF (GAUZE/BANDAGES/DRESSINGS) IMPLANT
BNDG ELASTIC 2X5.8 VLCR STR LF (GAUZE/BANDAGES/DRESSINGS) IMPLANT
CLOSURE WOUND 1/2 X4 (GAUZE/BANDAGES/DRESSINGS) ×1
CORDS BIPOLAR (ELECTRODE) ×3 IMPLANT
COVER SURGICAL LIGHT HANDLE (MISCELLANEOUS) ×3 IMPLANT
CUFF TOURNIQUET SINGLE 18IN (TOURNIQUET CUFF) IMPLANT
CUFF TOURNIQUET SINGLE 24IN (TOURNIQUET CUFF) IMPLANT
DRSG EMULSION OIL 3X3 NADH (GAUZE/BANDAGES/DRESSINGS) IMPLANT
DURAPREP 26ML APPLICATOR (WOUND CARE) ×3 IMPLANT
ELECT REM PT RETURN 9FT ADLT (ELECTROSURGICAL)
ELECTRODE REM PT RTRN 9FT ADLT (ELECTROSURGICAL) IMPLANT
GAUZE SPONGE 2X2 8PLY STRL LF (GAUZE/BANDAGES/DRESSINGS) IMPLANT
GAUZE SPONGE 4X4 12PLY STRL (GAUZE/BANDAGES/DRESSINGS) ×3 IMPLANT
GAUZE XEROFORM 1X8 LF (GAUZE/BANDAGES/DRESSINGS) ×3 IMPLANT
GLOVE BIOGEL PI IND STRL 8 (GLOVE) ×1 IMPLANT
GLOVE BIOGEL PI INDICATOR 8 (GLOVE) ×2
GLOVE ORTHO TXT STRL SZ7.5 (GLOVE) ×3 IMPLANT
GOWN STRL REUS W/ TWL LRG LVL3 (GOWN DISPOSABLE) ×2 IMPLANT
GOWN STRL REUS W/TWL 2XL LVL3 (GOWN DISPOSABLE) IMPLANT
GOWN STRL REUS W/TWL LRG LVL3 (GOWN DISPOSABLE) ×4
KIT BASIN OR (CUSTOM PROCEDURE TRAY) ×3 IMPLANT
KIT ROOM TURNOVER OR (KITS) ×3 IMPLANT
MANIFOLD NEPTUNE II (INSTRUMENTS) ×3 IMPLANT
NS IRRIG 1000ML POUR BTL (IV SOLUTION) ×3 IMPLANT
PACK ORTHO EXTREMITY (CUSTOM PROCEDURE TRAY) ×3 IMPLANT
PAD ARMBOARD 7.5X6 YLW CONV (MISCELLANEOUS) ×6 IMPLANT
SPECIMEN JAR SMALL (MISCELLANEOUS) ×3 IMPLANT
SPONGE GAUZE 2X2 STER 10/PKG (GAUZE/BANDAGES/DRESSINGS)
STRIP CLOSURE SKIN 1/2X4 (GAUZE/BANDAGES/DRESSINGS) ×2 IMPLANT
SUCTION FRAZIER HANDLE 10FR (MISCELLANEOUS)
SUCTION TUBE FRAZIER 10FR DISP (MISCELLANEOUS) IMPLANT
SUT ETHIBOND 4 0 TF (SUTURE) IMPLANT
SUT ETHIBOND 5 0 P 3 (SUTURE)
SUT ETHILON 4 0 P 3 18 (SUTURE) IMPLANT
SUT ETHILON 5 0 P 3 18 (SUTURE) ×2
SUT MERSILENE 6 0 S14 DA (SUTURE) IMPLANT
SUT NYLON ETHILON 5-0 P-3 1X18 (SUTURE) ×1 IMPLANT
SUT POLY ETHIBOND 5-0 P-3 1X18 (SUTURE) IMPLANT
SUT PROLENE 4 0 P 3 18 (SUTURE) IMPLANT
SUT SILK 4 0 PS 2 (SUTURE) IMPLANT
TOWEL OR 17X24 6PK STRL BLUE (TOWEL DISPOSABLE) ×3 IMPLANT
TOWEL OR 17X26 10 PK STRL BLUE (TOWEL DISPOSABLE) ×3 IMPLANT
TUBE CONNECTING 12'X1/4 (SUCTIONS)
TUBE CONNECTING 12X1/4 (SUCTIONS) IMPLANT
WATER STERILE IRR 1000ML POUR (IV SOLUTION) ×3 IMPLANT

## 2018-02-17 NOTE — Progress Notes (Signed)
PROGRESS NOTE  Luke Manning ZOX:[id number] DOB: 10/12/1961 DOA: 02/11/2018 PCP: Jackie Plum, MD  HPI/Recap of past 69 hours: 57 year old African-American male with past medical history of diabetes mellitus type 2 on oral medications presented with complains of pain in the right third toe, ongoing for the past 1 month. Pt went to see his podiatrist who recommended that he come into the hospital for further evaluation. In the ED, toe noted to be gangrenous. Pt admitted for further management.  Today, pt c/o sore-throat. Denies chest pain, SOB, abdominal pain, fever/chills, or any signs of bleeding. Pt had toe amputation today.  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 Afebrile, no leukocytosis Foot xray, no concern for osteomyelitis BC X 2 NGTD Continue ceftriaxone and metronidazole  PVD ABI abnormal, R ABI 0.46, vascular surgery on board Had angiogram with successful balloon angioplasty to the R superficial femoral artery on 02/14/18 Start ASA + Plavix + statin  Diabetes mellitus type 2 Uncontrolled, A1c 11.4 SSI, lantus 10U @ bedtime Will discharge pt on insulin and plan to d/c metformin Hypoglycemic protocol  Microcytic anemia Acute drop in hemoglobin, no signs of bleeding Iron panel: Fe 64, TIBC 200, sats 32, ferritin 549, AOCD FOBT pending, d/c lovenox for now Transfused 1U of PRBC on 02/16/18 Daily CBC  Essential hypertension Stable Continue amlodipine  History of seizure disorder Continue Keppra   Code Status: Full  Family Communication: None at bedside  Disposition Plan: TBD    Consultants:  Vascular  Orthopedics  Procedures:  None  Antimicrobials:  IV ceftriaxone  IV metronidazole  DVT prophylaxis:  Lovenox held,  SCD   Objective: Vitals:   02/17/18 1436 02/17/18 1450 02/17/18 1505 02/17/18 1519  BP:  137/82 123/90 133/88  Pulse:  88 86 83  Resp:  18 16 15   Temp: (!) 97.2 F (36.2 C)   98 F (36.7 C)  TempSrc:    Oral  SpO2:  100% 100% 99%  Weight:      Height:        Intake/Output Summary (Last 24 hours) at 02/17/2018 1650 Last data filed at 02/17/2018 1443 Gross per 24 hour  Intake 1368.67 ml  Output 725 ml  Net 643.67 ml   Filed Weights   02/16/18 0541 02/17/18 0545 02/17/18 1320  Weight: 53.5 kg (117 lb 14.4 oz) 51 kg (112 lb 8 oz) 51 kg (112 lb 8 oz)    Exam:   General:  NAD  Cardiovascular: S1, S2 present  Respiratory: CTA  Abdomen: Soft, NT, ND, BS+  Musculoskeletal: No pedal edema b/l  Skin: Normal  Psychiatry: Normal mood   Data Reviewed: CBC: Recent Labs  Lab 02/11/18 2317 02/11/18 2331 02/14/18 8119 02/15/18 0309 02/16/18 0617 02/17/18 0404  WBC 7.6  --  7.3 8.5 9.3 10.3  NEUTROABS 4.0  --   --  6.5 7.0 7.3  HGB 10.1* 12.6* 8.7* 7.8* 7.0* 8.6*  HCT 31.3* 37.0* 28.1* 25.5* 23.1* 27.1*  MCV 75.6*  --  76.4* 76.3* 77.0* 79.2  PLT 305  --  282 265 283 269   Basic Metabolic Panel: Recent Labs  Lab 02/11/18 2318 02/11/18 2331 02/14/18 0633 02/15/18 0309 02/16/18 0617 02/17/18 0404  NA 134* 135 134* 133* 137 136  K 4.3 4.3 3.7 3.8 3.3* 3.1*  CL 98* 97* 101 103 105 104  CO2 22  --  21* 21* 21* 23  GLUCOSE 319* 316* 149* 149* 87 84  BUN 21* 23* 14 13 18 12   CREATININE 1.44* 1.30* 1.25* 1.15 1.19 1.17  CALCIUM 9.7  --  9.3 8.6* 8.5* 8.5*   GFR: Estimated Creatinine Clearance: 49.6 mL/min (by C-G formula based on SCr of 1.17 mg/dL). Liver Function Tests: Recent Labs  Lab 02/11/18 2318  AST 21  ALT 17  ALKPHOS 66  BILITOT 0.6  PROT 8.9*  ALBUMIN 4.3   No results for input(s): LIPASE, AMYLASE in the last 168 hours. No results for input(s): AMMONIA in the last 168 hours. Coagulation Profile: Recent Labs  Lab 02/14/18 0633  INR  1.22   Cardiac Enzymes: No results for input(s): CKTOTAL, CKMB, CKMBINDEX, TROPONINI in the last 168 hours. BNP (last 3 results) No results for input(s): PROBNP in the last 8760 hours. HbA1C: No results for input(s): HGBA1C in the last 72 hours. CBG: Recent Labs  Lab 02/16/18 2125 02/17/18 0737 02/17/18 1119 02/17/18 1319 02/17/18 1447  GLUCAP 81 78 102* 119* 86   Lipid Profile: Recent Labs    02/16/18 0617  CHOL 91  HDL 34*  LDLCALC 37  TRIG 99  CHOLHDL 2.7   Thyroid Function Tests: No results for input(s): TSH, T4TOTAL, FREET4, T3FREE, THYROIDAB in the last 72 hours. Anemia Panel: Recent Labs    02/15/18 0309  FERRITIN 549*  TIBC 200*  IRON 64   Urine analysis: No results found for: COLORURINE, APPEARANCEUR, LABSPEC, PHURINE, GLUCOSEU, HGBUR, BILIRUBINUR, KETONESUR, PROTEINUR, UROBILINOGEN, NITRITE, LEUKOCYTESUR Sepsis Labs: @LABRCNTIP (procalcitonin:4,lacticidven:4)  ) Recent Results (from the past 240 hour(s))  Blood Cultures x 2 sites     Status: None   Collection Time: 02/12/18  2:39 AM  Result Value Ref Range Status   Specimen Description   Final    BLOOD LEFT ARM Performed at [facility], [address], [place], [place] [address]    Special Requests   Final    IN PEDIATRIC BOTTLE Blood Culture adequate volume Performed at [facility], [address], [place], [place] [address]    Culture   Final    NO GROWTH 5 DAYS Performed at [facility], [address], [place], [place] [address]    Report Status 02/17/2018 FINAL  Final  Blood Cultures x 2 sites     Status: None   Collection Time: 02/12/18  2:39 AM  Result Value Ref Range Status   Specimen Description   Final    BLOOD RIGHT ANTECUBITAL Performed at [facility] [address], [place], [place] [address]    Special Requests   Final    BOTTLES DRAWN AEROBIC AND ANAEROBIC Blood Culture adequate volume Performed at [facility], [address] [place], [place] [address]    Culture   Final    NO GROWTH 5 DAYS Performed at [facility], [address], [place], [place] [address]    Report Status 02/17/2018 FINAL  Final  MRSA PCR Screening     Status: None   Collection Time: 02/13/18  7:48 PM  Result Value Ref Range Status   MRSA by PCR NEGATIVE NEGATIVE Final    Comment:        The GeneXpert MRSA Assay (FDA approved for NASAL specimens only), is one component of a comprehensive MRSA colonization surveillance program. It is not intended to diagnose MRSA  infection nor to guide or monitor treatment for MRSA infections. Performed at [facility], [address] [place], [place] [address]       Studies: No results found.  Scheduled Meds: . amLODipine  5 mg Oral Daily  . aspirin EC  325 mg Oral Daily  . atorvastatin  20 mg Oral q1800  . clopidogrel  75 mg Oral Q breakfast  . folic acid  1 mg Oral Daily  . gabapentin  600 mg Oral BID  . insulin aspart  0-9 Units Subcutaneous TID WC  . insulin glargine  10 Units Subcutaneous QHS  . levETIRAcetam  500 mg Oral BID  . nutrition supplement (JUVEN)  1 packet Oral BID BM  . sodium chloride flush  3 mL Intravenous Q12H    Continuous Infusions: . sodium chloride    .  ceFAZolin (ANCEF) IV    . dextrose 5 % and 0.45 % NaCl with KCl 40 mEq/L 100 mL/hr at 02/17/18 0828  . lactated ringers    . metronidazole Stopped (02/17/18 0931)     LOS: 5 days     Briant CedarNkeiruka J Ezenduka, MD [facility]   If 7PM-7AM, please contact night-coverage [email or web address] Password [email or web address] 02/17/2018, 4:50 PM

## 2018-02-17 NOTE — Op Note (Signed)
Preop diagnosis: Gangrene right third toe  Postop diagnosis: Same  Procedure: Right third toe amputation through metatarsal phalangeal joint.  Anesthesia: LMA  Tourniquet: None  Procedure after induction of anesthesia with LMA tube placement 1015 drape was applied at the knee prepping with DuraPrep there is was isolated with extremity sheets drapes and stockinette timeout procedure completed.  Patient been on double antibiotics for multiple days.  Sterile skin marker was used for racquet handle incision with anterior posterior flap.  Dorsally the skin was split in the midline cutting down directly onto bone dividing the extensor tendon and cutting through the capsule at the metatarsal phalangeal joint.  Toe was amputated.  There was good skin bleeding and cautery was used.  Irrigation with saline solution and closure with 2-0 nylon multiple interrupted and horizontal mattress sutures.  Xeroform folded 4 x 4 soft dressing and Coban was applied.

## 2018-02-17 NOTE — Anesthesia Procedure Notes (Signed)
Procedure Name: LMA Insertion Date/Time: 02/17/2018 2:14 PM Performed by: Marny Lowensteinapozzi, Chenita Ruda W, CRNA Pre-anesthesia Checklist: Patient identified, Emergency Drugs available, Suction available, Patient being monitored and Timeout performed Patient Re-evaluated:Patient Re-evaluated prior to induction Oxygen Delivery Method: Circle system utilized Preoxygenation: Pre-oxygenation with 100% oxygen Induction Type: IV induction Ventilation: Mask ventilation without difficulty LMA: LMA inserted LMA Size: 3.0 Number of attempts: 1 Placement Confirmation: positive ETCO2,  CO2 detector and breath sounds checked- equal and bilateral Tube secured with: Tape Dental Injury: Teeth and Oropharynx as per pre-operative assessment

## 2018-02-17 NOTE — Consult Note (Signed)
[facility] CM Primary Care Navigator  02/17/2018  Dayven Linsley 1961/11/30 [id number]   Met withpatient at the bedside and spoke with wife Tye [place]) on the phone to identify possibledischarge needs.  Patient reports having"pain to right 3rd toe with gangrene" that had led to this admission/ surgery (status post right 3rd toe amputation).  Patient endorses Dr.George Osei-Bonsu with [facility] as the primary care provider.   [facility] [address] [facility] [address] obtain medications without difficulty. Wife mentioned that patient will apply for Medicaid as well.  Patient verbalized thatwife manages his medications at [facility] use of "pill box"system filled every week.  Patient's wife verbalized that she providestransportation to his doctors' appointments.  Wife is the primary caregiver at home as stated.  Discharge plan remains to be determinedpending therapy evaluation and recommendation per RN report.   Patient and wife voiced understanding to call primary care provider's office when he returns back home for a post discharge follow-up appointment within 1-2 weeks or sooner if needs arise.Patient letter (with PCP's contact number) wasprovided as theirreminder.  Explained to patient and wife regarding[facility] CM services available for healthmanagementand resources at home but discharge disposition is still pending at this point. Wife expressed interest with services and had expressed understanding to discuss with primary care provider on the next visit (particularly on further DM management with A1C of 11.4) and requestreferral to [facility] care managementfrom primary care provider ifdeemed necessary andappropriatefor services in the near future.  Noted that inpatient DM coordinator had been seeing patient on this admission.  [facility] care management information provided for any future  needs that may arise.   For additional questions please contact:  Edwena Felty A. Shaquill Iseman, BSN, RN-BC [facility] PRIMARY CARE Navigator Cell: [phone]

## 2018-02-17 NOTE — Transfer of Care (Signed)
Immediate Anesthesia Transfer of Care Note  Patient: Luke Manning  Procedure(s) Performed: AMPUTATION, RIGHT 3RD TOE (Right Toe)  Patient Location: PACU  Anesthesia Type:General  Level of Consciousness: drowsy  Airway & Oxygen Therapy: Patient Spontanous Breathing and Patient connected to face mask oxygen  Post-op Assessment: Report given to RN and Post -op Vital signs reviewed and stable  Post vital signs: Reviewed and stable  Last Vitals:  Vitals:   02/17/18 0740 02/17/18 1436  BP: 124/60   Pulse: 90   Resp:    Temp: 36.8 C (!) (P) 36.2 C  SpO2: 100%     Last Pain:  Vitals:   02/17/18 1436  TempSrc:   PainSc: (P) Asleep      Patients Stated Pain Goal: 0 (02/17/18 0950)  Complications: No apparent anesthesia complications

## 2018-02-17 NOTE — Progress Notes (Signed)
Orthopedic Tech Progress Note Patient Details:  Luke Manning 07/21/1961 [id number]  Ortho Devices Type of Ortho Device: Darco shoe Ortho Device/Splint Location: RLE Ortho Device/Splint Interventions: Casandra DoffingOrdered       [name] 02/17/2018, 5:22 PM

## 2018-02-17 NOTE — Anesthesia Preprocedure Evaluation (Signed)
Anesthesia Evaluation  Patient identified by MRN, date of birth, ID band Patient awake    Reviewed: Allergy & Precautions, NPO status , Patient's Chart, lab work & pertinent test results  Airway Mallampati: II  TM Distance: >3 FB Neck ROM: Full    Dental  (+) Dental Advisory Given   Pulmonary neg pulmonary ROS,    Pulmonary exam normal breath sounds clear to auscultation       Cardiovascular hypertension, + Past MI  Normal cardiovascular exam Rhythm:Regular Rate:Normal     Neuro/Psych Seizures -,  negative psych ROS   GI/Hepatic negative GI ROS, Neg liver ROS,   Endo/Other  diabetes, Type 2, Oral Hypoglycemic Agents  Renal/GU negative Renal ROS  negative genitourinary   Musculoskeletal negative musculoskeletal ROS (+)   Abdominal   Peds  Hematology  (+) anemia ,   Anesthesia Other Findings Hypokalemia  Reproductive/Obstetrics                             Anesthesia Physical Anesthesia Plan  ASA: III  Anesthesia Plan: General   Post-op Pain Management:    Induction: Intravenous  PONV Risk Score and Plan: 2 and Treatment may vary due to age or medical condition, Ondansetron, Midazolam and Dexamethasone  Airway Management Planned: LMA  Additional Equipment: None  Intra-op Plan:   Post-operative Plan: Extubation in OR  Informed Consent: I have reviewed the patients History and Physical, chart, labs and discussed the procedure including the risks, benefits and alternatives for the proposed anesthesia with the patient or authorized representative who has indicated his/her understanding and acceptance.   Dental advisory given  Plan Discussed with: CRNA  Anesthesia Plan Comments:         Anesthesia Quick Evaluation

## 2018-02-17 NOTE — Interval H&P Note (Signed)
History and Physical Interval Note:  02/17/2018 11:30 AM  Luke HickmanAlvin Mees  has presented today for surgery, with the diagnosis of RIGHT 3RD TOE GANGRENE  The various methods of treatment have been discussed with the patient and family. After consideration of risks, benefits and other options for treatment, the patient has consented to  Procedure(s): AMPUTATION, RIGHT 3RD TOE (Right) as a surgical intervention .  The patient's history has been reviewed, patient examined, no change in status, stable for surgery.  I have reviewed the patient's chart and labs.  Questions were answered to the patient's satisfaction.     Eldred MangesMark C Chrisopher Pustejovsky

## 2018-02-17 NOTE — Anesthesia Postprocedure Evaluation (Signed)
Anesthesia Post Note  Patient: Luke Manning  Procedure(s) Performed: AMPUTATION, RIGHT 3RD TOE (Right Toe)     Patient location during evaluation: PACU Anesthesia Type: General Level of consciousness: awake and alert Pain management: pain level controlled Vital Signs Assessment: post-procedure vital signs reviewed and stable Respiratory status: spontaneous breathing, nonlabored ventilation and respiratory function stable Cardiovascular status: blood pressure returned to baseline and stable Postop Assessment: no apparent nausea or vomiting Anesthetic complications: no    Last Vitals:  Vitals:   02/17/18 1450 02/17/18 1505  BP: 137/82 123/90  Pulse: 88 86  Resp: 18 16  Temp:    SpO2: 100% 100%    Last Pain:  Vitals:   02/17/18 1505  TempSrc:   PainSc: 0-No pain        RLE Motor Response: Responds to commands (02/17/18 1505) RLE Sensation: Full sensation (02/17/18 1505)      Beryle Lathehomas E Tysin Salada

## 2018-02-18 ENCOUNTER — Encounter (HOSPITAL_COMMUNITY): Payer: Self-pay | Admitting: Orthopaedic Surgery

## 2018-02-18 LAB — CBC WITH DIFFERENTIAL/PLATELET
BASOS PCT: 0 %
Basophils Absolute: 0 10*3/uL (ref 0.0–0.1)
EOS ABS: 0.3 10*3/uL (ref 0.0–0.7)
Eosinophils Relative: 3 %
HEMATOCRIT: 27.7 % — AB (ref 39.0–52.0)
Hemoglobin: 8.8 g/dL — ABNORMAL LOW (ref 13.0–17.0)
Lymphocytes Relative: 12 %
Lymphs Abs: 1.3 10*3/uL (ref 0.7–4.0)
MCH: 25.1 pg — ABNORMAL LOW (ref 26.0–34.0)
MCHC: 31.8 g/dL (ref 30.0–36.0)
MCV: 79.1 fL (ref 78.0–100.0)
MONO ABS: 1 10*3/uL (ref 0.1–1.0)
Monocytes Relative: 9 %
NEUTROS ABS: 7.9 10*3/uL — AB (ref 1.7–7.7)
Neutrophils Relative %: 76 %
Platelets: 308 10*3/uL (ref 150–400)
RBC: 3.5 MIL/uL — ABNORMAL LOW (ref 4.22–5.81)
RDW: 14.1 % (ref 11.5–15.5)
WBC: 10.5 10*3/uL (ref 4.0–10.5)

## 2018-02-18 LAB — GLUCOSE, CAPILLARY
GLUCOSE-CAPILLARY: 72 mg/dL (ref 65–99)
GLUCOSE-CAPILLARY: 91 mg/dL (ref 65–99)
GLUCOSE-CAPILLARY: 100 mg/dL — AB (ref 65–99)
Glucose-Capillary: 129 mg/dL — ABNORMAL HIGH (ref 65–99)
Glucose-Capillary: 141 mg/dL — ABNORMAL HIGH (ref 65–99)

## 2018-02-18 LAB — BASIC METABOLIC PANEL
ANION GAP: 9 (ref 5–15)
BUN: 11 mg/dL (ref 6–20)
CALCIUM: 8.5 mg/dL — AB (ref 8.9–10.3)
CO2: 22 mmol/L (ref 22–32)
CREATININE: 1.12 mg/dL (ref 0.61–1.24)
Chloride: 105 mmol/L (ref 101–111)
Glucose, Bld: 114 mg/dL — ABNORMAL HIGH (ref 65–99)
Potassium: 3.2 mmol/L — ABNORMAL LOW (ref 3.5–5.1)
SODIUM: 136 mmol/L (ref 135–145)

## 2018-02-18 MED ORDER — ENOXAPARIN SODIUM 40 MG/0.4ML ~~LOC~~ SOLN
40.0000 mg | SUBCUTANEOUS | Status: DC
  Administered 2018-02-18 – 2018-02-19 (×2): 40 mg via SUBCUTANEOUS
  Filled 2018-02-18 (×2): qty 0.4

## 2018-02-18 MED ORDER — DOCUSATE SODIUM 100 MG PO CAPS
100.0000 mg | ORAL_CAPSULE | Freq: Two times a day (BID) | ORAL | Status: DC
  Administered 2018-02-18 – 2018-02-19 (×3): 100 mg via ORAL
  Filled 2018-02-18 (×4): qty 1

## 2018-02-18 NOTE — Clinical Social Work Note (Signed)
Clinical Social Work Assessment  Patient Details  Name: Luke Manning MRN: [id number] Date of Birth: February 04, 1961  Date of referral:  02/18/18               Reason for consult:  Facility Placement                Permission sought to share information with:  Facility Sport and exercise psychologist, Family Supports Permission granted to share information::  Yes, Verbal Permission Granted  Name::     Luke Manning  Agency::  SNFs  Relationship::  spouse  Contact Information:  [phone]  Housing/Transportation Living arrangements for the past 2 months:  Single Family Home Source of Information:  Patient Patient Interpreter Needed:  None Criminal Activity/Legal Involvement Pertinent to Current Situation/Hospitalization:  No - Comment as needed Significant Relationships:  Spouse Lives with:  Spouse Do you feel safe going back to the place where you live?  Yes Need for family participation in patient care:  No (Coment)  Care giving concerns: Patient from home with spouse. PT recommending SNF.  Social Worker assessment / plan: CSW met with patient at bedside. Patient lethargic and ill appearing; patient reported he is feeling nauseous. Patient fully oriented. CSW discussed PT recommendation for SNF. Patient understanding of recommendation. Patient reported that PTA he was independent with mobility and ADLs. Patient open to SNF referrals, though per PT, may be able to progress to home health. CSW to send out referrals and follow for disposition planning. Patient gave permission to discuss with his wife; CSW left voicemail for Mrs. [place]urg.  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  Patient/Family's Understanding of and Emotional Response to Diagnosis, Current Treatment, and  Prognosis: Patient with understanding of his condition and open to SNF referrals.  Emotional Assessment Appearance:  Appears stated age Attitude/Demeanor/Rapport:  Engaged, Lethargic Affect (typically observed):  Calm Orientation:  Oriented to Self, Oriented to Place, Oriented to  Time, Oriented to Situation Alcohol / Substance use:  Not Applicable Psych involvement (Current and /or in the community):  No (Comment)  Discharge Needs  Concerns to be addressed:  Discharge Planning Concerns, Care Coordination Readmission within the last 30 days:  No Current discharge risk:  Physical Impairment Barriers to Discharge:  Continued Medical Work up   Estanislado Emms, LCSW 02/18/2018, 12:17 PM

## 2018-02-18 NOTE — Care Management Important Message (Signed)
Important Message  Patient Details  Name: Luke Manning MRN: [id number] Date of Birth: 02/26/1961   Medicare Important Message Given:  Yes    Mariaceleste Herrera 02/18/2018, 9:24 AM

## 2018-02-18 NOTE — Evaluation (Signed)
Occupational Therapy Evaluation Patient Details Name: Luke Manning MRN: [id number] DOB: 1961-11-30 Today's Date: 02/18/2018    History of Present Illness 56yo male referred to the ED by the foot center secondary to R third toe gangrene, cellulitis with severe pain. He received surgery for amputation of his third R toe on 02/17/18. PMH DM, hx MI    Clinical Impression   PTA, pt reports independence with cane for ADL and functional mobility. He currently is limited by significant R foot pain limiting his ability to participate in ADL at [facility]. Pt currently requires overall min assist for toilet transfers and LB ADL as a result. At current functional level, requires hands on 24 hour assistance for ADL and functional mobility and would benefit from short-term SNF level rehabilitation post-acute D/C. However, once pain under control, pt may be able to progress with acute therapies enough to D/C home safely with 24 hour assistance from wife and home health OT follow-up. OT will continue to follow while admitted.     Follow Up Recommendations  SNF;Supervision/Assistance - 24 hour(but may progress to be able to D/C home with [facility])    Equipment Recommendations  Other (comment)(TBD at next venue of care)    Recommendations for Other Services       Precautions / Restrictions Precautions Precautions: Fall Precaution Comments: needs Darco shoe  Restrictions Weight Bearing Restrictions: Yes RLE Weight Bearing: Weight bearing as tolerated Other Position/Activity Restrictions: in Darco shoe       Mobility Bed Mobility Overal bed mobility: Needs Assistance Bed Mobility: Supine to Sit     Supine to sit: Supervision     General bed mobility comments: Increased time and effort.   Transfers Overall transfer level: Needs assistance Equipment used: Rolling walker (2 wheeled) Transfers: Sit to/from Stand Sit to Stand: Min assist         General transfer comment: Min assist to stabilize and  power up.     Balance Overall balance assessment: Mild deficits observed, not formally tested                                         ADL either performed or assessed with clinical judgement   ADL Overall ADL's : Needs assistance/impaired Eating/Feeding: Set up;Sitting   Grooming: Supervision/safety;Sitting   Upper Body Bathing: Set up;Sitting   Lower Body Bathing: Minimal assistance;Sit to/from stand   Upper Body Dressing : Set up;Sitting   Lower Body Dressing: Minimal assistance;Sit to/from stand   Toilet Transfer: Minimal assistance;Ambulation;RW Toilet Transfer Details (indicate cue type and reason): only able to take a few steps this session Toileting- Clothing Manipulation and Hygiene: Minimal assistance;Sit to/from stand       Functional mobility during ADLs: Minimal assistance;Rolling walker General ADL Comments: Pt able to take a few steps today but limited due to lethargy and pain.      Vision Patient Visual Report: No change from baseline Vision Assessment?: No apparent visual deficits Additional Comments: Using vision functionally during assessment.      Perception     Praxis      Pertinent Vitals/Pain Pain Assessment: 0-10 Pain Score: 10-Worst pain ever Pain Location: R foot Pain Descriptors / Indicators: Aching;Sharp;Sore Pain Intervention(s): Limited activity within patient's tolerance;Monitored during session;Repositioned     Hand Dominance     Extremity/Trunk Assessment Upper Extremity Assessment Upper Extremity Assessment: Generalized weakness   Lower Extremity Assessment Lower Extremity  Assessment: Generalized weakness;RLE deficits/detail RLE Deficits / Details: Decreased strength and ROM as expected post-operatively.    Cervical / Trunk Assessment Cervical / Trunk Assessment: Normal   Communication Communication Communication: No difficulties   Cognition Arousal/Alertness: Lethargic Behavior During Therapy: WFL for  tasks assessed/performed Overall Cognitive Status: Within Functional Limits for tasks assessed                                 General Comments: Pt following commands well this session but lethargic.    General Comments       Exercises     Shoulder Instructions      Home Living Family/patient expects to be discharged to:: Private residence Living Arrangements: Spouse/significant other Available Help at Discharge: Family Type of Home: House Home Access: Stairs to enter Secretary/administrator of Steps: 4 Entrance Stairs-Rails: Can reach both Home Layout: One level     Bathroom Shower/Tub: Producer, television/film/video: Standard     Home Equipment: Cane - single point;Grab bars - tub/shower          Prior Functioning/Environment Level of Independence: Independent with assistive device(s)        Comments: uses cane        OT Problem List: Decreased strength;Decreased range of motion;Decreased activity tolerance;Impaired balance (sitting and/or standing);Decreased safety awareness;Decreased knowledge of use of DME or AE;Decreased knowledge of precautions;Pain      OT Treatment/Interventions: Self-care/ADL training;Therapeutic exercise;Energy conservation;DME and/or AE instruction;Therapeutic activities;Patient/family education;Balance training    OT Goals(Current goals can be found in the care plan section) Acute Rehab OT Goals Patient Stated Goal: to reduce pain  OT Goal Formulation: With patient Time For Goal Achievement: 03/04/18 Potential to Achieve Goals: Good ADL Goals Pt Will Perform Grooming: with modified independence;standing Pt Will Perform Lower Body Dressing: with modified independence;sit to/from stand Pt Will Transfer to Toilet: with modified independence;ambulating Pt Will Perform Toileting - Clothing Manipulation and hygiene: with modified independence;sit to/from stand Pt Will Perform Tub/Shower Transfer: with modified  independence;Shower transfer;ambulating;rolling walker(RW or cane)  OT Frequency: Min 2X/week   Barriers to D/C:            Co-evaluation              AM-PAC PT "6 Clicks" Daily Activity     Outcome Measure Help from another person eating meals?: A Little Help from another person taking care of personal grooming?: A Little Help from another person toileting, which includes using toliet, bedpan, or urinal?: A Little Help from another person bathing (including washing, rinsing, drying)?: A Little Help from another person to put on and taking off regular upper body clothing?: A Little Help from another person to put on and taking off regular lower body clothing?: A Little 6 Click Score: 18   End of Session Equipment Utilized During Treatment: Rolling walker(no Darco shoe in room) Nurse Communication: Mobility status  Activity Tolerance: Patient tolerated treatment well Patient left: in bed;with call bell/phone within reach  OT Visit Diagnosis: Other abnormalities of gait and mobility (R26.89);Pain Pain - Right/Left: Right Pain - part of body: Ankle and joints of foot                Time: 1610-9604 OT Time Calculation (min): 10 min Charges:  OT General Charges $OT Visit: 1 Visit OT Evaluation $OT Eval Moderate Complexity: 1 Mod G-Codes:     Luke Whitaker A Jaeshaun Riva, MS OTR/L  Pager:  [phone]   Yeray Tomas A Aiyannah Fayad 02/18/2018, 3:11 PM

## 2018-02-18 NOTE — Progress Notes (Signed)
PROGRESS NOTE  Luke Manning BJY:[id number] DOB: 06/12/1961 DOA: 02/11/2018 PCP: Jackie Plumsei-Bonsu, George, MD  HPI/Recap of past 6924 hours: 57 year old male with past medical history of diabetes mellitus type 2 on oral medications presented with complains of pain in the right third toe, ongoing for the past 1 month. Pt went to see his podiatrist who recommended that he come into the hospital for further evaluation. In the ED, toe noted to be gangrenous. Pt admitted for further management.  Today, pt reported post op pain. Denies chest pain, SOB, abdominal pain, fever/chills.  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 Spiked a temp of 100.8, likely post op, no leukocytosis Foot xray, no concern for osteomyelitis BC X 2 NGTD, if persistent fever, repeat BC +/- sepsis w/u Changed ceftriaxone to cefazolin post op, continue metronidazole Monitor closely PT/OT  PVD ABI abnormal, R ABI 0.46, vascular surgery on board Had angiogram with successful balloon angioplasty to the R superficial femoral artery on 02/14/18 Start ASA + Plavix + statin  Diabetes mellitus type 2 Uncontrolled, A1c 11.4 SSI, lantus 10U @ bedtime Will discharge pt on insulin and plan to d/c metformin Hypoglycemic protocol  Microcytic anemia Acute drop in hemoglobin, no signs of bleeding Iron panel: Fe 64, TIBC 200, sats 32, ferritin 549, AOCD FOBT pending Transfused 1U of PRBC on 02/16/18 Daily CBC  Essential hypertension Stable Continue amlodipine  History of seizure disorder Continue Keppra   Code Status: Full  Family Communication: None at bedside  Disposition Plan: PT rec SNF for now, but may change to HHPT as pt progresses    Consultants:  Vascular  Orthopedics  Procedures:  S/P  amputation 3rd toe R foot on 02/17/18  Antimicrobials:  IV ceftriaxone changed to cefazolin post op  IV metronidazole  DVT prophylaxis:  Lovenox   Objective: Vitals:   02/17/18 2001 02/18/18 0514 02/18/18 1103 02/18/18 1300  BP: (!) 144/79 137/78 140/67 136/71  Pulse: 97 100    Resp: 16 15    Temp: 98.6 F (37 C) (!) 100.8 F (38.2 C)  99.9 F (37.7 C)  TempSrc: Oral Oral  Oral  SpO2: 97% 99%    Weight:  55.9 kg (123 lb 3.8 oz)    Height:        Intake/Output Summary (Last 24 hours) at 02/18/2018 1533 Last data filed at 02/18/2018 1331 Gross per 24 hour  Intake 1298.33 ml  Output 400 ml  Net 898.33 ml   Filed Weights   02/17/18 0545 02/17/18 1320 02/18/18 0514  Weight: 51 kg (112 lb 8 oz) 51 kg (112 lb 8 oz) 55.9 kg (123 lb 3.8 oz)    Exam:   General:  NAD  Cardiovascular: S1, S2 present  Respiratory: CTA  Abdomen: Soft, NT, ND, BS+  Musculoskeletal:  R foot in ace wrap. No pedal edema b/l  Skin: Normal  Psychiatry: Normal mood   Data Reviewed: CBC: Recent Labs  Lab 02/11/18 2317  02/14/18 [id number] 02/15/18 0309 02/16/18 0617 02/17/18 0404 02/18/18 0330  WBC 7.6  --  7.3 8.5 9.3 10.3 10.5  NEUTROABS 4.0  --   --  6.5 7.0 7.3 7.9*  HGB 10.1*   < > 8.7* 7.8* 7.0* 8.6* 8.8*  HCT 31.3*   < > 28.1* 25.5* 23.1* 27.1* 27.7*  MCV 75.6*  --  76.4* 76.3* 77.0* 79.2 79.1  PLT 305  --  282 265 283 269 308   < > = values in this interval not displayed.   Basic Metabolic Panel: Recent Labs  Lab 02/14/18 0633 02/15/18 0309 02/16/18 0617 02/17/18 0404 02/18/18 0330  NA 134* 133* 137 136 136  K 3.7 3.8 3.3* 3.1* 3.2*  CL 101 103 105 104 105  CO2 21* 21* 21* 23 22  GLUCOSE 149* 149* 87 84 114*  BUN 14 13 18 12 11   CREATININE 1.25* 1.15 1.19 1.17 1.12  CALCIUM 9.3 8.6* 8.5* 8.5* 8.5*   GFR: Estimated Creatinine Clearance: 55.5 mL/min (by C-G formula based on SCr of 1.12 mg/dL). Liver Function Tests: Recent Labs  Lab 02/11/18 2318  AST 21  ALT  17  ALKPHOS 66  BILITOT 0.6  PROT 8.9*  ALBUMIN 4.3   No results for input(s): LIPASE, AMYLASE in the last 168 hours. No results for input(s): AMMONIA in the last 168 hours. Coagulation Profile: Recent Labs  Lab 02/14/18 0633  INR 1.22   Cardiac Enzymes: No results for input(s): CKTOTAL, CKMB, CKMBINDEX, TROPONINI in the last 168 hours. BNP (last 3 results) No results for input(s): PROBNP in the last 8760 hours. HbA1C: No results for input(s): HGBA1C in the last 72 hours. CBG: Recent Labs  Lab 02/17/18 1447 02/17/18 1637 02/17/18 2055 02/18/18 0803 02/18/18 1137  GLUCAP 86 72 105* 91 100*   Lipid Profile: Recent Labs    02/16/18 0617  CHOL 91  HDL 34*  LDLCALC 37  TRIG 99  CHOLHDL 2.7   Thyroid Function Tests: No results for input(s): TSH, T4TOTAL, FREET4, T3FREE, THYROIDAB in the last 72 hours. Anemia Panel: No results for input(s): VITAMINB12, FOLATE, FERRITIN, TIBC, IRON, RETICCTPCT in the last 72 hours. Urine analysis: No results found for: COLORURINE, APPEARANCEUR, LABSPEC, PHURINE, GLUCOSEU, HGBUR, BILIRUBINUR, KETONESUR, PROTEINUR, UROBILINOGEN, NITRITE, LEUKOCYTESUR Sepsis Labs: @LABRCNTIP (procalcitonin:4,lacticidven:4)  ) Recent Results (from the past 240 hour(s))  Blood Cultures x 2 sites     Status: None   Collection Time: 02/12/18  2:39 AM  Result Value Ref Range Status   Specimen Description   Final    BLOOD LEFT ARM Performed at [facility], [address], [place], [place] [address]    Special Requests   Final    IN PEDIATRIC BOTTLE Blood Culture adequate volume Performed at [facility], [address] [place], [place] [address]    Culture   Final    NO GROWTH 5 DAYS Performed at [facility], [address], [place], [place] [address]    Report Status 02/17/2018 FINAL  Final  Blood Cultures x 2 sites     Status: None   Collection Time: 02/12/18  2:39 AM  Result Value Ref Range Status    Specimen Description   Final    BLOOD RIGHT ANTECUBITAL Performed at [facility], [address], [place], [place] [address]    Special Requests   Final    BOTTLES DRAWN AEROBIC AND ANAEROBIC Blood Culture adequate volume Performed at [facility], [address] [place], [place] [address]    Culture   Final    NO GROWTH 5 DAYS Performed at [facility], [address], [place], [place] [address]    Report Status 02/17/2018 FINAL  Final  MRSA PCR Screening     Status: None   Collection Time: 02/13/18  7:48  PM  Result Value Ref Range Status   MRSA by PCR NEGATIVE NEGATIVE Final    Comment:        The GeneXpert MRSA Assay (FDA approved for NASAL specimens only), is one component of a comprehensive MRSA colonization surveillance program. It is not intended to diagnose MRSA infection nor to guide or monitor treatment for MRSA infections. Performed at [facility], [address] [place], [place] [address]       Studies: No results found.  Scheduled Meds: . amLODipine  5 mg Oral Daily  . aspirin EC  325 mg Oral Daily  . atorvastatin  20 mg Oral q1800  . clopidogrel  75 mg Oral Q breakfast  . folic acid  1 mg Oral Daily  . gabapentin  600 mg Oral BID  . insulin aspart  0-9 Units Subcutaneous TID WC  . insulin glargine  10 Units Subcutaneous QHS  . levETIRAcetam  500 mg Oral BID  . nutrition supplement (JUVEN)  1 packet Oral BID BM  . sodium chloride flush  3 mL Intravenous Q12H    Continuous Infusions: . sodium chloride    .  ceFAZolin (ANCEF) IV 1 g (02/18/18 0520)  . lactated ringers    . metronidazole 500 mg (02/18/18 1054)     LOS: 6 days     Briant Cedar, MD [facility]   If 7PM-7AM, please contact night-coverage [email or web address] Password [facility] 02/18/2018, 3:33 PM

## 2018-02-18 NOTE — Evaluation (Signed)
Physical Therapy Evaluation Patient Details Name: Luke Manning MRN: [id number] DOB: 06-Jul-1961 Today's Date: 02/18/2018   History of Present Illness  57yo male referred to the ED by the foot center secondary to R third toe gangrene, cellulitis with severe pain. He received surgery for amputation of his third R toe on 02/17/18. PMH DM, hx MI   Clinical Impression  Patient received in bed, pleasant but reporting significant pain in his R foot- "it feels like the dressing is too tight", willing to participate in PT evaluation this morning. He is able to perform functional bed mobility with S and extended time, requires  Min guard to MinA for all functional transfers and gait today. Note severely antalgic pattern, also tachycardia reaching 125BPM today with gait of only 67f; gait distance limited by pain and vitals response this session. He was left up in the chair with alarm set, all other needs met; PT spoke to RN and requested that dressing be checked due to patient reports of significant pain. Moving forward he may benefit from ST-SNF to address functional deficits; however note that if functional mobility improves as pain is controlled/improves, patient may be able to progress to being appropriate for HHPT. Will continue to follow closely.     Follow Up Recommendations SNF;Other (comment)(may consider HHPT if functional mobility improves during hospital stay )    Equipment Recommendations  Rolling walker with 5" wheels    Recommendations for Other Services       Precautions / Restrictions Precautions Precautions: Fall Precaution Comments: needs Darco shoe  Restrictions Weight Bearing Restrictions: Yes RLE Weight Bearing: Weight bearing as tolerated Other Position/Activity Restrictions: in Darco shoe       Mobility  Bed Mobility Overal bed mobility: Needs Assistance Bed Mobility: Supine to Sit     Supine to sit: Supervision     General bed mobility comments: increased time and  effort   Transfers Overall transfer level: Needs assistance Equipment used: Rolling walker (2 wheeled) Transfers: Sit to/from Stand Sit to Stand: Min assist         General transfer comment: light MInA to reach full standing position, cues for safety   Ambulation/Gait Ambulation/Gait assistance: Min guard Ambulation Distance (Feet): 3 Feet Assistive device: Rolling walker (2 wheeled) Gait Pattern/deviations: Step-to pattern;Decreased step length - left;Decreased stance time - right;Decreased stride length;Decreased dorsiflexion - right;Antalgic;Trunk flexed     General Gait Details: very antalgic pattern, patient easily fatigued and gait distance limited due to tachycardia reaching 125BPM with very short ambulation distance   Stairs            Wheelchair Mobility    Modified Rankin (Stroke Patients Only)       Balance Overall balance assessment: Mild deficits observed, not formally tested                                           Pertinent Vitals/Pain Pain Assessment: 0-10 Pain Score: 9  Pain Location: R foot- patient reports "it feels like my dressing is too tight and that's what's causing the pain"  Pain Descriptors / Indicators: Aching;Sharp;Sore Pain Intervention(s): Limited activity within patient's tolerance;Monitored during session;Repositioned;Utilized relaxation techniques    Home Living Family/patient expects to be discharged to:: Private residence Living Arrangements: Spouse/significant other Available Help at Discharge: Family Type of Home: House Home Access: Stairs to enter Entrance Stairs-Rails: Can reach both Entrance Stairs-Number of Steps:  4 Home Layout: One level Home Equipment: Cane - single point;Grab bars - tub/shower      Prior Function Level of Independence: Independent with assistive device(s)               Hand Dominance        Extremity/Trunk Assessment   Upper Extremity Assessment Upper Extremity  Assessment: Defer to OT evaluation    Lower Extremity Assessment Lower Extremity Assessment: Generalized weakness    Cervical / Trunk Assessment Cervical / Trunk Assessment: Normal  Communication   Communication: No difficulties  Cognition Arousal/Alertness: Awake/alert Behavior During Therapy: WFL for tasks assessed/performed Overall Cognitive Status: Within Functional Limits for tasks assessed                                        General Comments General comments (skin integrity, edema, etc.): mild balance deficit noted, steady but slow with RW and min guard     Exercises     Assessment/Plan    PT Assessment Patient needs continued PT services  PT Problem List Decreased strength;Decreased mobility;Decreased coordination;Decreased activity tolerance;Decreased balance;Pain       PT Treatment Interventions DME instruction;Therapeutic activities;Gait training;Therapeutic exercise;Patient/family education;Stair training;Balance training;Functional mobility training;Neuromuscular re-education    PT Goals (Current goals can be found in the Care Plan section)  Acute Rehab PT Goals Patient Stated Goal: to reduce pain  PT Goal Formulation: With patient Time For Goal Achievement: 03/04/18 Potential to Achieve Goals: Good    Frequency Min 3X/week   Barriers to discharge        Co-evaluation               AM-PAC PT "6 Clicks" Daily Activity  Outcome Measure Difficulty turning over in bed (including adjusting bedclothes, sheets and blankets)?: Unable Difficulty moving from lying on back to sitting on the side of the bed? : Unable Difficulty sitting down on and standing up from a chair with arms (e.g., wheelchair, bedside commode, etc,.)?: Unable Help needed moving to and from a bed to chair (including a wheelchair)?: A Little Help needed walking in hospital room?: A Little Help needed climbing 3-5 steps with a railing? : A Lot 6 Click Score: 11     End of Session Equipment Utilized During Treatment: Gait belt Activity Tolerance: Patient limited by pain Patient left: in chair;with chair alarm set;with call bell/phone within reach Nurse Communication: Mobility status;Other (comment)(patient c/o dressing being too tight and contributing to pain, requested RN please check dressing ) PT Visit Diagnosis: Unsteadiness on feet (R26.81);Muscle weakness (generalized) (M62.81);Difficulty in walking, not elsewhere classified (R26.2);Pain Pain - Right/Left: Right Pain - part of body: Ankle and joints of foot    Time: 3825-0539 PT Time Calculation (min) (ACUTE ONLY): 28 min   Charges:   PT Evaluation $PT Eval Low Complexity: 1 Low PT Treatments $Gait Training: 8-22 mins   PT G Codes:        Deniece Ree PT, DPT, CBIS  Supplemental Physical Therapist [place]   Pager [phone]

## 2018-02-18 NOTE — NC FL2 (Signed)
[place] MEDICAID FL2 LEVEL OF CARE SCREENING TOOL     IDENTIFICATION  Patient Name: Luke Manning Birthdate: 1961/09/24 Sex: male Admission Date (Current Location): 02/11/2018  [facility] [place] [facility] and [place] Number:  Producer, television/film/video and Address:  [facility] [place], [address], [place], [place] [address]      Provider Number: [id number]  Attending Physician Name and Address:  Briant Cedar, MD  Relative Name and Phone Number:  Jadd Gasior, spouse, [phone]    Current Level of Care: Hospital Recommended Level of Care: Skilled Nursing Facility Prior Approval Number:    Date Approved/Denied:   PASRR Number: [id number]  Discharge Plan: SNF    Current Diagnoses: Patient Active Problem List   Diagnosis Date Noted  . Gangrene of toe of right foot (HCC) 02/12/2018  . DM2 (diabetes mellitus, type 2) (HCC) 02/12/2018  . HTN (hypertension) 02/12/2018  . Seizure disorder (HCC) 02/12/2018  . Ischemic pain of foot, right 02/11/2018  . Gangrene of foot (HCC) 02/11/2018    Orientation RESPIRATION BLADDER Height & Weight     Self, Situation, Time, Place  Normal Continent Weight: 123 lb 3.8 oz (55.9 kg) Height:  5' 2.01" (157.5 cm)  BEHAVIORAL SYMPTOMS/MOOD NEUROLOGICAL BOWEL NUTRITION STATUS      Continent Diet(please see DC summary)  AMBULATORY STATUS COMMUNICATION OF NEEDS Skin   Extensive Assist Verbally Surgical wounds(R foot closed incision; R toe gangrene, amputated)                       Personal Care Assistance Level of Assistance  Bathing, Feeding, Dressing Bathing Assistance: Limited assistance Feeding assistance: Independent Dressing Assistance: Limited assistance     Functional Limitations Info  Sight, Hearing, Speech Sight Info: Adequate Hearing Info: Adequate Speech Info: Adequate    SPECIAL CARE FACTORS FREQUENCY  PT (By licensed PT), OT (By licensed OT)     PT Frequency: 5x/week OT Frequency: 5x/week             Contractures Contractures Info: Not present    Additional Factors Info  Code Status, Allergies, Insulin Sliding Scale Code Status Info: Full Allergies Info: No Known Allergies   Insulin Sliding Scale Info: insulin novolog 3x/day with meals, lantus at bedtime       Current Medications (02/18/2018):  This is the current hospital active medication list Current Facility-Administered Medications  Medication Dose Route Frequency Provider Last Rate Last Dose  . 0.9 %  sodium chloride infusion  250 mL Intravenous PRN Sherren Kerns, MD      . acetaminophen (TYLENOL) tablet 650 mg  650 mg Oral Q6H PRN Hillary Bow, DO   650 mg at 02/17/18 0827   Or  . acetaminophen (TYLENOL) suppository 650 mg  650 mg Rectal Q6H PRN Hillary Bow, DO      . acetaminophen (TYLENOL) tablet 650 mg  650 mg Oral Q4H PRN Sherren Kerns, MD   650 mg at 02/15/18 1555  . amLODipine (NORVASC) tablet 5 mg  5 mg Oral Daily Lyda Perone M, DO   5 mg at 02/18/18 1103  . aspirin EC tablet 325 mg  325 mg Oral Daily Sherren Kerns, MD   325 mg at 02/18/18 1100  . atorvastatin (LIPITOR) tablet 20 mg  20 mg Oral q1800 Briant Cedar, MD   20 mg at 02/17/18 1723  . ceFAZolin (ANCEF) IVPB 1 g/50 mL premix  1 g Intravenous Q8H Zonia Kief M, PA-C 100  mL/hr at 02/18/18 0520 1 g at 02/18/18 0520  . clopidogrel (PLAVIX) tablet 75 mg  75 mg Oral Q breakfast Sherren KernsFields, Charles E, MD   75 mg at 02/18/18 [id number]  . folic acid (FOLVITE) tablet 1 mg  1 mg Oral Daily Lyda PeroneGardner, Jared M, DO   1 mg at 02/18/18 1100  . gabapentin (NEURONTIN) capsule 600 mg  600 mg Oral BID Lyda PeroneGardner, Jared M, DO   600 mg at 02/18/18 1100  . hydrALAZINE (APRESOLINE) injection 5 mg  5 mg Intravenous Q20 Min PRN Sherren KernsFields, Charles E, MD      . HYDROmorphone (DILAUDID) injection 0.5 mg  0.5 mg Intravenous Q3H PRN Naida SleightOwens, James M, PA-C   0.5 mg at 02/18/18 1052  . insulin aspart (novoLOG) injection 0-9 Units  0-9 Units Subcutaneous TID WC [name], Jared  M, DO   1 Units at 02/16/18 1651  . insulin glargine (LANTUS) injection 10 Units  10 Units Subcutaneous QHS Briant CedarEzenduka, Nkeiruka J, MD   10 Units at 02/17/18 2235  . labetalol (NORMODYNE,TRANDATE) injection 10 mg  10 mg Intravenous Q10 min PRN Sherren KernsFields, Charles E, MD      . lactated ringers infusion   Intravenous Continuous Phillips Groutarignan, Peter, MD      . levETIRAcetam (KEPPRA) tablet 500 mg  500 mg Oral BID Lyda PeroneGardner, Jared M, DO   500 mg at 02/18/18 1059  . menthol-cetylpyridinium (CEPACOL) lozenge 3 mg  1 lozenge Oral PRN Audrea MuscatBlount, Xenia T, NP   3 mg at 02/17/18 2236  . metoCLOPramide (REGLAN) tablet 5-10 mg  5-10 mg Oral Q8H PRN Naida Sleightwens, James M, PA-C       Or  . metoCLOPramide (REGLAN) injection 5-10 mg  5-10 mg Intravenous Q8H PRN Zonia Kiefwens, James M, PA-C      . metroNIDAZOLE (FLAGYL) IVPB 500 mg  500 mg Intravenous Q8H Lyda PeroneGardner, Jared M, DO 100 mL/hr at 02/18/18 1054 500 mg at 02/18/18 1054  . morphine 4 MG/ML injection 2 mg  2 mg Intravenous Q1H PRN Sherren KernsFields, Charles E, MD   2 mg at 02/17/18 1544  . nutrition supplement (JUVEN) (JUVEN) powder packet 1 packet  1 packet Oral BID BM Briant CedarEzenduka, Nkeiruka J, MD   1 packet at 02/18/18 1057  . ondansetron (ZOFRAN) injection 4 mg  4 mg Intravenous Q6H PRN Sherren KernsFields, Charles E, MD   4 mg at 02/17/18 1424  . oxyCODONE (Oxy IR/ROXICODONE) immediate release tablet 5-10 mg  5-10 mg Oral Q4H PRN Sherren KernsFields, Charles E, MD   10 mg at 02/18/18 [id number]  . sodium chloride flush (NS) 0.9 % injection 3 mL  3 mL Intravenous Q12H Sherren KernsFields, Charles E, MD   3 mL at 02/18/18 1054  . sodium chloride flush (NS) 0.9 % injection 3 mL  3 mL Intravenous PRN Sherren KernsFields, Charles E, MD      . tiZANidine (ZANAFLEX) tablet 8 mg  8 mg Oral Q8H PRN [name], Jared M, DO         Discharge Medications: Please see discharge summary for a list of discharge medications.  Relevant Imaging Results:  Relevant Lab Results:   Additional Information SSN: [id number]  Abigail ButtsSusan Susie Ehresman, LCSW

## 2018-02-18 NOTE — Progress Notes (Signed)
Patient stable for discharge from a vascular perspective.  I will arrange outpatient follow up in 1 month.  I will sign off.  Please contact for additional questions.  He will need Rx for Plavix  Luke Manning

## 2018-02-18 NOTE — Progress Notes (Signed)
Patient standing beside bed, had incontinent episode.  Patient cleaned and clean gown applied, patient assisted back to bed.  Urinal put with in reach.  Dressing change completed as dressing was saturated with urine.  Xeroform gauze, Kerlex and coband applied to right foot surgical site.  Bed alarm set on bed.

## 2018-02-19 ENCOUNTER — Telehealth: Payer: Self-pay | Admitting: Surgery

## 2018-02-19 ENCOUNTER — Inpatient Hospital Stay (HOSPITAL_COMMUNITY): Payer: PPO

## 2018-02-19 DIAGNOSIS — I96 Gangrene, not elsewhere classified: Secondary | ICD-10-CM

## 2018-02-19 LAB — URINALYSIS, ROUTINE W REFLEX MICROSCOPIC
BACTERIA UA: NONE SEEN
BILIRUBIN URINE: NEGATIVE
Glucose, UA: 150 mg/dL — AB
Hgb urine dipstick: NEGATIVE
KETONES UR: 5 mg/dL — AB
NITRITE: NEGATIVE
PH: 5 (ref 5.0–8.0)
Protein, ur: NEGATIVE mg/dL
SPECIFIC GRAVITY, URINE: 1.02 (ref 1.005–1.030)

## 2018-02-19 LAB — BASIC METABOLIC PANEL
ANION GAP: 9 (ref 5–15)
BUN: 17 mg/dL (ref 6–20)
CHLORIDE: 102 mmol/L (ref 101–111)
CO2: 25 mmol/L (ref 22–32)
Calcium: 8.7 mg/dL — ABNORMAL LOW (ref 8.9–10.3)
Creatinine, Ser: 1.15 mg/dL (ref 0.61–1.24)
GFR calc non Af Amer: >60 mL/min (ref >60)
GLUCOSE: 109 mg/dL — AB (ref 65–99)
Potassium: 2.9 mmol/L — ABNORMAL LOW (ref 3.5–5.1)
Sodium: 136 mmol/L (ref 135–145)

## 2018-02-19 LAB — CBC WITH DIFFERENTIAL/PLATELET
BAND NEUTROPHILS: 14 %
BASOS ABS: 0 10*3/uL (ref 0.0–0.1)
BLASTS: 0 %
Basophils Relative: 0 %
EOS ABS: 0.1 10*3/uL (ref 0.0–0.7)
Eosinophils Relative: 1 %
HEMATOCRIT: 27.7 % — AB (ref 39.0–52.0)
HEMOGLOBIN: 9 g/dL — AB (ref 13.0–17.0)
LYMPHS PCT: 11 %
Lymphs Abs: 1.6 10*3/uL (ref 0.7–4.0)
MCH: 26.1 pg (ref 26.0–34.0)
MCHC: 32.5 g/dL (ref 30.0–36.0)
MCV: 80.3 fL (ref 78.0–100.0)
METAMYELOCYTES PCT: 0 %
MONOS PCT: 4 %
Monocytes Absolute: 0.6 10*3/uL (ref 0.1–1.0)
Myelocytes: 0 %
NEUTROS ABS: 12.5 10*3/uL — AB (ref 1.7–7.7)
Neutrophils Relative %: 70 %
OTHER: 0 %
PROMYELOCYTES ABS: 0 %
Platelets: 364 10*3/uL (ref 150–400)
RBC: 3.45 MIL/uL — AB (ref 4.22–5.81)
RDW: 15.1 % (ref 11.5–15.5)
WBC: 14.8 10*3/uL — AB (ref 4.0–10.5)
nRBC: 0 /100 WBC

## 2018-02-19 LAB — GLUCOSE, CAPILLARY
GLUCOSE-CAPILLARY: 185 mg/dL — AB (ref 65–99)
GLUCOSE-CAPILLARY: 130 mg/dL — AB (ref 65–99)
Glucose-Capillary: 117 mg/dL — ABNORMAL HIGH (ref 65–99)
Glucose-Capillary: 181 mg/dL — ABNORMAL HIGH (ref 65–99)

## 2018-02-19 MED ORDER — POTASSIUM CHLORIDE CRYS ER 20 MEQ PO TBCR
40.0000 meq | EXTENDED_RELEASE_TABLET | Freq: Four times a day (QID) | ORAL | Status: AC
  Administered 2018-02-19 (×2): 40 meq via ORAL
  Filled 2018-02-19 (×2): qty 2

## 2018-02-19 MED ORDER — CEFAZOLIN SODIUM-DEXTROSE 1-4 GM/50ML-% IV SOLN
1.0000 g | Freq: Three times a day (TID) | INTRAVENOUS | Status: DC
  Administered 2018-02-19 – 2018-02-22 (×9): 1 g via INTRAVENOUS
  Filled 2018-02-19 (×10): qty 50

## 2018-02-19 NOTE — Telephone Encounter (Signed)
-----   Message from Sharee PimpleMarilyn K McChesney, RN sent at 02/19/2018  8:43 AM EST ----- Regarding: 1 month w/ labs   ----- Message ----- From: Nada LibmanBrabham, Vance W, MD Sent: 02/18/2018   5:31 PM To: Vvs Charge Pool  F/u 1 month with R LE duplex and ABI, to see Rosalita ChessmanSuzanne

## 2018-02-19 NOTE — Progress Notes (Addendum)
CSW met with patient at bedside and gave SNF offers. CSW and patient called patient's wife and spoke to her together on the phone. Patient and wife have decided for patient to discharge home with home health services; they decline SNF. Patient requested help getting a walker to use at home. CSW referred patient to [facility] for home health and DME needs. CSW signing off.  Estanislado Emms, [place]

## 2018-02-19 NOTE — Care Management Note (Addendum)
Case Management Note  Patient Details  Name: Luke Manning MRN: [id number] Date of Birth: 06/20/1961  Subjective/Objective:  Pt presented as a transfer from [facility]. Pt presented for gangrenous right third toe.Vascular consulted-ABI and Angioplasty performed-pt is now s/p Right third toe amputation 02-17-18. PTA from home with the support of wife. PT/OT recommendations for SNF- Pt has refused SNF at this time. Pt wants to return home with wife.                  Action/Plan: CM did offer choice to patient and he wants to discuss agency list with his wife. CM did reach out to MD to see if pt is stable for d/c. Pt will follow up with vascular in one month per MD notes. Pt will need orders for DME RW and HH RN, PT, OT with F2F.   Expected Discharge Date:                  Expected Discharge Plan:  Home w Home Health Services  In-House Referral:  Clinical Social Work(Pt refused SNF)  Discharge planning Services  CM Consult  Post Acute Care Choice:  Home Health, Durable Medical Equipment Choice offered to:  Patient, Spouse  DME Arranged:  Walker rolling, 3n1 DME Agency:  [facility]  HH Arranged:  RN, Disease Management, PT, OT, Refused SNF HH Agency:   [facility]  Status of Service:  Completed, signed off  If discussed at Long Length of Stay Meetings, dates discussed:    Additional Comments: 1103 02-22-18 Luke BambergerBrenda Graves-Bigelow, RN, BSN [phone] CM did call [facility] for cost of Flagyl and Keflex- total for Flagyl $3.40, Keflex $3.40. CM will make Pt, MD & Staff RN aware. No further needs from CM at this time.     1134 02-21-18 Luke BambergerBrenda Graves-Bigelow, RN,BSN [phone] CM was able to speak with wife Luke Manning in regards to disposition needs. Pt will need RW and 3n1-wife stated ok to use [facility] for [facility] and [facility] for Skilled Services. CM did make referral with Adacia Liaison for [facility] and SOC to begin within 24-48 hours post d/c. [facility]  regards to D[facility]- to be delivered to room prior to d/c. No further needs from CM at this time.  Gala LewandowskyGraves-Bigelow, Ouida Abeyta Kaye, RN 02/19/2018, 11:17 AM

## 2018-02-19 NOTE — Progress Notes (Signed)
   Subjective: 2 Days Post-Op Procedure(s) (LRB): AMPUTATION, RIGHT 3RD TOE (Right) Patient reports pain as mild.    Objective: Vital signs in last 24 hours: Temp:  [99.5 F (37.5 C)-100.4 F (38 C)] 99.6 F (37.6 C) (02/27 2100) Pulse Rate:  [104-109] 105 (02/27 2100) Resp:  [17-19] 17 (02/27 2100) BP: (122-133)/(66-78) 125/75 (02/27 2100) SpO2:  [98 %-100 %] 99 % (02/27 2100) Weight:  [123 lb 10.9 oz (56.1 kg)] 123 lb 10.9 oz (56.1 kg) (02/27 0508)  Intake/Output from previous day: 02/26 0701 - 02/27 0700 In: 1823 [P.O.:1020; I.V.:3; IV Piggyback:800] Out: 640 [Urine:640] Intake/Output this shift: Total I/O In: 3 [I.V.:3] Out: -   Recent Labs    02/17/18 0404 02/18/18 0330 02/19/18 0326  HGB 8.6* 8.8* 9.0*   Recent Labs    02/18/18 0330 02/19/18 0326  WBC 10.5 14.8*  RBC 3.50* 3.45*  HCT 27.7* 27.7*  PLT 308 364   Recent Labs    02/18/18 0330 02/19/18 0326  NA 136 136  K 3.2* 2.9*  CL 105 102  CO2 22 25  BUN 11 17  CREATININE 1.12 1.15  GLUCOSE 114* 109*  CALCIUM 8.5* 8.7*   No results for input(s): LABPT, INR in the last 72 hours.  toe amputation incision no drainage. no cellulitis Dg Chest 2 View  Result Date: 02/19/2018 CLINICAL DATA:  Fever EXAM: CHEST  2 VIEW COMPARISON:  08/27/2013 FINDINGS: Linear atelectasis or scarring in the lower lungs. Tiny pleural effusions. No focal consolidation. Trace fluid along the right pulmonary fissure. Cardiomediastinal silhouette within normal limits. No pneumothorax. IMPRESSION: 1. Small pleural effusions with linear atelectasis or scarring at the bilateral lung bases. 2. No focal pulmonary airspace disease. Electronically Signed   By: Jasmine PangKim  Fujinaga M.D.   On: 02/19/2018 15:16    Assessment/Plan: 2 Days Post-Op Procedure(s) (LRB): AMPUTATION, RIGHT 3RD TOE (Right) Plan:  Ambulation with darco shoe. He will do better with regular shoe on opposite foot. Office one week.   Eldred MangesMark C Lyndsie Wallman 02/19/2018, 9:43  PM

## 2018-02-19 NOTE — Progress Notes (Addendum)
[facility]  Subjective  - Patient reports no fever or chills since surgery.     Objective 122/78 (!) 104 (!) 100.4 F (38 C) (Oral) 19 98%  Intake/Output Summary (Last 24 hours) at 02/19/2018 1012 Last data filed at 02/19/2018 0900 Gross per 24 hour  Intake 1883 ml  Output 640 ml  Net 1243 ml    Right third toe amputation healing well, no evidence of drainage at incision site, good skin lines with significantly reduced edema from admission.    Slight erythema on dorsum of foot below incision, again significantly improved from pre surgery.    Right foot skin is not abnormally warm to touch.   Doppler signals PT/peroneal brisk > DP right LE Left groin soft without hematoma or erythema Oral temp 101.4-100.4 past 24 hours HR 110 tachy      Assessment/Planning: S/P angiogram intervention right mid superficial femoral artery angioplasty with drug-eluting balloon to 0 residual stenosis followed by third toe amputation by Dr. Ophelia CharterYates 02/17/2018.     Patent arterial flow with brisk doppler signals. He has a fever with elevated temp and WBC increasing since admissions, now 14.8.  Ancef was started 02/17/2018 1 g q8 for 48 hours. For surgical prophylaxis.   The left groin has no erythema or edema at stick site.  I do not think there is a problem post angiogram to indicate that there is a groin infection.  The right foot has evidence of residual cellulitis, but this has shown improvement from his admission.    We may need to look for other sources with a chest x ray and UA.       Mosetta Pigeonmma Maureen Collins 02/19/2018 10:12 AM --  Laboratory Lab Results: Recent Labs    02/18/18 0330 02/19/18 0326  WBC 10.5 14.8*  HGB 8.8* 9.0*  HCT 27.7* 27.7*  PLT 308 364   BMET Recent Labs    02/18/18 0330 02/19/18 0326  NA 136 136  K 3.2* 2.9*  CL 105 102  CO2 22 25  GLUCOSE 114* 109*  BUN 11 17  CREATININE 1.12 1.15  CALCIUM 8.5* 8.7*     COAG Lab Results  Component Value Date   INR 1.22 02/14/2018   No results found for: PTT

## 2018-02-19 NOTE — Telephone Encounter (Signed)
Sched labs 03/21/18 at 3:00 and NP 03/24/18 at 1:15. Spoke to pt's wife to inform them of appts.

## 2018-02-19 NOTE — Progress Notes (Signed)
PROGRESS NOTE    Luke Manning  ZOX:[id number] DOB: 01/02/1961 DOA: 02/11/2018 PCP: Jackie Plumsei-Bonsu, George, MD    Brief Narrative: 57 year old male with past medical history of diabetes mellitus type 2 on oral medications presented with complains of pain in the right third toe, ongoing for the past 1 month. Pt went to see his podiatrist who recommended that he come into the hospital for further evaluation. In the ED, toe noted to be gangrenous. Pt admitted for further management.    Assessment & Plan:   Principal Problem:   Gangrene of toe of right foot (HCC) Active Problems:   DM2 (diabetes mellitus, type 2) (HCC)   HTN (hypertension)   Seizure disorder (HCC)  1-Right foot cellulitis and gangrenous 3 rd toe;  X ray foot; no concern for osteomyelitis.  Blood culture negative.  On cefazolin order for 2 days post op, need to reassess needs for IV antibiotics. and flagyl.  On ancef and flagyl.   Fever, leukocytosis;  I have ask Vascular to follow on patient and evaluate right foot.  Will check Chest x ray and UA>   Hypokalemia; replete orally with 40 meq times 3.  check mg level.   PVD; ABI abnormal, R ABI 0.46, Had angiogram with successful balloon angioplasty to the R superficial femoral artery on 02/14/18 Continue with plavix.   Anemia;  Iron panel: Fe 64, TIBC 200, sats 32, ferritin 549, AOCD S/P Transfused 1U of PRBC on 02/16/18 Hb stable at 9.   HTN; continue with Norvasc.    History of seizure; continue with keppra.        DVT prophylaxis: lovenox Code Status: full code.  Family Communication: care discussed with patient  Disposition Plan: home when afebrile and leukocytosis improved.   Consultants:   Vascular    Procedures:     Antimicrobials:  Ancef.    Subjective: He denies cough, dysuria, diarrhea.    Objective: Vitals:   02/18/18 2143 02/19/18 0055 02/19/18 0508 02/19/18 0839  BP: (!) 125/54  124/66 122/78  Pulse: (!) 111  (!) 104     Resp: 15  19   Temp: (!) 101.4 F (38.6 C) 99.6 F (37.6 C) (!) 100.4 F (38 C)   TempSrc: Oral Oral Oral   SpO2: 98%  98%   Weight:   56.1 kg (123 lb 10.9 oz)   Height:        Intake/Output Summary (Last 24 hours) at 02/19/2018 0917 Last data filed at 02/19/2018 0510 Gross per 24 hour  Intake 1643 ml  Output 640 ml  Net 1003 ml   Filed Weights   02/17/18 1320 02/18/18 0514 02/19/18 0508  Weight: 51 kg (112 lb 8 oz) 55.9 kg (123 lb 3.8 oz) 56.1 kg (123 lb 10.9 oz)    Examination:  General exam: Appears calm and comfortable  Respiratory system: Clear to auscultation. Respiratory effort normal. Cardiovascular system: S1 & S2 heard, RRR. No JVD, murmurs, rubs, gallops or clicks. No pedal edema. Gastrointestinal system: Abdomen is nondistended, soft and nontender. No organomegaly or masses felt. Normal bowel sounds heard. Central nervous system: Alert and oriented. No focal neurological deficits. Extremities: Symmetric 5 x 5 power. Right foot with clean dressing.  Skin: No rashes, lesions or ulcers Psychiatry: Judgement and insight appear normal. Mood & affect appropriate.     Data Reviewed: I have personally reviewed following labs and imaging studies  CBC: Recent Labs  Lab 02/15/18 0309 02/16/18 0617 02/17/18 0404 02/18/18 0330 02/19/18 0326  WBC 8.5  9.3 10.3 10.5 14.8*  NEUTROABS 6.5 7.0 7.3 7.9* 12.5*  HGB 7.8* 7.0* 8.6* 8.8* 9.0*  HCT 25.5* 23.1* 27.1* 27.7* 27.7*  MCV 76.3* 77.0* 79.2 79.1 80.3  PLT 265 283 269 308 364   Basic Metabolic Panel: Recent Labs  Lab 02/15/18 0309 02/16/18 0617 02/17/18 0404 02/18/18 0330 02/19/18 0326  NA 133* 137 136 136 136  K 3.8 3.3* 3.1* 3.2* 2.9*  CL 103 105 104 105 102  CO2 21* 21* 23 22 25   GLUCOSE 149* 87 84 114* 109*  BUN 13 18 12 11 17   CREATININE 1.15 1.19 1.17 1.12 1.15  CALCIUM 8.6* 8.5* 8.5* 8.5* 8.7*   GFR: Estimated Creatinine Clearance: 54.1 mL/min (by C-G formula based on SCr of 1.15 mg/dL). Liver  Function Tests: No results for input(s): AST, ALT, ALKPHOS, BILITOT, PROT, ALBUMIN in the last 168 hours. No results for input(s): LIPASE, AMYLASE in the last 168 hours. No results for input(s): AMMONIA in the last 168 hours. Coagulation Profile: Recent Labs  Lab 02/14/18 0633  INR 1.22   Cardiac Enzymes: No results for input(s): CKTOTAL, CKMB, CKMBINDEX, TROPONINI in the last 168 hours. BNP (last 3 results) No results for input(s): PROBNP in the last 8760 hours. HbA1C: No results for input(s): HGBA1C in the last 72 hours. CBG: Recent Labs  Lab 02/18/18 0803 02/18/18 1137 02/18/18 1619 02/18/18 2146 02/19/18 0749  GLUCAP 91 100* 129* 141* 117*   Lipid Profile: No results for input(s): CHOL, HDL, LDLCALC, TRIG, CHOLHDL, LDLDIRECT in the last 72 hours. Thyroid Function Tests: No results for input(s): TSH, T4TOTAL, FREET4, T3FREE, THYROIDAB in the last 72 hours. Anemia Panel: No results for input(s): VITAMINB12, FOLATE, FERRITIN, TIBC, IRON, RETICCTPCT in the last 72 hours. Sepsis Labs: No results for input(s): PROCALCITON, LATICACIDVEN in the last 168 hours.  Recent Results (from the past 240 hour(s))  Blood Cultures x 2 sites     Status: None   Collection Time: 02/12/18  2:39 AM  Result Value Ref Range Status   Specimen Description   Final    BLOOD LEFT ARM Performed at [facility], [address] [place], [place] [address]    Special Requests   Final    IN PEDIATRIC BOTTLE Blood Culture adequate volume Performed at [facility], [address], [place], [place] [address]    Culture   Final    NO GROWTH 5 DAYS Performed at [facility], [address], [place], [place] [address]    Report Status 02/17/2018 FINAL  Final  Blood Cultures x 2 sites     Status: None   Collection Time: 02/12/18  2:39 AM  Result Value Ref Range Status   Specimen Description   Final    BLOOD RIGHT ANTECUBITAL Performed at [facility], [address], [place], [place] [address]    Special Requests   Final    BOTTLES DRAWN AEROBIC AND ANAEROBIC Blood Culture adequate volume Performed at [facility], [address], [place], [place] [address]    Culture   Final    NO GROWTH 5 DAYS Performed at [facility], [address] [place], [place] [address]    Report Status 02/17/2018 FINAL  Final  MRSA PCR Screening     Status: None   Collection Time: 02/13/18  7:48 PM  Result Value Ref Range Status   MRSA by PCR NEGATIVE NEGATIVE Final    Comment:  The GeneXpert MRSA Assay (FDA approved for NASAL specimens only), is one component of a comprehensive MRSA colonization surveillance program. It is not intended to diagnose MRSA infection nor to guide or monitor treatment for MRSA infections. Performed at [facility], [address], [place], [place] [address]          Radiology Studies: No results found.      Scheduled Meds: . amLODipine  5 mg Oral Daily  . aspirin EC  325 mg Oral Daily  . atorvastatin  20 mg Oral q1800  . clopidogrel  75 mg Oral Q breakfast  . docusate sodium  100 mg Oral BID  . enoxaparin (LOVENOX) injection  40 mg Subcutaneous Q24H  . folic acid  1 mg Oral Daily  . gabapentin  600 mg Oral BID  . insulin aspart  0-9 Units Subcutaneous TID WC  . insulin glargine  10 Units Subcutaneous QHS  . levETIRAcetam  500 mg Oral BID  . nutrition supplement (JUVEN)  1 packet Oral BID BM  . potassium chloride  40 mEq Oral Q6H  . sodium chloride flush  3 mL Intravenous Q12H   Continuous Infusions: . sodium chloride    .  ceFAZolin (ANCEF) IV 1 g (02/19/18 4782)  . lactated ringers    . metronidazole 500 mg (02/19/18 0839)     LOS: 7 days    Time spent: 35 minutes,     Alba Cory, MD [facility] Pager [phone]  If 7PM-7AM, please contact night-coverage [email or web address] Password [facility] 02/19/2018, 9:17 AM

## 2018-02-20 LAB — BASIC METABOLIC PANEL
ANION GAP: 9 (ref 5–15)
BUN: 17 mg/dL (ref 6–20)
CHLORIDE: 106 mmol/L (ref 101–111)
CO2: 23 mmol/L (ref 22–32)
Calcium: 8.6 mg/dL — ABNORMAL LOW (ref 8.9–10.3)
Creatinine, Ser: 1.14 mg/dL (ref 0.61–1.24)
GFR calc Af Amer: >60 mL/min (ref >60)
GFR calc non Af Amer: >60 mL/min (ref >60)
GLUCOSE: 97 mg/dL (ref 65–99)
POTASSIUM: 3.5 mmol/L (ref 3.5–5.1)
Sodium: 138 mmol/L (ref 135–145)

## 2018-02-20 LAB — TYPE AND SCREEN
ABO/RH(D): A POS
ANTIBODY SCREEN: NEGATIVE
UNIT DIVISION: 0
Unit division: 0
Unit division: 0

## 2018-02-20 LAB — CBC WITH DIFFERENTIAL/PLATELET
Basophils Absolute: 0 10*3/uL (ref 0.0–0.1)
Basophils Relative: 0 %
EOS PCT: 3 %
Eosinophils Absolute: 0.4 10*3/uL (ref 0.0–0.7)
HEMATOCRIT: 24.7 % — AB (ref 39.0–52.0)
Hemoglobin: 7.9 g/dL — ABNORMAL LOW (ref 13.0–17.0)
LYMPHS ABS: 2 10*3/uL (ref 0.7–4.0)
Lymphocytes Relative: 15 %
MCH: 25.6 pg — ABNORMAL LOW (ref 26.0–34.0)
MCHC: 32 g/dL (ref 30.0–36.0)
MCV: 79.9 fL (ref 78.0–100.0)
MONOS PCT: 7 %
Monocytes Absolute: 0.9 10*3/uL (ref 0.1–1.0)
Neutro Abs: 9.7 10*3/uL — ABNORMAL HIGH (ref 1.7–7.7)
Neutrophils Relative %: 75 %
Platelets: 341 10*3/uL (ref 150–400)
RBC: 3.09 MIL/uL — AB (ref 4.22–5.81)
RDW: 15.5 % (ref 11.5–15.5)
WBC: 13 10*3/uL — AB (ref 4.0–10.5)

## 2018-02-20 LAB — MAGNESIUM: Magnesium: 1.9 mg/dL (ref 1.7–2.4)

## 2018-02-20 LAB — BPAM RBC
BLOOD PRODUCT EXPIRATION DATE: 201903062359
BLOOD PRODUCT EXPIRATION DATE: 201903092359
Blood Product Expiration Date: 201903062359
ISSUE DATE / TIME: 201902241750
UNIT TYPE AND RH: 6200
Unit Type and Rh: 6200
Unit Type and Rh: 6200

## 2018-02-20 LAB — HEMOGLOBIN AND HEMATOCRIT, BLOOD
HEMATOCRIT: 28.6 % — AB (ref 39.0–52.0)
HEMOGLOBIN: 9.1 g/dL — AB (ref 13.0–17.0)

## 2018-02-20 LAB — GLUCOSE, CAPILLARY
Glucose-Capillary: 91 mg/dL (ref 65–99)
Glucose-Capillary: 139 mg/dL — ABNORMAL HIGH (ref 65–99)
Glucose-Capillary: 180 mg/dL — ABNORMAL HIGH (ref 65–99)
Glucose-Capillary: 155 mg/dL — ABNORMAL HIGH (ref 65–99)

## 2018-02-20 MED ORDER — FERROUS SULFATE 325 (65 FE) MG PO TABS
325.0000 mg | ORAL_TABLET | Freq: Two times a day (BID) | ORAL | Status: DC
  Administered 2018-02-20 – 2018-02-22 (×4): 325 mg via ORAL
  Filled 2018-02-20 (×4): qty 1

## 2018-02-20 MED ORDER — POTASSIUM CHLORIDE CRYS ER 20 MEQ PO TBCR
40.0000 meq | EXTENDED_RELEASE_TABLET | Freq: Once | ORAL | Status: AC
  Administered 2018-02-20: 40 meq via ORAL
  Filled 2018-02-20: qty 2

## 2018-02-20 NOTE — Progress Notes (Signed)
Nutrition Follow-up  DOCUMENTATION CODES:   Not applicable  INTERVENTION:    Continue Juven 1 packet BID, each packet provides 80 calories, 8 grams of carbohydrate, and 14 grams of amino acids; supplement contains CaHMB, glutamine, and arginine, to promote wound healing  NUTRITION DIAGNOSIS:   Increased nutrient needs related to wound healing as evidenced by estimated needs.  Ongoing  GOAL:   Patient will meet greater than or equal to 90% of their needs  Met  MONITOR:   PO intake, Supplement acceptance, Labs, Weight trends, Skin, I & O's  ASSESSMENT:   Luke Manning is a 57 y.o. male with medical history significant of DM2.  Patient sent in from foot center with c/o R 3rd toe gangrene and cellulitis of fore foot.  This ongoing for past month.  Pain is 10/10, severe.  S/P R third toe amputation on 2/25. Spoke with patient who reports that he has been eating very well. 70-100% meal completion documented. He is drinking Juven BID to support wound healing. He is hopeful for discharge home soon. Labs and medications reviewed.  Diet Order:  Diet heart healthy/carb modified Room service appropriate? Yes; Fluid consistency: Thin  EDUCATION NEEDS:   No education needs have been identified at this time  Skin:  Skin Assessment: Skin Integrity Issues: Skin Integrity Issues:: Other (Comment) Other: S/P R third toe amputation 2/25  Last BM:  2/27  Height:   Ht Readings from Last 1 Encounters:  02/17/18 5' 2.01" (1.575 m)    Weight:   Wt Readings from Last 1 Encounters:  02/20/18 114 lb 12.8 oz (52.1 kg)    Ideal Body Weight:  50 kg  BMI:  Body mass index is 20.99 kg/m.  Estimated Nutritional Needs:   Kcal:  1700-1900  Protein:  85-100 grams  Fluid:  1.7-1.9 L    Molli Barrows, RD, LDN, CNSC Pager [phone] After Hours Pager [phone]

## 2018-02-20 NOTE — Progress Notes (Addendum)
PROGRESS NOTE    Wheeler Incorvaia  AVW:[id number] DOB: 07/27/1961 DOA: 02/11/2018 PCP: Jackie Plum, MD    Brief Narrative: 57 year old male with past medical history of diabetes mellitus type 2 on oral medications presented with complains of pain in the right third toe, ongoing for the past 1 month. Pt went to see his podiatrist who recommended that he come into the hospital for further evaluation. In the ED, toe noted to be gangrenous. Pt admitted for further management.    Assessment & Plan:   Principal Problem:   Gangrene of toe of right foot (HCC) Active Problems:   DM2 (diabetes mellitus, type 2) (HCC)   HTN (hypertension)   Seizure disorder (HCC)  1-Right foot cellulitis and gangrenous 3 rd toe;  X ray foot; no concern for osteomyelitis.  Blood culture negative.  On ancef and flagyl.  WBC trending down. Keep another day on IV antibiotics. discharge tomorrow if WBC trends down.   Fever, leukocytosis; afebrile.  Evaluated by vascular,. No worsening foot infection/  Afebrile.  Chest x ray and ua negative.  On IV antibiotics.   Hypokalemia; normalized.  Mg normal at 1.9.  Resolved.   PVD; ABI abnormal, R ABI 0.46, Had angiogram with successful balloon angioplasty to the R superficial femoral artery on 02/14/18 Continue with plavix.   Anemia;  Iron panel: Fe 64, TIBC 200, sats 32, ferritin 549, AOCD S/P Transfused 1U of PRBC on 02/16/18 Repeated hb this afternoon at 9 Check B 12 level.   HTN; continue with Norvasc.    History of seizure; continue with keppra.        DVT prophylaxis: lovenox Code Status: full code.  Family Communication: care discussed with patient  Disposition Plan: home 3-01 of WBC decreased further.   Consultants:   Vascular    Procedures:     Antimicrobials:  Ancef.    Subjective: He is feeling better. He wants to go home and no SNF> denies chest pain, cough or dyspnea. No diarrhea.   Objective: Vitals:   02/19/18  2100 02/20/18 0521 02/20/18 1015 02/20/18 1308  BP: 125/75 122/78 116/83 120/80  Pulse: (!) 105 100  (!) 109  Resp: 17 (!) 21  (!) 22  Temp: 99.6 F (37.6 C) 99.1 F (37.3 C)  99.3 F (37.4 C)  TempSrc: Oral Oral  Oral  SpO2: 99% 100%  100%  Weight:  52.1 kg (114 lb 12.8 oz)    Height:        Intake/Output Summary (Last 24 hours) at 02/20/2018 1456 Last data filed at 02/20/2018 1000 Gross per 24 hour  Intake 1743 ml  Output -  Net 1743 ml   Filed Weights   02/18/18 0514 02/19/18 0508 02/20/18 0521  Weight: 55.9 kg (123 lb 3.8 oz) 56.1 kg (123 lb 10.9 oz) 52.1 kg (114 lb 12.8 oz)    Examination:  General exam: NAD Respiratory system: CTA, no crackles.  Cardiovascular system: S 1, S 2 RRR Gastrointestinal system: BS present, soft, nt Central nervous system: non focal.  Extremities: right foot with clean dressing.  Skin: No rash.  Psychiatry: Mood and affect appropriate.     Data Reviewed: I have personally reviewed following labs and imaging studies  CBC: Recent Labs  Lab 02/16/18 0617 02/17/18 0404 02/18/18 0330 02/19/18 0326 02/20/18 0410 02/20/18 1402  WBC 9.3 10.3 10.5 14.8* 13.0*  --   NEUTROABS 7.0 7.3 7.9* 12.5* 9.7*  --   HGB 7.0* 8.6* 8.8* 9.0* 7.9* 9.1*  HCT 23.1*  27.1* 27.7* 27.7* 24.7* 28.6*  MCV 77.0* 79.2 79.1 80.3 79.9  --   PLT 283 269 308 364 341  --    Basic Metabolic Panel: Recent Labs  Lab 02/16/18 0617 02/17/18 0404 02/18/18 0330 02/19/18 0326 02/20/18 0410  NA 137 136 136 136 138  K 3.3* 3.1* 3.2* 2.9* 3.5  CL 105 104 105 102 106  CO2 21* 23 22 25 23   GLUCOSE 87 84 114* 109* 97  BUN 18 12 11 17 17   CREATININE 1.19 1.17 1.12 1.15 1.14  CALCIUM 8.5* 8.5* 8.5* 8.7* 8.6*  MG  --   --   --   --  1.9   GFR: Estimated Creatinine Clearance: 52 mL/min (by C-G formula based on SCr of 1.14 mg/dL). Liver Function Tests: No results for input(s): AST, ALT, ALKPHOS, BILITOT, PROT, ALBUMIN in the last 168 hours. No results for input(s):  LIPASE, AMYLASE in the last 168 hours. No results for input(s): AMMONIA in the last 168 hours. Coagulation Profile: Recent Labs  Lab 02/14/18 0633  INR 1.22   Cardiac Enzymes: No results for input(s): CKTOTAL, CKMB, CKMBINDEX, TROPONINI in the last 168 hours. BNP (last 3 results) No results for input(s): PROBNP in the last 8760 hours. HbA1C: No results for input(s): HGBA1C in the last 72 hours. CBG: Recent Labs  Lab 02/19/18 1116 02/19/18 1627 02/19/18 2059 02/20/18 0741 02/20/18 1156  GLUCAP 181* 185* 130* 91 139*   Lipid Profile: No results for input(s): CHOL, HDL, LDLCALC, TRIG, CHOLHDL, LDLDIRECT in the last 72 hours. Thyroid Function Tests: No results for input(s): TSH, T4TOTAL, FREET4, T3FREE, THYROIDAB in the last 72 hours. Anemia Panel: No results for input(s): VITAMINB12, FOLATE, FERRITIN, TIBC, IRON, RETICCTPCT in the last 72 hours. Sepsis Labs: No results for input(s): PROCALCITON, LATICACIDVEN in the last 168 hours.  Recent Results (from the past 240 hour(s))  Blood Cultures x 2 sites     Status: None   Collection Time: 02/12/18  2:39 AM  Result Value Ref Range Status   Specimen Description   Final    BLOOD LEFT ARM Performed at [facility], [address] [place], [place] [address]    Special Requests   Final    IN PEDIATRIC BOTTLE Blood Culture adequate volume Performed at [facility], [address] [place], [place] [address]    Culture   Final    NO GROWTH 5 DAYS Performed at [facility], [address] [place], [place] [address]    Report Status 02/17/2018 FINAL  Final  Blood Cultures x 2 sites     Status: None   Collection Time: 02/12/18  2:39 AM  Result Value Ref Range Status   Specimen Description   Final    BLOOD RIGHT ANTECUBITAL Performed at [facility], [address] [place], [place] [address]    Special Requests   Final    BOTTLES DRAWN AEROBIC AND ANAEROBIC Blood  Culture adequate volume Performed at [facility], [address] [place], [place] [address]    Culture   Final    NO GROWTH 5 DAYS Performed at [facility], [address] [place], [place] [address]    Report Status 02/17/2018 FINAL  Final  MRSA PCR Screening     Status: None   Collection Time: 02/13/18  7:48 PM  Result Value Ref Range Status   MRSA by PCR NEGATIVE NEGATIVE Final    Comment:  The GeneXpert MRSA Assay (FDA approved for NASAL specimens only), is one component of a comprehensive MRSA colonization surveillance program. It is not intended to diagnose MRSA infection nor to guide or monitor treatment for MRSA infections. Performed at [facility], [address], [place], [place] [address]          Radiology Studies: Dg Chest 2 View  Result Date: 02/19/2018 CLINICAL DATA:  Fever EXAM: CHEST  2 VIEW COMPARISON:  08/27/2013 FINDINGS: Linear atelectasis or scarring in the lower lungs. Tiny pleural effusions. No focal consolidation. Trace fluid along the right pulmonary fissure. Cardiomediastinal silhouette within normal limits. No pneumothorax. IMPRESSION: 1. Small pleural effusions with linear atelectasis or scarring at the bilateral lung bases. 2. No focal pulmonary airspace disease. Electronically Signed   By: Jasmine Pang M.D.   On: 02/19/2018 15:16        Scheduled Meds: . amLODipine  5 mg Oral Daily  . aspirin EC  325 mg Oral Daily  . atorvastatin  20 mg Oral q1800  . clopidogrel  75 mg Oral Q breakfast  . docusate sodium  100 mg Oral BID  . ferrous sulfate  325 mg Oral BID WC  . folic acid  1 mg Oral Daily  . gabapentin  600 mg Oral BID  . insulin aspart  0-9 Units Subcutaneous TID WC  . insulin glargine  10 Units Subcutaneous QHS  . levETIRAcetam  500 mg Oral BID  . nutrition supplement (JUVEN)  1 packet Oral BID BM  . sodium chloride flush  3 mL Intravenous Q12H   Continuous Infusions: . sodium chloride     .  ceFAZolin (ANCEF) IV 1 g (02/20/18 1437)  . lactated ringers    . metronidazole Stopped (02/20/18 1114)     LOS: 8 days    Time spent: 35 minutes,     Alba Cory, MD [facility] Pager [phone]  If 7PM-7AM, please contact night-coverage [email or web address] Password [facility] 02/20/2018, 2:56 PM

## 2018-02-20 NOTE — Progress Notes (Signed)
Occupational Therapy Treatment Patient Details Name: Luke Manning MRN: [id number] DOB: 04/07/1961 Today's Date: 02/20/2018    History of present illness 57yo male referred to the ED by the foot center secondary to R third toe gangrene, cellulitis with severe pain. He received surgery for amputation of his third R toe on 02/17/18. PMH DM, hx MI    OT comments  Pt able to perform toilet transfer today with min guard assist and required supervision for grooming tasks x2 standing at the sink. Pt requires max assist to don R darco shoe but reports wife can assist as needed upon return home. [facility] Will continue to follow acutely.   Follow Up Recommendations  Home health OT;Supervision/Assistance - 24 hour    Equipment Recommendations  3 in 1 bedside commode    Recommendations for Other Services      Precautions / Restrictions Precautions Precautions: Fall Precaution Comments: needs Darco shoe  Restrictions Weight Bearing Restrictions: Yes RLE Weight Bearing: Weight bearing as tolerated Other Position/Activity Restrictions: in Darco shoe        Mobility Bed Mobility Overal bed mobility: Needs Assistance Bed Mobility: Supine to Sit     Supine to sit: Supervision     General bed mobility comments: for safety  Transfers Overall transfer level: Needs assistance Equipment used: Rolling walker (2 wheeled) Transfers: Sit to/from Stand Sit to Stand: Min guard         General transfer comment: for safety, cues for hand placement and technique    Balance Overall balance assessment: Mild deficits observed, not formally tested                                         ADL either performed or assessed with clinical judgement   ADL Overall ADL's : Needs assistance/impaired     Grooming: Supervision/safety;Standing;Wash/dry hands;Oral care               Lower Body Dressing: Maximal assistance Lower Body Dressing Details  (indicate cue type and reason): to don R darco shoe Toilet Transfer: Min guard;Ambulation;Regular Toilet;RW;Grab bars   Toileting- Clothing Manipulation and Hygiene: Min guard;Sit to/from stand       Functional mobility during ADLs: Min guard;Rolling walker General ADL Comments: Discussed use of RW for safety, use of darco shoe when OOB, educated on elevation of RLE for edema and pain control     Vision       Perception     Praxis      Cognition Arousal/Alertness: Awake/alert Behavior During Therapy: WFL for tasks assessed/performed Overall Cognitive Status: Within Functional Limits for tasks assessed                                          Exercises     Shoulder Instructions       General Comments      Pertinent Vitals/ Pain       Pain Assessment: 0-10 Pain Score: 10-Worst pain ever Pain Location: R foot Pain Descriptors / Indicators: Aching;Sharp;Sore Pain Intervention(s): Monitored during session;Limited activity within patient's tolerance;Repositioned;Patient requesting pain meds-RN notified  Home Living  Prior Functioning/Environment              Frequency  Min 2X/week        Progress Toward Goals  OT Goals(current goals can now be found in the care plan section)  Progress towards OT goals: Progressing toward goals  Acute Rehab OT Goals Patient Stated Goal: to reduce pain  OT Goal Formulation: With patient  Plan Discharge plan needs to be updated    Co-evaluation                 AM-PAC PT "6 Clicks" Daily Activity     Outcome Measure   Help from another person eating meals?: None Help from another person taking care of personal grooming?: A Little Help from another person toileting, which includes using toliet, bedpan, or urinal?: A Little Help from another person bathing (including washing, rinsing, drying)?: A Little Help from another person to put on  and taking off regular upper body clothing?: A Little Help from another person to put on and taking off regular lower body clothing?: A Lot 6 Click Score: 18    End of Session Equipment Utilized During Treatment: Other (comment);Rolling walker(R darco shoe)  OT Visit Diagnosis: Other abnormalities of gait and mobility (R26.89);Pain Pain - Right/Left: Right Pain - part of body: Ankle and joints of foot   Activity Tolerance Patient tolerated treatment well   Patient Left in chair;with call bell/phone within reach;with chair alarm set;with nursing/sitter in room   Nurse Communication          Time: 1610-9604 OT Time Calculation (min): 21 min  Charges: OT General Charges $OT Visit: 1 Visit OT Treatments $Self Care/Home Management : 8-22 mins  Termaine Roupp A. Brett Albino, M.S., OTR/L Pager: [phone]   Gaye Alken 02/20/2018, 2:41 PM

## 2018-02-20 NOTE — Progress Notes (Signed)
Physical Therapy Treatment Patient Details Name: Luke Manning MRN: [id number] DOB: 1961/12/01 Today's Date: 02/20/2018    History of Present Illness 56yo male referred to the ED by the foot center secondary to R third toe gangrene, cellulitis with severe pain. He received surgery for amputation of his third R toe on 02/17/18. PMH DM, hx MI     PT Comments    Pt progressing well.  Likely will be mobilizing safely enough to go home with adequate family assist and HHPT.  Still antalgic gait with limited distance.    Follow Up Recommendations  Home health PT;Supervision/Assistance - 24 hour     Equipment Recommendations  Rolling walker with 5" wheels    Recommendations for Other Services       Precautions / Restrictions Precautions Precautions: Fall Precaution Comments: needs Darco shoe  Restrictions Weight Bearing Restrictions: Yes RLE Weight Bearing: Weight bearing as tolerated Other Position/Activity Restrictions: in Darco shoe     Mobility  Bed Mobility Overal bed mobility: Needs Assistance Bed Mobility: Supine to Sit     Supine to sit: Supervision     General bed mobility comments: for safety  Transfers Overall transfer level: Needs assistance Equipment used: Rolling walker (2 wheeled) Transfers: Sit to/from Stand Sit to Stand: Min guard         General transfer comment: for safety, cues for hand placement and technique  Ambulation/Gait Ambulation/Gait assistance: Min guard Ambulation Distance (Feet): 85 Feet Assistive device: Rolling walker (2 wheeled) Gait Pattern/deviations: Step-to pattern;Step-through pattern;Decreased step length - left;Decreased stance time - right;Antalgic   Gait velocity interpretation: Below normal speed for age/gender General Gait Details: mildly antalgic ,moderate use of the RW   Stairs            Wheelchair Mobility    Modified Rankin (Stroke Patients Only)       Balance Overall balance assessment: Mild  deficits observed, not formally tested                                          Cognition Arousal/Alertness: Awake/alert Behavior During Therapy: WFL for tasks assessed/performed Overall Cognitive Status: Within Functional Limits for tasks assessed                                        Exercises      General Comments General comments (skin integrity, edema, etc.): EHR clibing into the upper 120's and 130's.  Pt assymptomatic      Pertinent Vitals/Pain Pain Assessment: Faces Pain Score: 10-Worst pain ever Faces Pain Scale: Hurts even more Pain Location: R foot Pain Descriptors / Indicators: Aching;Sharp;Sore Pain Intervention(s): Monitored during session    Home Living                      Prior Function            PT Goals (current goals can now be found in the care plan section) Acute Rehab PT Goals Patient Stated Goal: to reduce pain  PT Goal Formulation: With patient Time For Goal Achievement: 03/04/18 Potential to Achieve Goals: Good Progress towards PT goals: Progressing toward goals    Frequency    Min 3X/week      PT Plan Current plan remains appropriate    Co-evaluation  AM-PAC PT "6 Clicks" Daily Activity  Outcome Measure  Difficulty turning over in bed (including adjusting bedclothes, sheets and blankets)?: A Little Difficulty moving from lying on back to sitting on the side of the bed? : A Little Difficulty sitting down on and standing up from a chair with arms (e.g., wheelchair, bedside commode, etc,.)?: Unable Help needed moving to and from a bed to chair (including a wheelchair)?: A Little Help needed walking in hospital room?: A Little Help needed climbing 3-5 steps with a railing? : A Little 6 Click Score: 16    End of Session   Activity Tolerance: Patient tolerated treatment well;Patient limited by pain Patient left: in bed;with call bell/phone within reach;with bed alarm  set Nurse Communication: Mobility status;Other (comment) PT Visit Diagnosis: Unsteadiness on feet (R26.81);Other abnormalities of gait and mobility (R26.89) Pain - Right/Left: Right Pain - part of body: (foot)     Time: [id number] PT Time Calculation (min) (ACUTE ONLY): 17 min  Charges:  $Gait Training: 8-22 mins                    G Codes:       02/20/2018  [place] BingKen Shadiyah Wernli, PT [phone]  (pager)   Eliseo GumKenneth V Lashawne Dura 02/20/2018, 5:39 PM

## 2018-02-21 ENCOUNTER — Other Ambulatory Visit: Payer: Self-pay

## 2018-02-21 DIAGNOSIS — I999 Unspecified disorder of circulatory system: Principal | ICD-10-CM

## 2018-02-21 DIAGNOSIS — I96 Gangrene, not elsewhere classified: Secondary | ICD-10-CM

## 2018-02-21 DIAGNOSIS — M79671 Pain in right foot: Principal | ICD-10-CM

## 2018-02-21 DIAGNOSIS — Z48812 Encounter for surgical aftercare following surgery on the circulatory system: Secondary | ICD-10-CM

## 2018-02-21 LAB — HEPATIC FUNCTION PANEL
ALT: 12 U/L — AB (ref 17–63)
AST: 21 U/L (ref 15–41)
Albumin: 3 g/dL — ABNORMAL LOW (ref 3.5–5.0)
Alkaline Phosphatase: 49 U/L (ref 38–126)
BILIRUBIN INDIRECT: 0.6 mg/dL (ref 0.3–0.9)
Bilirubin, Direct: 0.2 mg/dL (ref 0.1–0.5)
TOTAL PROTEIN: 6.7 g/dL (ref 6.5–8.1)
Total Bilirubin: 0.8 mg/dL (ref 0.3–1.2)

## 2018-02-21 LAB — GLUCOSE, CAPILLARY
GLUCOSE-CAPILLARY: 109 mg/dL — AB (ref 65–99)
GLUCOSE-CAPILLARY: 230 mg/dL — AB (ref 65–99)
Glucose-Capillary: 152 mg/dL — ABNORMAL HIGH (ref 65–99)
Glucose-Capillary: 115 mg/dL — ABNORMAL HIGH (ref 65–99)

## 2018-02-21 LAB — CBC WITH DIFFERENTIAL/PLATELET
BASOS PCT: 0 %
Basophils Absolute: 0 10*3/uL (ref 0.0–0.1)
EOS ABS: 0.4 10*3/uL (ref 0.0–0.7)
EOS PCT: 3 %
HEMATOCRIT: 23.8 % — AB (ref 39.0–52.0)
Hemoglobin: 7.5 g/dL — ABNORMAL LOW (ref 13.0–17.0)
LYMPHS ABS: 3 10*3/uL (ref 0.7–4.0)
Lymphocytes Relative: 25 %
MCH: 25.3 pg — ABNORMAL LOW (ref 26.0–34.0)
MCHC: 31.5 g/dL (ref 30.0–36.0)
MCV: 80.1 fL (ref 78.0–100.0)
MONO ABS: 0.8 10*3/uL (ref 0.1–1.0)
Monocytes Relative: 7 %
Neutro Abs: 7.8 10*3/uL — ABNORMAL HIGH (ref 1.7–7.7)
Neutrophils Relative %: 65 %
Platelets: 386 10*3/uL (ref 150–400)
RBC: 2.97 MIL/uL — AB (ref 4.22–5.81)
RDW: 15.4 % (ref 11.5–15.5)
WBC: 12 10*3/uL — AB (ref 4.0–10.5)

## 2018-02-21 LAB — PREPARE RBC (CROSSMATCH)

## 2018-02-21 LAB — VITAMIN B12: Vitamin B-12: 357 pg/mL (ref 180–914)

## 2018-02-21 MED ORDER — POLYETHYLENE GLYCOL 3350 17 G PO PACK: 17.0000 g | PACK | Freq: Every day | ORAL | Status: DC

## 2018-02-21 MED ORDER — SODIUM CHLORIDE 0.9 % IV SOLN: Freq: Once | INTRAVENOUS | Status: DC

## 2018-02-21 MED ORDER — ENSURE ENLIVE PO LIQD
237.0000 mL | Freq: Two times a day (BID) | ORAL | Status: DC
  Administered 2018-02-21: 237 mL via ORAL

## 2018-02-21 NOTE — Progress Notes (Signed)
PROGRESS NOTE    Marky Buresh  ZOX:[id number] DOB: 02-Feb-1961 DOA: 02/11/2018 PCP: Jackie Plum, MD    Brief Narrative: 57 year old male with past medical history of diabetes mellitus type 2 on oral medications presented with complains of pain in the right third toe, ongoing for the past 1 month. Pt went to see his podiatrist who recommended that he come into the hospital for further evaluation. In the ED, toe noted to be gangrenous. Pt admitted for further management.    Assessment & Plan:   Principal Problem:   Gangrene of toe of right foot (HCC) Active Problems:   DM2 (diabetes mellitus, type 2) (HCC)   HTN (hypertension)   Seizure disorder (HCC)  1-Right foot cellulitis and gangrenous 3 rd toe;  X ray foot; no concern for osteomyelitis.  Blood culture negative.  On ancef and flagyl.  WBC continue to decreased. Continue with IV antibiotics.   Fever, leukocytosis; afebrile. WBC trending down.  Evaluated by vascular,. No worsening foot infection/  Afebrile.  Chest x ray and ua negative.  On IV antibiotics.   Hypokalemia; normalized.  Mg normal at 1.9.  Resolved.   PVD; ABI abnormal, R ABI 0.46, Had angiogram with successful balloon angioplasty to the R superficial femoral artery on 02/14/18 Continue with plavix.   Anemia;  Iron panel: Fe 64, TIBC 200, sats 32, ferritin 549, AOCD S/P Transfused 1U of PRBC on 02/16/18 Hb decreased to 7.5. No obvious source of bleeding. Check bili, occult blood ordered.  Will transfuse 2 units PRBC>   HTN; continue with Norvasc.    History of seizure; continue with keppra.        DVT prophylaxis: lovenox Code Status: full code.  Family Communication: care discussed with patient  Disposition Plan: home 3-01 of WBC decreased further.   Consultants:   Vascular    Procedures:     Antimicrobials:  Ancef.    Subjective: He is alert, wants to go home, denies melena, hematochezia.   Objective: Vitals:   02/21/18 1327 02/21/18 1338 02/21/18 1341 02/21/18 1356  BP: 123/79 132/85 132/85 122/80  Pulse: (!) 103     Resp:   19 (!) 21  Temp: 98.9 F (37.2 C)  99.5 F (37.5 C) 98.8 F (37.1 C)  TempSrc: Oral  Oral Oral  SpO2:   100% 99%  Weight:      Height:        Intake/Output Summary (Last 24 hours) at 02/21/2018 1524 Last data filed at 02/21/2018 1341 Gross per 24 hour  Intake 510 ml  Output 300 ml  Net 210 ml   Filed Weights   02/19/18 0508 02/20/18 0521 02/21/18 0508  Weight: 56.1 kg (123 lb 10.9 oz) 52.1 kg (114 lb 12.8 oz) 53.2 kg (117 lb 4.8 oz)    Examination:  General exam: NAD Respiratory system: CTA Cardiovascular system: S 1, S 2 RRR Gastrointestinal system: BS present, soft , nt Central nervous system: non focal.  Extremities: Right foot with clean dressing.  Skin: No rash.  Psychiatry: Mood and affect appropriate.     Data Reviewed: I have personally reviewed following labs and imaging studies  CBC: Recent Labs  Lab 02/17/18 0404 02/18/18 0330 02/19/18 0326 02/20/18 0410 02/20/18 1402 02/21/18 0336  WBC 10.3 10.5 14.8* 13.0*  --  12.0*  NEUTROABS 7.3 7.9* 12.5* 9.7*  --  7.8*  HGB 8.6* 8.8* 9.0* 7.9* 9.1* 7.5*  HCT 27.1* 27.7* 27.7* 24.7* 28.6* 23.8*  MCV 79.2 79.1 80.3 79.9  --  80.1  PLT 269 308 364 341  --  386   Basic Metabolic Panel: Recent Labs  Lab 02/16/18 0617 02/17/18 0404 02/18/18 0330 02/19/18 0326 02/20/18 0410  NA 137 136 136 136 138  K 3.3* 3.1* 3.2* 2.9* 3.5  CL 105 104 105 102 106  CO2 21* 23 22 25 23   GLUCOSE 87 84 114* 109* 97  BUN 18 12 11 17 17   CREATININE 1.19 1.17 1.12 1.15 1.14  CALCIUM 8.5* 8.5* 8.5* 8.7* 8.6*  MG  --   --   --   --  1.9   GFR: Estimated Creatinine Clearance: 53.1 mL/min (by C-G formula based on SCr of 1.14 mg/dL). Liver Function Tests: Recent Labs  Lab 02/21/18 1057  AST 21  ALT 12*  ALKPHOS 49  BILITOT 0.8  PROT 6.7  ALBUMIN 3.0*   No results for input(s): LIPASE, AMYLASE in the  last 168 hours. No results for input(s): AMMONIA in the last 168 hours. Coagulation Profile: No results for input(s): INR, PROTIME in the last 168 hours. Cardiac Enzymes: No results for input(s): CKTOTAL, CKMB, CKMBINDEX, TROPONINI in the last 168 hours. BNP (last 3 results) No results for input(s): PROBNP in the last 8760 hours. HbA1C: No results for input(s): HGBA1C in the last 72 hours. CBG: Recent Labs  Lab 02/20/18 1156 02/20/18 1822 02/20/18 2118 02/21/18 0741 02/21/18 1133  GLUCAP 139* 180* 155* 109* 152*   Lipid Profile: No results for input(s): CHOL, HDL, LDLCALC, TRIG, CHOLHDL, LDLDIRECT in the last 72 hours. Thyroid Function Tests: No results for input(s): TSH, T4TOTAL, FREET4, T3FREE, THYROIDAB in the last 72 hours. Anemia Panel: Recent Labs    02/21/18 0336  VITAMINB12 357   Sepsis Labs: No results for input(s): PROCALCITON, LATICACIDVEN in the last 168 hours.  Recent Results (from the past 240 hour(s))  Blood Cultures x 2 sites     Status: None   Collection Time: 02/12/18  2:39 AM  Result Value Ref Range Status   Specimen Description   Final    BLOOD LEFT ARM Performed at [facility], [address] [place], [place] [address]    Special Requests   Final    IN PEDIATRIC BOTTLE Blood Culture adequate volume Performed at [facility], [address] [place], [place] [address]    Culture   Final    NO GROWTH 5 DAYS Performed at [facility], [address] [place], [place] [address]    Report Status 02/17/2018 FINAL  Final  Blood Cultures x 2 sites     Status: None   Collection Time: 02/12/18  2:39 AM  Result Value Ref Range Status   Specimen Description   Final    BLOOD RIGHT ANTECUBITAL Performed at [facility], [address] [place], [place] [address]    Special Requests   Final    BOTTLES DRAWN AEROBIC AND ANAEROBIC Blood Culture adequate volume Performed at [facility], [address] [place], [place] [address]    Culture   Final    NO GROWTH 5 DAYS Performed at [facility], [address] [place], [place] [address]    Report Status 02/17/2018 FINAL  Final  MRSA PCR Screening     Status: None   Collection Time: 02/13/18  7:48 PM  Result Value Ref Range Status   MRSA by PCR NEGATIVE NEGATIVE Final    Comment:        The  GeneXpert MRSA Assay (FDA approved for NASAL specimens only), is one component of a comprehensive MRSA colonization surveillance program. It is not intended to diagnose MRSA infection nor to guide or monitor treatment for MRSA infections. Performed at [facility], [address], [place], [place] [address]          Radiology Studies: No results found.      Scheduled Meds: . amLODipine  5 mg Oral Daily  . aspirin EC  325 mg Oral Daily  . atorvastatin  20 mg Oral q1800  . clopidogrel  75 mg Oral Q breakfast  . docusate sodium  100 mg Oral BID  . feeding supplement (ENSURE ENLIVE)  237 mL Oral BID BM  . ferrous sulfate  325 mg Oral BID WC  . folic acid  1 mg Oral Daily  . gabapentin  600 mg Oral BID  . insulin aspart  0-9 Units Subcutaneous TID WC  . insulin glargine  10 Units Subcutaneous QHS  . levETIRAcetam  500 mg Oral BID  . nutrition supplement (JUVEN)  1 packet Oral BID BM  . polyethylene glycol  17 g Oral Daily  . sodium chloride flush  3 mL Intravenous Q12H   Continuous Infusions: . sodium chloride    . sodium chloride    .  ceFAZolin (ANCEF) IV Stopped (02/21/18 1352)  . lactated ringers    . metronidazole Stopped (02/21/18 1030)     LOS: 9 days    Time spent: 35 minutes,     Alba Cory, MD [facility] Pager [phone]  If 7PM-7AM, please contact night-coverage [email or web address] Password [facility] 02/21/2018, 3:24 PM

## 2018-02-21 NOTE — Progress Notes (Signed)
Physical Therapy Treatment Patient Details Name: Luke Manning MRN: [id number] DOB: 1961/10/07 Today's Date: 02/21/2018    History of Present Illness 56yo male referred to the ED by the foot center secondary to R third toe gangrene, cellulitis with severe pain. He received surgery for amputation of his third R toe on 02/17/18. PMH DM, hx MI     PT Comments    Progressing steadily.  More fluid gait, improved steadiness, though still need RW.   Follow Up Recommendations  Home health PT;Supervision/Assistance - 24 hour     Equipment Recommendations  Rolling walker with 5" wheels;3in1 (PT)    Recommendations for Other Services       Precautions / Restrictions Precautions Precautions: Fall Precaution Comments: needs Darco shoe  Restrictions RLE Weight Bearing: Weight bearing as tolerated Other Position/Activity Restrictions: in Darco shoe     Mobility  Bed Mobility Overal bed mobility: Modified Independent                Transfers Overall transfer level: Needs assistance Equipment used: Rolling walker (2 wheeled) Transfers: Sit to/from Stand Sit to Stand: Supervision         General transfer comment: cues for hand placement  Ambulation/Gait Ambulation/Gait assistance: Supervision Ambulation Distance (Feet): 80 Feet Assistive device: Rolling walker (2 wheeled) Gait Pattern/deviations: Step-to pattern;Step-through pattern   Gait velocity interpretation: Below normal speed for age/gender General Gait Details: generally steady now, less antalgic, lighter use of the RW.   Stairs            Wheelchair Mobility    Modified Rankin (Stroke Patients Only)       Balance Overall balance assessment: Mild deficits observed, not formally tested                                          Cognition Arousal/Alertness: Awake/alert Behavior During Therapy: WFL for tasks assessed/performed Overall Cognitive Status: Within Functional Limits for tasks  assessed                                        Exercises      General Comments General comments (skin integrity, edema, etc.): pt had some difficulty donning the [facility].  Worked on best method for him.      Pertinent Vitals/Pain Pain Assessment: 0-10 Pain Score: 5  Pain Location: R foot Pain Descriptors / Indicators: Aching;Sore Pain Intervention(s): Monitored during session    Home Living                      Prior Function            PT Goals (current goals can now be found in the care plan section) Acute Rehab PT Goals Patient Stated Goal: to reduce pain  PT Goal Formulation: With patient Time For Goal Achievement: 03/04/18 Potential to Achieve Goals: Good Progress towards PT goals: Progressing toward goals    Frequency    Min 3X/week      PT Plan Current plan remains appropriate    Co-evaluation              AM-PAC PT "6 Clicks" Daily Activity  Outcome Measure  Difficulty turning over in bed (including adjusting bedclothes, sheets and blankets)?: None Difficulty moving from lying on back to sitting on the  side of the bed? : None Difficulty sitting down on and standing up from a chair with arms (e.g., wheelchair, bedside commode, etc,.)?: A Little Help needed moving to and from a bed to chair (including a wheelchair)?: A Little Help needed walking in hospital room?: A Little Help needed climbing 3-5 steps with a railing? : A Little 6 Click Score: 20    End of Session   Activity Tolerance: Patient tolerated treatment well Patient left: in bed;with call bell/phone within reach;with bed alarm set Nurse Communication: Mobility status;Other (comment) PT Visit Diagnosis: Other abnormalities of gait and mobility (R26.89);Unsteadiness on feet (R26.81) Pain - Right/Left: Right Pain - part of body: (foot)     Time: [id number] PT Time Calculation (min) (ACUTE ONLY): 17 min  Charges:  $Gait Training: 8-22 mins                     G Codes:       02/21/2018  [place] Luke Manning, PT [phone]  (pager)   Luke Manning 02/21/2018, 6:08 PM

## 2018-02-22 LAB — CBC WITH DIFFERENTIAL/PLATELET
BASOS ABS: 0.1 10*3/uL (ref 0.0–0.1)
Basophils Relative: 1 %
EOS ABS: 0.5 10*3/uL (ref 0.0–0.7)
EOS PCT: 4 %
HCT: 33.7 % — ABNORMAL LOW (ref 39.0–52.0)
Hemoglobin: 11.3 g/dL — ABNORMAL LOW (ref 13.0–17.0)
LYMPHS ABS: 2.5 10*3/uL (ref 0.7–4.0)
Lymphocytes Relative: 22 %
MCH: 26.8 pg (ref 26.0–34.0)
MCHC: 33.5 g/dL (ref 30.0–36.0)
MCV: 80 fL (ref 78.0–100.0)
MONO ABS: 0.8 10*3/uL (ref 0.1–1.0)
Monocytes Relative: 7 %
Neutro Abs: 7.5 10*3/uL (ref 1.7–7.7)
Neutrophils Relative %: 66 %
PLATELETS: 385 10*3/uL (ref 150–400)
RBC: 4.21 MIL/uL — AB (ref 4.22–5.81)
RDW: 14.8 % (ref 11.5–15.5)
WBC: 11.4 10*3/uL — AB (ref 4.0–10.5)

## 2018-02-22 LAB — TYPE AND SCREEN
ABO/RH(D): A POS
ANTIBODY SCREEN: NEGATIVE
UNIT DIVISION: 0
Unit division: 0

## 2018-02-22 LAB — BPAM RBC
BLOOD PRODUCT EXPIRATION DATE: 201903182359
Blood Product Expiration Date: 201903182359
ISSUE DATE / TIME: 201903011759
ISSUE DATE / TIME: 201903011323
UNIT TYPE AND RH: 6200
Unit Type and Rh: 6200

## 2018-02-22 LAB — GLUCOSE, CAPILLARY
GLUCOSE-CAPILLARY: 84 mg/dL (ref 65–99)
GLUCOSE-CAPILLARY: 103 mg/dL — AB (ref 65–99)

## 2018-02-22 MED ORDER — DOCUSATE SODIUM 100 MG PO CAPS: 100.0000 mg | ORAL_CAPSULE | Freq: Two times a day (BID) | ORAL | 0 refills | Status: AC

## 2018-02-22 MED ORDER — OXYCODONE HCL 5 MG PO TABS: 5.0000 mg | ORAL_TABLET | ORAL | 0 refills | Status: DC | PRN

## 2018-02-22 MED ORDER — CEPHALEXIN 500 MG PO CAPS: 500.0000 mg | ORAL_CAPSULE | Freq: Four times a day (QID) | ORAL | 0 refills | Status: DC

## 2018-02-22 MED ORDER — ASPIRIN 325 MG PO TBEC: 325.0000 mg | DELAYED_RELEASE_TABLET | Freq: Every day | ORAL | 0 refills | Status: DC

## 2018-02-22 MED ORDER — CLOPIDOGREL BISULFATE 75 MG PO TABS: 75.0000 mg | ORAL_TABLET | Freq: Every day | ORAL | 0 refills | Status: AC

## 2018-02-22 MED ORDER — ASPIRIN 81 MG PO TBEC: 81.0000 mg | DELAYED_RELEASE_TABLET | Freq: Every day | ORAL | 0 refills | Status: AC

## 2018-02-22 MED ORDER — ATORVASTATIN CALCIUM 20 MG PO TABS: 20.0000 mg | ORAL_TABLET | Freq: Every day | ORAL | 0 refills | Status: AC

## 2018-02-22 MED ORDER — ATORVASTATIN CALCIUM 20 MG PO TABS: 20.0000 mg | ORAL_TABLET | Freq: Every day | ORAL | 0 refills | Status: DC

## 2018-02-22 MED ORDER — FERROUS SULFATE 325 (65 FE) MG PO TABS: 325.0000 mg | ORAL_TABLET | Freq: Two times a day (BID) | ORAL | 1 refills | Status: AC

## 2018-02-22 MED ORDER — CEPHALEXIN 500 MG PO CAPS
500.0000 mg | ORAL_CAPSULE | Freq: Four times a day (QID) | ORAL | Status: DC
  Administered 2018-02-22: 500 mg via ORAL
  Filled 2018-02-22: qty 1

## 2018-02-22 MED ORDER — ASPIRIN EC 81 MG PO TBEC
81.0000 mg | DELAYED_RELEASE_TABLET | Freq: Every day | ORAL | Status: DC
  Administered 2018-02-22: 81 mg via ORAL
  Filled 2018-02-22: qty 1

## 2018-02-22 MED ORDER — DOCUSATE SODIUM 100 MG PO CAPS: 100.0000 mg | ORAL_CAPSULE | Freq: Two times a day (BID) | ORAL | 0 refills | Status: DC

## 2018-02-22 MED ORDER — CEFADROXIL 500 MG PO CAPS: 500.0000 mg | ORAL_CAPSULE | Freq: Two times a day (BID) | ORAL | Status: DC

## 2018-02-22 MED ORDER — ENSURE ENLIVE PO LIQD: 237.0000 mL | Freq: Two times a day (BID) | ORAL | 12 refills | Status: DC

## 2018-02-22 MED ORDER — "SYRINGE 20G X 1"" 6 ML MISC": 1.0000 "application " | Freq: Every day | 1 refills | Status: DC

## 2018-02-22 MED ORDER — CLOPIDOGREL BISULFATE 75 MG PO TABS: 75.0000 mg | ORAL_TABLET | Freq: Every day | ORAL | 0 refills | Status: DC

## 2018-02-22 MED ORDER — FERROUS SULFATE 325 (65 FE) MG PO TABS: 325.0000 mg | ORAL_TABLET | Freq: Two times a day (BID) | ORAL | 1 refills | Status: DC

## 2018-02-22 MED ORDER — CEFADROXIL 500 MG PO CAPS: 500.0000 mg | ORAL_CAPSULE | Freq: Two times a day (BID) | ORAL | 0 refills | Status: DC

## 2018-02-22 MED ORDER — ASPIRIN 81 MG PO TBEC: 81.0000 mg | DELAYED_RELEASE_TABLET | Freq: Every day | ORAL | 0 refills | Status: DC

## 2018-02-22 MED ORDER — INSULIN GLARGINE 100 UNIT/ML ~~LOC~~ SOLN: 10.0000 [IU] | Freq: Every day | SUBCUTANEOUS | 11 refills | Status: DC

## 2018-02-22 MED ORDER — METRONIDAZOLE 500 MG PO TABS
500.0000 mg | ORAL_TABLET | Freq: Three times a day (TID) | ORAL | Status: DC
  Administered 2018-02-22: 500 mg via ORAL
  Filled 2018-02-22: qty 1

## 2018-02-22 MED ORDER — METRONIDAZOLE 500 MG PO TABS: 500.0000 mg | ORAL_TABLET | Freq: Three times a day (TID) | ORAL | 0 refills | Status: DC

## 2018-02-22 MED ORDER — CEPHALEXIN 500 MG PO CAPS: 500.0000 mg | ORAL_CAPSULE | Freq: Two times a day (BID) | ORAL | Status: DC

## 2018-02-22 NOTE — Discharge Summary (Signed)
Physician Discharge Summary  Luke Manning ZOX:[id number] DOB: 1961-05-04 DOA: 02/11/2018  PCP: Jackie Plum, MD  Admit date: 02/11/2018 Discharge date: 02/22/2018  Admitted From: Home  Disposition:  Home   Recommendations for Outpatient Follow-up:  1. Follow up with PCP in 1-2 weeks 2. Please obtain BMP/CBC in one week 3. Follow up with vascular and ortho post op.  4. Adjust DM medications as needed.   Home Health; Yes.   Discharge Condition: Stable.  CODE STATUS: Full code.  Diet recommendation: Heart Healthy  Brief/Interim Summary:  Brief Narrative: 57 year old male with past medical history of diabetes mellitus type 2 on oral medications presented with complains of pain in the right third toe, ongoing for the past 1 month. Pt went to see his podiatrist who recommended that he come into the hospital for further evaluation. In the ED, toe noted to be gangrenous. Pt admitted for further management.    Assessment & Plan:   Principal Problem:   Gangrene of toe of right foot (HCC) Active Problems:   DM2 (diabetes mellitus, type 2) (HCC)   HTN (hypertension)   Seizure disorder (HCC)  1-Right foot cellulitis and gangrenous 3 rd toe;  X ray foot; no concern for osteomyelitis.  Blood culture negative.  On ancef and flagyl.  WBC decreased. Plan to discharge on keflex and flagyl for 7 days.   Fever, leukocytosis; afebrile. improved.  Evaluated by vascular,. No worsening foot infection/  Afebrile.  Chest x ray and ua negative.  On IV antibiotics.   Hypokalemia; normalized.  Mg normal at 1.9.  Resolved.   PVD; ABI abnormal, R ABI 0.46, Had angiogram with successful balloon angioplasty to the R superficial femoral artery on 02/14/18 Continue with plavix. Prescription provided. Was also started on aspirin, decreased to 81 mg.   Anemia;  Iron panel: Fe 64, TIBC 200, sats 32, ferritin 549, AOCD Hb decreased to 7.5. No obvious source of bleeding. Bili normal.  S/P  total 3  unit PRBC during Hospitalization. Needs further evaluation out patient.   HTN; continue with Norvasc.   DM; started on lantus. Resume metformin. Needs good BS controlled to help with healing.   History of seizure; continue with keppra.        Discharge Diagnoses:  Principal Problem:   Gangrene of toe of right foot (HCC) Active Problems:   DM2 (diabetes mellitus, type 2) (HCC)   HTN (hypertension)   Seizure disorder [facility])    Discharge Instructions  Discharge Instructions    Diet - low sodium heart healthy   Complete by:  As directed    Diet - low sodium heart healthy   Complete by:  As directed    Increase activity slowly   Complete by:  As directed    Increase activity slowly   Complete by:  As directed      Allergies as of 02/22/2018   No Known Allergies     Medication List    TAKE these medications   amLODipine 5 MG tablet Commonly known as:  NORVASC Take 5 mg by mouth daily.   aspirin 81 MG EC tablet Take 1 tablet (81 mg total) by mouth daily.   atorvastatin 20 MG tablet Commonly known as:  LIPITOR Take 1 tablet (20 mg total) by mouth daily at 6 PM.   cephALEXin 500 MG capsule Commonly known as:  KEFLEX Take 1 capsule (500 mg total) by mouth every 6 (six) hours.   clopidogrel 75 MG tablet Commonly known as:  PLAVIX Take  1 tablet (75 mg total) by mouth daily with breakfast.   docusate sodium 100 MG capsule Commonly known as:  COLACE Take 1 capsule (100 mg total) by mouth 2 (two) times daily.   feeding supplement (ENSURE ENLIVE) Liqd Take 237 mLs by mouth 2 (two) times daily between meals.   ferrous sulfate 325 (65 FE) MG tablet Take 1 tablet (325 mg total) by mouth 2 (two) times daily with a meal.   folic acid 1 MG tablet Commonly known as:  FOLVITE Take 1 mg by mouth daily.   gabapentin 600 MG tablet Commonly known as:  NEURONTIN Take 600 mg by mouth 2 (two) times daily.   insulin glargine 100 UNIT/ML injection Commonly  known as:  LANTUS Inject 0.1 mLs (10 Units total) into the skin at bedtime.   levETIRAcetam 500 MG tablet Commonly known as:  KEPPRA Take 500 mg by mouth 2 (two) times daily.   metFORMIN 500 MG tablet Commonly known as:  GLUCOPHAGE Take 250 mg by mouth daily with breakfast.   metroNIDAZOLE 500 MG tablet Commonly known as:  FLAGYL Take 1 tablet (500 mg total) by mouth every 8 (eight) hours.   oxyCODONE 5 MG immediate release tablet Commonly known as:  Oxy IR/ROXICODONE Take 1-2 tablets (5-10 mg total) by mouth every 4 (four) hours as needed for moderate pain.   SYRINGE 6CC/20GX1" 20G X 1" 6 ML Misc 1 application by Does not apply route daily.   tiZANidine 4 MG tablet Commonly known as:  ZANAFLEX TAKE 2 TABLETS BY MOUTH 3 TIMES A DAY AS NEEDED            Durable Medical Equipment  (From admission, onward)        Start     Ordered   02/21/18 1141  For home use only DME 3 n 1  Once     02/21/18 1140   02/21/18 1140  For home use only DME Walker rolling  Once    Question:  Patient needs a walker to treat with the following condition  Answer:  Weakness   02/21/18 1140     Follow-up Information    Eldred Manges, MD Follow up in 1 week(s).   Specialty:  Orthopedic Surgery Contact information: [address] [place] [address] [phone]        [facility] - Dme Follow up.   Why:  Metallurgist information: [address] [place] [address] [phone]        Jobe Gibbon, [facility] Follow up.   Specialty:  Home Health Services Why:  Registered Nurse, Physical / Occupational Therapy Contact information: [address] [place] [address] [phone]          No Known Allergies  Consultations:  Ortho  Vascular.    Procedures/Studies: Dg Chest 2 View  Result Date: 02/19/2018 CLINICAL DATA:  Fever EXAM: CHEST  2 VIEW COMPARISON:  08/27/2013 FINDINGS: Linear  atelectasis or scarring in the lower lungs. Tiny pleural effusions. No focal consolidation. Trace fluid along the right pulmonary fissure. Cardiomediastinal silhouette within normal limits. No pneumothorax. IMPRESSION: 1. Small pleural effusions with linear atelectasis or scarring at the bilateral lung bases. 2. No focal pulmonary airspace disease. Electronically Signed   By: Jasmine Pang M.D.   On: 02/19/2018 15:16   Dg Foot 2 Views Right  Result Date: 02/11/2018 CLINICAL DATA:  Gangrene of the third toe. EXAM: RIGHT FOOT - 2  VIEW COMPARISON:  February 10, 2018 FINDINGS: There is no evidence of fracture or dislocation. There is no evidence of osteomyelitis. IMPRESSION: No evidence of osteomyelitis. Electronically Signed   By: Sherian ReinWei-Chen  Lin M.D.   On: 02/11/2018 16:56      Subjective: Feeling better.  ready to go home   Discharge Exam: Vitals:   02/22/18 0437 02/22/18 0934  BP: 140/88 (!) 147/84  Pulse: 95   Resp: (!) 22   Temp: 98.1 F (36.7 C)   SpO2: 96%    Vitals:   02/21/18 1950 02/21/18 2045 02/22/18 0437 02/22/18 0934  BP: 112/80 133/88 140/88 (!) 147/84  Pulse: 93 87 95   Resp: (!) 25 19 (!) 22   Temp: 98.7 F (37.1 C) 99.5 F (37.5 C) 98.1 F (36.7 C)   TempSrc: Oral Axillary Oral   SpO2: 100% 100% 96%   Weight:   52.5 kg (115 lb 11.2 oz)   Height:        General: Pt is alert, awake, not in acute distress Cardiovascular: RRR, S1/S2 +, no rubs, no gallops Respiratory: CTA bilaterally, no wheezing, no rhonchi Abdominal: Soft, NT, ND, bowel sounds + Extremities: no edema, no cyanosis, right foot with dressing.     The results of significant diagnostics from this hospitalization (including imaging, microbiology, ancillary and laboratory) are listed below for reference.     Microbiology: Recent Results (from the past 240 hour(s))  MRSA PCR Screening     Status: None   Collection Time: 02/13/18  7:48 PM  Result Value Ref Range Status   MRSA by PCR NEGATIVE  NEGATIVE Final    Comment:        The GeneXpert MRSA Assay (FDA approved for NASAL specimens only), is one component of a comprehensive MRSA colonization surveillance program. It is not intended to diagnose MRSA infection nor to guide or monitor treatment for MRSA infections. Performed at [facility], [address] [place], [place] [address]      Labs: BNP (last 3 results) No results for input(s): BNP in the last 8760 hours. Basic Metabolic Panel: Recent Labs  Lab 02/16/18 0617 02/17/18 0404 02/18/18 0330 02/19/18 0326 02/20/18 0410  NA 137 136 136 136 138  K 3.3* 3.1* 3.2* 2.9* 3.5  CL 105 104 105 102 106  CO2 21* 23 22 25 23   GLUCOSE 87 84 114* 109* 97  BUN 18 12 11 17 17   CREATININE 1.19 1.17 1.12 1.15 1.14  CALCIUM 8.5* 8.5* 8.5* 8.7* 8.6*  MG  --   --   --   --  1.9   Liver Function Tests: Recent Labs  Lab 02/21/18 1057  AST 21  ALT 12*  ALKPHOS 49  BILITOT 0.8  PROT 6.7  ALBUMIN 3.0*   No results for input(s): LIPASE, AMYLASE in the last 168 hours. No results for input(s): AMMONIA in the last 168 hours. CBC: Recent Labs  Lab 02/18/18 0330 02/19/18 0326 02/20/18 0410 02/20/18 1402 02/21/18 0336 02/22/18 0234  WBC 10.5 14.8* 13.0*  --  12.0* 11.4*  NEUTROABS 7.9* 12.5* 9.7*  --  7.8* 7.5  HGB 8.8* 9.0* 7.9* 9.1* 7.5* 11.3*  HCT 27.7* 27.7* 24.7* 28.6* 23.8* 33.7*  MCV 79.1 80.3 79.9  --  80.1 80.0  PLT 308 364 341  --  386 385   Cardiac Enzymes: No results for input(s): CKTOTAL, CKMB, CKMBINDEX, TROPONINI in the last 168 hours. BNP: Invalid input(s): POCBNP CBG: Recent Labs  Lab 02/21/18 1133  02/21/18 1624 02/21/18 1957 02/22/18 0745 02/22/18 1200  GLUCAP 152* 230* 115* 84 103*   D-Dimer No results for input(s): DDIMER in the last 72 hours. Hgb A1c No results for input(s): HGBA1C in the last 72 hours. Lipid Profile No results for input(s): CHOL, HDL, LDLCALC, TRIG, CHOLHDL, LDLDIRECT in the last 72 hours. Thyroid  function studies No results for input(s): TSH, T4TOTAL, T3FREE, THYROIDAB in the last 72 hours.  Invalid input(s): FREET3 Anemia work up Recent Labs    02/21/18 0336  VITAMINB12 357   Urinalysis    Component Value Date/Time   COLORURINE YELLOW 02/19/2018 1513   APPEARANCEUR CLEAR 02/19/2018 1513   LABSPEC 1.020 02/19/2018 1513   PHURINE 5.0 02/19/2018 1513   GLUCOSEU 150 (A) 02/19/2018 1513   HGBUR NEGATIVE 02/19/2018 1513   BILIRUBINUR NEGATIVE 02/19/2018 1513   KETONESUR 5 (A) 02/19/2018 1513   PROTEINUR NEGATIVE 02/19/2018 1513   NITRITE NEGATIVE 02/19/2018 1513   LEUKOCYTESUR TRACE (A) 02/19/2018 1513   Sepsis Labs Invalid input(s): PROCALCITONIN,  WBC,  LACTICIDVEN Microbiology Recent Results (from the past 240 hour(s))  MRSA PCR Screening     Status: None   Collection Time: 02/13/18  7:48 PM  Result Value Ref Range Status   MRSA by PCR NEGATIVE NEGATIVE Final    Comment:        The GeneXpert MRSA Assay (FDA approved for NASAL specimens only), is one component of a comprehensive MRSA colonization surveillance program. It is not intended to diagnose MRSA infection nor to guide or monitor treatment for MRSA infections. Performed at [facility], [address] [place], [place] [address]      Time coordinating discharge: Over 30 minutes  SIGNED:   Alba Cory, MD  [facility] 02/22/2018, 12:48 PM Pager   If 7PM-7AM, please contact night-coverage [email or web address] Password [email or web address]

## 2018-02-22 NOTE — Progress Notes (Signed)
Pt is beeing discharged on lantus QHS. Insuline administration education provided to pt and wife.  Discharge instructions given to patient and wife. All questions answered.

## 2018-02-23 DIAGNOSIS — Z9181 History of falling: Secondary | ICD-10-CM | POA: Diagnosis not present

## 2018-02-23 DIAGNOSIS — Z89421 Acquired absence of other right toe(s): Secondary | ICD-10-CM | POA: Diagnosis not present

## 2018-02-23 DIAGNOSIS — Z792 Long term (current) use of antibiotics: Secondary | ICD-10-CM | POA: Diagnosis not present

## 2018-02-23 DIAGNOSIS — E1151 Type 2 diabetes mellitus with diabetic peripheral angiopathy without gangrene: Secondary | ICD-10-CM | POA: Diagnosis not present

## 2018-02-23 DIAGNOSIS — Z7982 Long term (current) use of aspirin: Secondary | ICD-10-CM | POA: Diagnosis not present

## 2018-02-23 DIAGNOSIS — Z4781 Encounter for orthopedic aftercare following surgical amputation: Secondary | ICD-10-CM | POA: Diagnosis not present

## 2018-02-23 DIAGNOSIS — Z794 Long term (current) use of insulin: Secondary | ICD-10-CM | POA: Diagnosis not present

## 2018-02-23 DIAGNOSIS — Z79891 Long term (current) use of opiate analgesic: Secondary | ICD-10-CM | POA: Diagnosis not present

## 2018-02-23 DIAGNOSIS — L03115 Cellulitis of right lower limb: Secondary | ICD-10-CM | POA: Diagnosis not present

## 2018-02-23 DIAGNOSIS — Z87891 Personal history of nicotine dependence: Secondary | ICD-10-CM | POA: Diagnosis not present

## 2018-02-23 DIAGNOSIS — G40909 Epilepsy, unspecified, not intractable, without status epilepticus: Secondary | ICD-10-CM | POA: Diagnosis not present

## 2018-02-23 DIAGNOSIS — I252 Old myocardial infarction: Secondary | ICD-10-CM | POA: Diagnosis not present

## 2018-02-23 DIAGNOSIS — I10 Essential (primary) hypertension: Secondary | ICD-10-CM | POA: Diagnosis not present

## 2018-02-23 DIAGNOSIS — Z7902 Long term (current) use of antithrombotics/antiplatelets: Secondary | ICD-10-CM | POA: Diagnosis not present

## 2018-02-23 DIAGNOSIS — D649 Anemia, unspecified: Secondary | ICD-10-CM | POA: Diagnosis not present

## 2018-03-03 ENCOUNTER — Encounter: Payer: Self-pay | Admitting: Podiatry

## 2018-03-03 ENCOUNTER — Ambulatory Visit (INDEPENDENT_AMBULATORY_CARE_PROVIDER_SITE_OTHER): Payer: PPO | Admitting: Podiatry

## 2018-03-03 DIAGNOSIS — I999 Unspecified disorder of circulatory system: Secondary | ICD-10-CM

## 2018-03-03 DIAGNOSIS — I96 Gangrene, not elsewhere classified: Secondary | ICD-10-CM | POA: Diagnosis not present

## 2018-03-03 DIAGNOSIS — M79671 Pain in right foot: Secondary | ICD-10-CM

## 2018-03-03 MED ORDER — OXYCODONE-ACETAMINOPHEN 10-325 MG PO TABS: 1.0000 | ORAL_TABLET | Freq: Two times a day (BID) | ORAL | 0 refills | Status: DC | PRN

## 2018-03-03 NOTE — Progress Notes (Signed)
Subjective: 57 y.o. year old male patient presents accompanied by his wife for follow up on right foot lesion. 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 Having difficulty sleeping at night due to pain in the foot.  HPI: Patient was [facility] for Ischemic foot pain and gangrenous toe.  He was transferred to [facility] and had done Vascular reconstruction and right 3rd toe amputation. He remembers his last A1c was at 11. Blood glucose was 103 on 02/22/18.  Objective: Dermatologic: Post surgical skin sutures in between 2nd and 4th digit right. Macerated open skin at 5th MPJ area right dorsum, 1.5 x 1.5 cm. Vascular: Pedal pulses are not palpable. Temperature is warm to touch on right. Orthopedic: Contracted lesser digits S/P Right 3rd toe amputation due to gangrene. Neurologic: Hyperalgic right foot.  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 Diabetic ulcer skin 5th MPJ area right. S/P Peripheral vascular reconstruction right lower limb.  Treatment: Right foot wound cleansed with H2O2 and Betadine solution. Amerigel ointment dressing applied. Will order daily antibiotic foot soak. Pain medication prescribed. Patient picked up plastic shower leg cover. Return in 2 weeks for suture removal.

## 2018-03-03 NOTE — Patient Instructions (Signed)
Seen for right foot wound. Dressing changed. May benefit from antibiotic foot soak. Will place order. Return in 2 weeks.

## 2018-03-14 DIAGNOSIS — E7211 Homocystinuria: Secondary | ICD-10-CM | POA: Diagnosis not present

## 2018-03-14 DIAGNOSIS — N529 Male erectile dysfunction, unspecified: Secondary | ICD-10-CM | POA: Diagnosis not present

## 2018-03-14 DIAGNOSIS — G629 Polyneuropathy, unspecified: Secondary | ICD-10-CM | POA: Diagnosis not present

## 2018-03-14 DIAGNOSIS — M25561 Pain in right knee: Secondary | ICD-10-CM | POA: Diagnosis not present

## 2018-03-14 DIAGNOSIS — I639 Cerebral infarction, unspecified: Secondary | ICD-10-CM | POA: Diagnosis not present

## 2018-03-14 DIAGNOSIS — Z125 Encounter for screening for malignant neoplasm of prostate: Secondary | ICD-10-CM | POA: Diagnosis not present

## 2018-03-14 DIAGNOSIS — I10 Essential (primary) hypertension: Secondary | ICD-10-CM | POA: Diagnosis not present

## 2018-03-14 DIAGNOSIS — Z87891 Personal history of nicotine dependence: Secondary | ICD-10-CM | POA: Diagnosis not present

## 2018-03-14 DIAGNOSIS — D649 Anemia, unspecified: Secondary | ICD-10-CM | POA: Diagnosis not present

## 2018-03-14 DIAGNOSIS — I119 Hypertensive heart disease without heart failure: Secondary | ICD-10-CM | POA: Diagnosis not present

## 2018-03-14 DIAGNOSIS — N182 Chronic kidney disease, stage 2 (mild): Secondary | ICD-10-CM | POA: Diagnosis not present

## 2018-03-14 DIAGNOSIS — Z0000 Encounter for general adult medical examination without abnormal findings: Secondary | ICD-10-CM | POA: Diagnosis not present

## 2018-03-14 DIAGNOSIS — E1165 Type 2 diabetes mellitus with hyperglycemia: Secondary | ICD-10-CM | POA: Diagnosis not present

## 2018-03-15 ENCOUNTER — Inpatient Hospital Stay (HOSPITAL_COMMUNITY)
Admission: EM | Admit: 2018-03-15 | Discharge: 2018-03-23 | DRG: 854 | Disposition: A | Discharge status: Skilled Nursing Facility | Payer: PPO | Attending: Internal Medicine | Admitting: Internal Medicine

## 2018-03-15 ENCOUNTER — Inpatient Hospital Stay (HOSPITAL_COMMUNITY): Payer: PPO

## 2018-03-15 ENCOUNTER — Encounter (HOSPITAL_COMMUNITY): Payer: Self-pay

## 2018-03-15 ENCOUNTER — Emergency Department (HOSPITAL_COMMUNITY): Payer: PPO

## 2018-03-15 ENCOUNTER — Other Ambulatory Visit: Payer: Self-pay

## 2018-03-15 DIAGNOSIS — I70261 Atherosclerosis of native arteries of extremities with gangrene, right leg: Secondary | ICD-10-CM | POA: Diagnosis not present

## 2018-03-15 DIAGNOSIS — T148XXA Other injury of unspecified body region, initial encounter: Secondary | ICD-10-CM

## 2018-03-15 DIAGNOSIS — G40909 Epilepsy, unspecified, not intractable, without status epilepticus: Secondary | ICD-10-CM | POA: Diagnosis not present

## 2018-03-15 DIAGNOSIS — Z9862 Peripheral vascular angioplasty status: Secondary | ICD-10-CM

## 2018-03-15 DIAGNOSIS — I10 Essential (primary) hypertension: Secondary | ICD-10-CM

## 2018-03-15 DIAGNOSIS — E11621 Type 2 diabetes mellitus with foot ulcer: Secondary | ICD-10-CM | POA: Diagnosis not present

## 2018-03-15 DIAGNOSIS — D509 Iron deficiency anemia, unspecified: Secondary | ICD-10-CM | POA: Diagnosis present

## 2018-03-15 DIAGNOSIS — E1169 Type 2 diabetes mellitus with other specified complication: Secondary | ICD-10-CM | POA: Diagnosis present

## 2018-03-15 DIAGNOSIS — Z4781 Encounter for orthopedic aftercare following surgical amputation: Secondary | ICD-10-CM | POA: Diagnosis not present

## 2018-03-15 DIAGNOSIS — I129 Hypertensive chronic kidney disease with stage 1 through stage 4 chronic kidney disease, or unspecified chronic kidney disease: Secondary | ICD-10-CM | POA: Diagnosis not present

## 2018-03-15 DIAGNOSIS — I252 Old myocardial infarction: Secondary | ICD-10-CM

## 2018-03-15 DIAGNOSIS — N179 Acute kidney failure, unspecified: Secondary | ICD-10-CM | POA: Diagnosis present

## 2018-03-15 DIAGNOSIS — R262 Difficulty in walking, not elsewhere classified: Secondary | ICD-10-CM | POA: Diagnosis not present

## 2018-03-15 DIAGNOSIS — I739 Peripheral vascular disease, unspecified: Secondary | ICD-10-CM | POA: Diagnosis not present

## 2018-03-15 DIAGNOSIS — R488 Other symbolic dysfunctions: Secondary | ICD-10-CM | POA: Diagnosis not present

## 2018-03-15 DIAGNOSIS — L97515 Non-pressure chronic ulcer of other part of right foot with muscle involvement without evidence of necrosis: Secondary | ICD-10-CM | POA: Diagnosis not present

## 2018-03-15 DIAGNOSIS — E785 Hyperlipidemia, unspecified: Secondary | ICD-10-CM | POA: Diagnosis present

## 2018-03-15 DIAGNOSIS — S8990XA Unspecified injury of unspecified lower leg, initial encounter: Secondary | ICD-10-CM | POA: Diagnosis not present

## 2018-03-15 DIAGNOSIS — M609 Myositis, unspecified: Secondary | ICD-10-CM | POA: Diagnosis not present

## 2018-03-15 DIAGNOSIS — E860 Dehydration: Secondary | ICD-10-CM | POA: Diagnosis present

## 2018-03-15 DIAGNOSIS — M60073 Infective myositis, right foot: Secondary | ICD-10-CM | POA: Diagnosis not present

## 2018-03-15 DIAGNOSIS — L03115 Cellulitis of right lower limb: Secondary | ICD-10-CM

## 2018-03-15 DIAGNOSIS — L03031 Cellulitis of right toe: Secondary | ICD-10-CM | POA: Diagnosis not present

## 2018-03-15 DIAGNOSIS — R600 Localized edema: Secondary | ICD-10-CM | POA: Diagnosis not present

## 2018-03-15 DIAGNOSIS — E1129 Type 2 diabetes mellitus with other diabetic kidney complication: Secondary | ICD-10-CM | POA: Diagnosis present

## 2018-03-15 DIAGNOSIS — A419 Sepsis, unspecified organism: Secondary | ICD-10-CM | POA: Diagnosis not present

## 2018-03-15 DIAGNOSIS — L089 Local infection of the skin and subcutaneous tissue, unspecified: Secondary | ICD-10-CM | POA: Diagnosis present

## 2018-03-15 DIAGNOSIS — E1165 Type 2 diabetes mellitus with hyperglycemia: Secondary | ICD-10-CM

## 2018-03-15 DIAGNOSIS — E1122 Type 2 diabetes mellitus with diabetic chronic kidney disease: Secondary | ICD-10-CM | POA: Diagnosis present

## 2018-03-15 DIAGNOSIS — L97519 Non-pressure chronic ulcer of other part of right foot with unspecified severity: Secondary | ICD-10-CM | POA: Diagnosis present

## 2018-03-15 DIAGNOSIS — E1152 Type 2 diabetes mellitus with diabetic peripheral angiopathy with gangrene: Secondary | ICD-10-CM | POA: Diagnosis present

## 2018-03-15 DIAGNOSIS — Z7902 Long term (current) use of antithrombotics/antiplatelets: Secondary | ICD-10-CM | POA: Diagnosis not present

## 2018-03-15 DIAGNOSIS — M6281 Muscle weakness (generalized): Secondary | ICD-10-CM | POA: Diagnosis not present

## 2018-03-15 DIAGNOSIS — R2681 Unsteadiness on feet: Secondary | ICD-10-CM | POA: Diagnosis not present

## 2018-03-15 DIAGNOSIS — T8189XA Other complications of procedures, not elsewhere classified, initial encounter: Secondary | ICD-10-CM | POA: Diagnosis not present

## 2018-03-15 DIAGNOSIS — M79671 Pain in right foot: Secondary | ICD-10-CM | POA: Diagnosis not present

## 2018-03-15 DIAGNOSIS — E876 Hypokalemia: Secondary | ICD-10-CM | POA: Diagnosis not present

## 2018-03-15 DIAGNOSIS — Z794 Long term (current) use of insulin: Secondary | ICD-10-CM

## 2018-03-15 DIAGNOSIS — E11628 Type 2 diabetes mellitus with other skin complications: Secondary | ICD-10-CM | POA: Diagnosis present

## 2018-03-15 DIAGNOSIS — Z89421 Acquired absence of other right toe(s): Secondary | ICD-10-CM | POA: Diagnosis not present

## 2018-03-15 DIAGNOSIS — Z89511 Acquired absence of right leg below knee: Secondary | ICD-10-CM | POA: Diagnosis not present

## 2018-03-15 DIAGNOSIS — M869 Osteomyelitis, unspecified: Secondary | ICD-10-CM | POA: Diagnosis not present

## 2018-03-15 DIAGNOSIS — N182 Chronic kidney disease, stage 2 (mild): Secondary | ICD-10-CM | POA: Diagnosis present

## 2018-03-15 DIAGNOSIS — I2510 Atherosclerotic heart disease of native coronary artery without angina pectoris: Secondary | ICD-10-CM | POA: Diagnosis present

## 2018-03-15 DIAGNOSIS — I96 Gangrene, not elsewhere classified: Secondary | ICD-10-CM | POA: Diagnosis not present

## 2018-03-15 DIAGNOSIS — G8911 Acute pain due to trauma: Secondary | ICD-10-CM | POA: Diagnosis not present

## 2018-03-15 DIAGNOSIS — Z7982 Long term (current) use of aspirin: Secondary | ICD-10-CM

## 2018-03-15 DIAGNOSIS — Z9861 Coronary angioplasty status: Secondary | ICD-10-CM | POA: Diagnosis not present

## 2018-03-15 LAB — I-STAT CG4 LACTIC ACID, ED
LACTIC ACID, VENOUS: 2.15 mmol/L — AB (ref 0.5–1.9)
LACTIC ACID, VENOUS: 1.72 mmol/L (ref 0.5–1.9)

## 2018-03-15 LAB — PROCALCITONIN: Procalcitonin: 0.16 ng/mL

## 2018-03-15 LAB — CBC WITH DIFFERENTIAL/PLATELET
BASOS PCT: 0 %
Basophils Absolute: 0 10*3/uL (ref 0.0–0.1)
Eosinophils Absolute: 0.1 10*3/uL (ref 0.0–0.7)
Eosinophils Relative: 0 %
HEMATOCRIT: 33.3 % — AB (ref 39.0–52.0)
HEMOGLOBIN: 10.8 g/dL — AB (ref 13.0–17.0)
LYMPHS ABS: 2.7 10*3/uL (ref 0.7–4.0)
Lymphocytes Relative: 13 %
MCH: 26.4 pg (ref 26.0–34.0)
MCHC: 32.4 g/dL (ref 30.0–36.0)
MCV: 81.4 fL (ref 78.0–100.0)
MONOS PCT: 5 %
Monocytes Absolute: 1 10*3/uL (ref 0.1–1.0)
NEUTROS ABS: 17.7 10*3/uL — AB (ref 1.7–7.7)
NEUTROS PCT: 82 %
Platelets: 387 10*3/uL (ref 150–400)
RBC: 4.09 MIL/uL — ABNORMAL LOW (ref 4.22–5.81)
RDW: 16 % — ABNORMAL HIGH (ref 11.5–15.5)
WBC: 21.6 10*3/uL — ABNORMAL HIGH (ref 4.0–10.5)

## 2018-03-15 LAB — COMPREHENSIVE METABOLIC PANEL
ALT: 11 U/L — ABNORMAL LOW (ref 17–63)
ANION GAP: 12 (ref 5–15)
AST: 21 U/L (ref 15–41)
Albumin: 3 g/dL — ABNORMAL LOW (ref 3.5–5.0)
Alkaline Phosphatase: 79 U/L (ref 38–126)
BUN: 20 mg/dL (ref 6–20)
CHLORIDE: 97 mmol/L — AB (ref 101–111)
CO2: 24 mmol/L (ref 22–32)
Calcium: 9.2 mg/dL (ref 8.9–10.3)
Creatinine, Ser: 1.5 mg/dL — ABNORMAL HIGH (ref 0.61–1.24)
GFR calc Af Amer: 58 mL/min — ABNORMAL LOW (ref >60)
GFR calc non Af Amer: 50 mL/min — ABNORMAL LOW (ref >60)
Glucose, Bld: 244 mg/dL — ABNORMAL HIGH (ref 65–99)
POTASSIUM: 3.8 mmol/L (ref 3.5–5.1)
SODIUM: 133 mmol/L — AB (ref 135–145)
Total Bilirubin: 0.6 mg/dL (ref 0.3–1.2)
Total Protein: 8.9 g/dL — ABNORMAL HIGH (ref 6.5–8.1)

## 2018-03-15 LAB — PROTIME-INR
INR: 1.24
Prothrombin Time: 15.5 seconds — ABNORMAL HIGH (ref 11.4–15.2)

## 2018-03-15 LAB — C-REACTIVE PROTEIN: CRP: 21.9 mg/dL — AB (ref <1.0)

## 2018-03-15 LAB — CBG MONITORING, ED: GLUCOSE-CAPILLARY: 140 mg/dL — AB (ref 65–99)

## 2018-03-15 LAB — SEDIMENTATION RATE: Sed Rate: 127 mm/hr — ABNORMAL HIGH (ref 0–16)

## 2018-03-15 LAB — LACTIC ACID, PLASMA: LACTIC ACID, VENOUS: 1.6 mmol/L (ref 0.5–1.9)

## 2018-03-15 MED ORDER — HYDRALAZINE HCL 20 MG/ML IJ SOLN: 5.0000 mg | INTRAMUSCULAR | Status: DC | PRN

## 2018-03-15 MED ORDER — OXYCODONE-ACETAMINOPHEN 10-325 MG PO TABS: 1.0000 | ORAL_TABLET | Freq: Four times a day (QID) | ORAL | Status: DC | PRN

## 2018-03-15 MED ORDER — ENSURE ENLIVE PO LIQD
237.0000 mL | Freq: Two times a day (BID) | ORAL | Status: DC
  Administered 2018-03-16: 237 mL via ORAL
  Filled 2018-03-15: qty 237

## 2018-03-15 MED ORDER — LORAZEPAM 2 MG/ML IJ SOLN: 1.0000 mg | INTRAMUSCULAR | Status: DC | PRN

## 2018-03-15 MED ORDER — ATORVASTATIN CALCIUM 20 MG PO TABS: 20.0000 mg | ORAL_TABLET | Freq: Every day | ORAL | Status: DC

## 2018-03-15 MED ORDER — VANCOMYCIN HCL IN DEXTROSE 1-5 GM/200ML-% IV SOLN
1000.0000 mg | INTRAVENOUS | Status: DC
  Administered 2018-03-16 – 2018-03-17 (×3): 1000 mg via INTRAVENOUS
  Filled 2018-03-15 (×3): qty 200

## 2018-03-15 MED ORDER — ONDANSETRON HCL 4 MG/2ML IJ SOLN
4.0000 mg | Freq: Three times a day (TID) | INTRAMUSCULAR | Status: DC | PRN
  Administered 2018-03-17 – 2018-03-18 (×2): 4 mg via INTRAVENOUS
  Filled 2018-03-15 (×2): qty 2

## 2018-03-15 MED ORDER — SODIUM CHLORIDE 0.9 % IV BOLUS (SEPSIS)
1500.0000 mL | Freq: Once | INTRAVENOUS | Status: AC
  Administered 2018-03-15: 1500 mL via INTRAVENOUS

## 2018-03-15 MED ORDER — ONDANSETRON HCL 4 MG/2ML IJ SOLN
4.0000 mg | Freq: Once | INTRAMUSCULAR | Status: AC
  Administered 2018-03-15: 4 mg via INTRAVENOUS
  Filled 2018-03-15: qty 2

## 2018-03-15 MED ORDER — FOLIC ACID 1 MG PO TABS
1.0000 mg | ORAL_TABLET | Freq: Every day | ORAL | Status: DC
  Administered 2018-03-16 – 2018-03-23 (×8): 1 mg via ORAL
  Filled 2018-03-15 (×8): qty 1

## 2018-03-15 MED ORDER — CLOPIDOGREL BISULFATE 75 MG PO TABS
75.0000 mg | ORAL_TABLET | Freq: Every day | ORAL | Status: DC
  Administered 2018-03-16 – 2018-03-23 (×7): 75 mg via ORAL
  Filled 2018-03-15 (×7): qty 1

## 2018-03-15 MED ORDER — TIZANIDINE HCL 4 MG PO TABS
4.0000 mg | ORAL_TABLET | Freq: Three times a day (TID) | ORAL | Status: DC | PRN
  Administered 2018-03-17 – 2018-03-21 (×2): 4 mg via ORAL
  Filled 2018-03-15 (×3): qty 1

## 2018-03-15 MED ORDER — OXYCODONE HCL 5 MG PO TABS
5.0000 mg | ORAL_TABLET | Freq: Four times a day (QID) | ORAL | Status: DC | PRN
  Administered 2018-03-15 – 2018-03-17 (×5): 5 mg via ORAL
  Filled 2018-03-15 (×6): qty 1

## 2018-03-15 MED ORDER — ACETAMINOPHEN 325 MG PO TABS
650.0000 mg | ORAL_TABLET | Freq: Four times a day (QID) | ORAL | Status: DC | PRN
  Administered 2018-03-16 – 2018-03-20 (×6): 650 mg via ORAL
  Filled 2018-03-15 (×6): qty 2

## 2018-03-15 MED ORDER — ZOLPIDEM TARTRATE 5 MG PO TABS
5.0000 mg | ORAL_TABLET | Freq: Every evening | ORAL | Status: DC | PRN
  Administered 2018-03-18: 5 mg via ORAL
  Filled 2018-03-15: qty 1

## 2018-03-15 MED ORDER — HEPARIN SODIUM (PORCINE) 5000 UNIT/ML IJ SOLN
5000.0000 [IU] | Freq: Three times a day (TID) | INTRAMUSCULAR | Status: DC
  Administered 2018-03-15 – 2018-03-23 (×21): 5000 [IU] via SUBCUTANEOUS
  Filled 2018-03-15 (×21): qty 1

## 2018-03-15 MED ORDER — AMLODIPINE BESYLATE 5 MG PO TABS
5.0000 mg | ORAL_TABLET | Freq: Every day | ORAL | Status: DC
  Administered 2018-03-16 – 2018-03-23 (×8): 5 mg via ORAL
  Filled 2018-03-15 (×8): qty 1

## 2018-03-15 MED ORDER — GABAPENTIN 600 MG PO TABS
600.0000 mg | ORAL_TABLET | Freq: Two times a day (BID) | ORAL | Status: DC
  Administered 2018-03-16 – 2018-03-23 (×16): 600 mg via ORAL
  Filled 2018-03-15 (×16): qty 1

## 2018-03-15 MED ORDER — INSULIN ASPART 100 UNIT/ML ~~LOC~~ SOLN
0.0000 [IU] | Freq: Three times a day (TID) | SUBCUTANEOUS | Status: DC
  Administered 2018-03-16 – 2018-03-18 (×4): 1 [IU] via SUBCUTANEOUS
  Administered 2018-03-20 (×2): 2 [IU] via SUBCUTANEOUS
  Administered 2018-03-20: 3 [IU] via SUBCUTANEOUS
  Administered 2018-03-21: 1 [IU] via SUBCUTANEOUS
  Administered 2018-03-21 (×2): 2 [IU] via SUBCUTANEOUS
  Administered 2018-03-22: 5 [IU] via SUBCUTANEOUS
  Administered 2018-03-22 – 2018-03-23 (×2): 2 [IU] via SUBCUTANEOUS
  Filled 2018-03-15: qty 1

## 2018-03-15 MED ORDER — FERROUS SULFATE 325 (65 FE) MG PO TABS
325.0000 mg | ORAL_TABLET | Freq: Two times a day (BID) | ORAL | Status: DC
  Administered 2018-03-16 – 2018-03-23 (×14): 325 mg via ORAL
  Filled 2018-03-15 (×16): qty 1

## 2018-03-15 MED ORDER — METRONIDAZOLE IN NACL 5-0.79 MG/ML-% IV SOLN
500.0000 mg | Freq: Three times a day (TID) | INTRAVENOUS | Status: DC
  Administered 2018-03-15 – 2018-03-20 (×14): 500 mg via INTRAVENOUS
  Filled 2018-03-15 (×14): qty 100

## 2018-03-15 MED ORDER — DOCUSATE SODIUM 100 MG PO CAPS
100.0000 mg | ORAL_CAPSULE | Freq: Two times a day (BID) | ORAL | Status: DC
  Administered 2018-03-16 – 2018-03-23 (×12): 100 mg via ORAL
  Filled 2018-03-15 (×13): qty 1

## 2018-03-15 MED ORDER — PIPERACILLIN-TAZOBACTAM 3.375 G IVPB 30 MIN
3.3750 g | Freq: Once | INTRAVENOUS | Status: AC
  Administered 2018-03-15: 3.375 g via INTRAVENOUS
  Filled 2018-03-15: qty 50

## 2018-03-15 MED ORDER — LEVETIRACETAM 500 MG PO TABS
500.0000 mg | ORAL_TABLET | Freq: Two times a day (BID) | ORAL | Status: DC
  Administered 2018-03-16 – 2018-03-23 (×16): 500 mg via ORAL
  Filled 2018-03-15 (×16): qty 1

## 2018-03-15 MED ORDER — ASPIRIN EC 81 MG PO TBEC
81.0000 mg | DELAYED_RELEASE_TABLET | Freq: Every day | ORAL | Status: DC
  Administered 2018-03-16 – 2018-03-23 (×7): 81 mg via ORAL
  Filled 2018-03-15 (×7): qty 1

## 2018-03-15 MED ORDER — SODIUM CHLORIDE 0.9 % IV SOLN
INTRAVENOUS | Status: DC
Start: 2018-03-15 — End: 2018-03-20
  Administered 2018-03-15: 21:00:00 via INTRAVENOUS
  Administered 2018-03-16: 100 mL/h via INTRAVENOUS
  Administered 2018-03-16 – 2018-03-19 (×4): via INTRAVENOUS

## 2018-03-15 MED ORDER — SODIUM CHLORIDE 0.9 % IV SOLN
2.0000 g | INTRAVENOUS | Status: DC
  Administered 2018-03-15 – 2018-03-17 (×3): 2 g via INTRAVENOUS
  Filled 2018-03-15 (×3): qty 20

## 2018-03-15 MED ORDER — POLYETHYLENE GLYCOL 3350 17 G PO PACK: 17.0000 g | PACK | Freq: Every day | ORAL | Status: DC | PRN

## 2018-03-15 MED ORDER — INSULIN GLARGINE 100 UNIT/ML ~~LOC~~ SOLN
7.0000 [IU] | Freq: Every day | SUBCUTANEOUS | Status: DC
  Administered 2018-03-16: 7 [IU] via SUBCUTANEOUS
  Filled 2018-03-15 (×3): qty 0.07

## 2018-03-15 MED ORDER — MORPHINE SULFATE (PF) 4 MG/ML IV SOLN
4.0000 mg | Freq: Once | INTRAVENOUS | Status: AC
  Administered 2018-03-15: 4 mg via INTRAVENOUS
  Filled 2018-03-15: qty 1

## 2018-03-15 MED ORDER — OXYCODONE-ACETAMINOPHEN 5-325 MG PO TABS
1.0000 | ORAL_TABLET | Freq: Four times a day (QID) | ORAL | Status: DC | PRN
  Administered 2018-03-15 – 2018-03-17 (×5): 1 via ORAL
  Filled 2018-03-15 (×6): qty 1

## 2018-03-15 NOTE — H&P (Signed)
History and Physical    [name] SNK:[id number] DOB: November 02, 1961 DOA: 03/15/2018  Referring MD/NP/PA:   PCP: Benito Mccreedy, MD   Patient coming from:  The patient is coming from home.  At baseline, pt is independent for most of ADL.  Chief Complaint: right foot pain  HPI: Luke Manning is a 57 y.o. male with medical history significant of hypertension, hyperlipidemia, diabetes mellitus, CAD, iron deficiency anemia, CKD-2, seizure, PVD, who presents with right foot pain.  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 He was supposed to take Abx, electronically, but he did not take it since he could not afford it. Pt states that he has worsening right foot pain in the past several days, which has been progressively getting worse. The wound in the base of 3rd toe is not healing up. The pain is constant, sharp, 10 out of 10 in severity, nonradiating. His right foot is also more swollen. He denies fever and chills. Patient does not have chest pain, shortness breath, cough. No nausea, vomiting, diarrhea, abdominal pain, symptoms of UTI. No unilateral weakness.  ED Course: pt was found to have WBC 21.6, lactic acid is 2.15, worsening renal function, temperature normal, tachycardia, tachypnea, oxygen saturation section 97% on room air. Patient is admitted to telemetry bed as inpatient.  # X-ray of right foot showed: Postsurgical change compatible with amputation of the third toe distal to the head of the metatarsal. Air in the adjacent soft tissues which may be postsurgical, but cannot exclude infection.  Review of Systems:   General: no fevers, chills, no body weight gain, has poor appetite, has fatigue HEENT: no blurry vision, hearing changes or sore throat Respiratory: no dyspnea, coughing, wheezing CV: no chest pain, no palpitations GI: no nausea, vomiting, abdominal pain,  diarrhea, constipation GU: no dysuria, burning on urination, increased urinary frequency, hematuria  Ext: no leg edema Neuro: no unilateral weakness, numbness, or tingling, no vision change or hearing loss Skin: no rash, has wound in right foot MSK: No muscle spasm, no deformity, no limitation of range of movement in spin Heme: No easy bruising.  Travel history: No recent long distant travel.  Allergy: No Known Allergies  Past Medical History:  Diagnosis Date  . Diabetes mellitus without complication ([place])   . MI, old     Past Surgical History:  Procedure Laterality Date  . ABDOMINAL AORTOGRAM W/LOWER EXTREMITY N/A 02/14/2018   Procedure: ABDOMINAL AORTOGRAM W/LOWER EXTREMITY;  Surgeon: Elam Dutch, MD;  Location: [place] [facility];  Service: Cardiovascular;  Laterality: N/A;  . AMPUTATION Right 02/17/2018   Procedure: AMPUTATION, RIGHT 3RD TOE;  Surgeon: Marybelle Killings, MD;  Location: [place];  Service: Orthopedics;  Laterality: Right;  . PERIPHERAL VASCULAR BALLOON ANGIOPLASTY Right 02/14/2018   Procedure: PERIPHERAL VASCULAR BALLOON ANGIOPLASTY;  Surgeon: Elam Dutch, MD;  Location: [place] [facility];  Service: Cardiovascular;  Laterality: Right;  sfa    Social History:  reports that he has never smoked. He has quit using smokeless tobacco. He reports that he does not drink alcohol or use drugs.  Family History:  Family History  Problem Relation Age of Onset  . Cancer Father   . Diabetes Neg Hx      Prior to Admission medications   Medication Sig Start Date End Date Taking? Authorizing Provider  amLODipine (NORVASC) 5 MG tablet Take 5 mg by  mouth daily.    [provider]  aspirin 81 MG EC tablet Take 1 tablet (81 mg total) by mouth daily. 02/22/18   Regalado, Belkys A, MD  atorvastatin (LIPITOR) 20 MG tablet Take 1 tablet (20 mg total) by mouth daily at 6 PM. 02/22/18   Regalado, Belkys A, MD  cephALEXin (KEFLEX) 500 MG capsule Take 1 capsule (500 mg total)  by mouth every 6 (six) hours. 02/22/18   Regalado, Jerald Kief A, MD  clopidogrel (PLAVIX) 75 MG tablet Take 1 tablet (75 mg total) by mouth daily with breakfast. 02/22/18   Regalado, Belkys A, MD  docusate sodium (COLACE) 100 MG capsule Take 1 capsule (100 mg total) by mouth 2 (two) times daily. 02/22/18   Regalado, Belkys A, MD  feeding supplement, ENSURE ENLIVE, (ENSURE ENLIVE) LIQD Take 237 mLs by mouth 2 (two) times daily between meals. 02/22/18   Regalado, Belkys A, MD  ferrous sulfate 325 (65 FE) MG tablet Take 1 tablet (325 mg total) by mouth 2 (two) times daily with a meal. 02/22/18   Regalado, Belkys A, MD  folic acid (FOLVITE) 1 MG tablet Take 1 mg by mouth daily. 11/29/17   [provider]  gabapentin (NEURONTIN) 600 MG tablet Take 600 mg by mouth 2 (two) times daily. 01/16/18   [provider]  insulin glargine (LANTUS) 100 UNIT/ML injection Inject 0.1 mLs (10 Units total) into the skin at bedtime. 02/22/18   Regalado, Belkys A, MD  levETIRAcetam (KEPPRA) 500 MG tablet Take 500 mg by mouth 2 (two) times daily.  01/28/18   [provider]  metFORMIN (GLUCOPHAGE) 500 MG tablet Take 250 mg by mouth daily with breakfast.     [provider]  metroNIDAZOLE (FLAGYL) 500 MG tablet Take 1 tablet (500 mg total) by mouth every 8 (eight) hours. 02/22/18   Regalado, Belkys A, MD  oxyCODONE (OXY IR/ROXICODONE) 5 MG immediate release tablet Take 1-2 tablets (5-10 mg total) by mouth every 4 (four) hours as needed for moderate pain. 02/22/18   Meredith Pel, MD  oxyCODONE-acetaminophen (PERCOCET) 10-325 MG tablet Take 1 tablet by mouth every 12 (twelve) hours as needed for pain. 03/03/18   Sheard, Myeong O, DPM  Syringe/Needle, Disp, (SYRINGE 6CC/20GX1") 20G X 1" 6 ML MISC 1 application by Does not apply route daily. 02/22/18   Regalado, Belkys A, MD  tiZANidine (ZANAFLEX) 4 MG tablet TAKE 2 TABLETS BY MOUTH 3 TIMES A DAY AS NEEDED 01/14/18   [provider]    Physical  Exam: Vitals:   03/16/18 0500 03/16/18 0530 03/16/18 0600 03/16/18 0630  BP: 114/68 109/65 122/66 124/79  Pulse: 89 96 97 (!) 101  Resp: 18 18 19 18   Temp:      SpO2: 99% 98% 96% 93%  Weight:      Height:       General: Not in acute distress HEENT:       Eyes: PERRL, EOMI, no scleral icterus.       ENT: No discharge from the ears and nose, no pharynx injection, no tonsillar enlargement.        Neck: No JVD, no bruit, no mass felt. Heme: No neck lymph node enlargement. Cardiac: S1/S2, RRR, No murmurs, No gallops or rubs. Respiratory: No rales, wheezing, rhonchi or rubs. GI: Soft, nondistended, nontender, no rebound pain, no organomegaly, BS present. GU: No hematuria Ext: No pitting leg edema bilaterally. 1+DP/PT pulse bilaterally. Musculoskeletal: No joint deformities, No joint redness or warmth, no limitation of  ROM in spin. Skin: There is nonhealing wound at the base of 3rd toe, previous stitches are exposed, pus can be expressed upon compression. There is an another eschared lesion in the lateral side of right foot without draining.  Neuro: Alert, oriented X3, cranial nerves II-XII grossly intact, moves all extremities normally.  Psych: Patient is not psychotic, no suicidal or hemocidal ideation.  Labs on Admission: I have personally reviewed following labs and imaging studies  CBC: Recent Labs  Lab 03/15/18 1813 03/16/18 0228  WBC 21.6* 19.1*  NEUTROABS 17.7*  --   HGB 10.8* 10.8*  HCT 33.3* 33.0*  MCV 81.4 80.9  PLT 387 161   Basic Metabolic Panel: Recent Labs  Lab 03/15/18 1813 03/16/18 0228  NA 133* 138  K 3.8 3.9  CL 97* 102  CO2 24 24  GLUCOSE 244* 155*  BUN 20 20  CREATININE 1.50* 1.63*  CALCIUM 9.2 8.3*   GFR: Estimated Creatinine Clearance: 36.5 mL/min (A) (by C-G formula based on SCr of 1.63 mg/dL (H)). Liver Function Tests: Recent Labs  Lab 03/15/18 1813  AST 21  ALT 11*  ALKPHOS 79  BILITOT 0.6  PROT 8.9*  ALBUMIN 3.0*   No results for  input(s): LIPASE, AMYLASE in the last 168 hours. No results for input(s): AMMONIA in the last 168 hours. Coagulation Profile: Recent Labs  Lab 03/15/18 2130  INR 1.24   Cardiac Enzymes: No results for input(s): CKTOTAL, CKMB, CKMBINDEX, TROPONINI in the last 168 hours. BNP (last 3 results) No results for input(s): PROBNP in the last 8760 hours. HbA1C: No results for input(s): HGBA1C in the last 72 hours. CBG: Recent Labs  Lab 03/15/18 2340 03/16/18 0046  GLUCAP 140* 146*   Lipid Profile: No results for input(s): CHOL, HDL, LDLCALC, TRIG, CHOLHDL, LDLDIRECT in the last 72 hours. Thyroid Function Tests: No results for input(s): TSH, T4TOTAL, FREET4, T3FREE, THYROIDAB in the last 72 hours. Anemia Panel: No results for input(s): VITAMINB12, FOLATE, FERRITIN, TIBC, IRON, RETICCTPCT in the last 72 hours. Urine analysis:    Component Value Date/Time   COLORURINE YELLOW 03/15/2018 0421   APPEARANCEUR CLEAR 03/15/2018 0421   LABSPEC 1.012 03/15/2018 0421   PHURINE 5.0 03/15/2018 0421   GLUCOSEU 50 (A) 03/15/2018 0421   HGBUR SMALL (A) 03/15/2018 0421   BILIRUBINUR NEGATIVE 03/15/2018 0421   KETONESUR NEGATIVE 03/15/2018 0421   PROTEINUR NEGATIVE 03/15/2018 0421   NITRITE NEGATIVE 03/15/2018 0421   LEUKOCYTESUR NEGATIVE 03/15/2018 0421   Sepsis Labs: @LABRCNTIP (procalcitonin:4,lacticidven:4) )No results found for this or any previous visit (from the past 240 hour(s)).   Radiological Exams on Admission: Mr Foot Right Wo Contrast  Result Date: 03/15/2018 CLINICAL DATA:  Ulceration along the lateral aspect of the forefoot adjacent to the fifth metatarsal. Recent amputation of the third toe 1 month ago. Diabetes. EXAM: MRI OF THE RIGHT FOREFOOT WITHOUT CONTRAST TECHNIQUE: Multiplanar, multisequence MR imaging of the right forefoot was performed. No intravenous contrast was administered. COMPARISON:  Radiographs from the same day FINDINGS: Bones/Joint/Cartilage Maintained cortices  without bone destruction or fracture. Minor reactive marrow edema of the lateral aspect of the fifth metatarsal head and neck and base of fifth metatarsal. No joint dislocation or effusions. Status post amputation of the third toe at the MTP articulation. Mild joint space narrowing, spurring and subchondral cystic change across the talonavicular joint with minor reactive edema along the plantar aspect of the navicular. Mild cuboid reactive edema without cortical bone loss. Ligaments Negative Muscles and Tendons Myositis of the  plantar muscles of the forefoot. The flexor and extensor tendons crossing the forefoot demonstrate no acute tear or significant tenosynovitis. Soft tissues Diffuse soft tissue swelling of the forefoot without focal fluid collections. Subcutaneous emphysema is noted of the forefoot. Soft tissue ulceration along the lateral aspect of the forefoot likely accounts for the emphysema. IMPRESSION: 1. Cellulitis and myositis of the forefoot with soft tissue ulceration adjacent to the lateral aspect of the fifth metatarsal head. Associated subcutaneous emphysema is seen of the forefoot. 2. Reactive marrow edema is noted without cortical bone loss involving the distal fifth metatarsal, proximal fifth phalanx and cuboid. 3. Status post amputation of the third toe. Electronically Signed   By: Ashley Royalty M.D.   On: 03/15/2018 23:22   Dg Foot Complete Right  Result Date: 03/15/2018 CLINICAL DATA:  Right foot pain after toe amputation a couple weeks ago. EXAM: RIGHT FOOT COMPLETE - 3+ VIEW COMPARISON:  02/11/2018 FINDINGS: Interval amputation of the third toe distal to the head of the metatarsal. Minimal associated air in the soft tissues. Small vessel atherosclerotic disease is present. There is mild degenerative change over the midfoot. There is a small inferior calcaneal spur. IMPRESSION: Postsurgical change compatible with amputation of the third toe distal to the head of the metatarsal. Air in the  adjacent soft tissues which may be postsurgical although cannot exclude infection. Electronically Signed   By: Marin Olp M.D.   On: 03/15/2018 18:58     EKG: Independently reviewed.  Not done in ED, will get one.   Assessment/Plan Principal Problem:   Diabetic foot infection ([place]) Active Problems:   Type II diabetes mellitus with renal manifestations ([facility])   HTN (hypertension)   Seizure disorder ([facility])   Wound infection   HLD (hyperlipidemia)   PVD (peripheral vascular disease) ([facility])   CAD (coronary artery disease)   Iron deficiency anemia   Acute renal failure superimposed on stage 2 chronic kidney disease (H[facility])   Sepsis ([place])  Sepsis due to diabetic right foot wound infection: patient meets criteria for sepsis with leukocytosis, tachycardia, tachypnea. Lactic acid is elevated 2.15. Currently hemodynamically stable. - will admit to tele bed as inpt - Empiric antimicrobial treatment with vancomycin, Rocephin and Flagyl (patient received one dose of Zosyn in ED - PRN Zofran for nausea, Percocet for pain - Blood cultures x 2  - ESR and CRP - wound care consult - MRI-right foot - will get Procalcitonin and trend lactic acid levels per sepsis protocol. - IVF: 1.5 L of NS bolus in ED, followed by 100 cc/h  Type II diabetes mellitus with renal manifestations ([place]): Last A1c 11.4 on 2/201/19, poorly controled. Patient is taking Lantus and metforminat home -will decrease Lantus dose from 10 to 7 U daily -SSI  HTN:  -Continue home medications:amlodipine, -IV hydralazine prn  Seizure -Seizure precaution -When necessary Ativan for seizure -Continue Home medications: keppra -check Keppra level  HLD: -lipitor  PVD (peripheral vascular disease) ([place]): s/p of angioplasty of right superficial femoral artery by vascular surgery 02/14/18  -on plavix and ASA -repeat ABI  Hx of CAD; no CP -on ASA and lipitor  Iron deficiency anemia: Hgb 10.8. stable -continue ferrous sulfate    -f/u by CBC  AoCKD-II: Baseline Cre is 1.1-1.2, pt's Cre is 1.50 and BUN 20 on admission. Likely due to prerenal secondary to dehydration - IVF as above - Check FeNa - Follow up renal function by BMP    DVT ppx: SQ Heparin   Code Status: Full  code Family Communication: None at bed side.   Disposition Plan:  Anticipate discharge back to previous home environment Consults called:  none Admission status:  Inpatient/tele     Date of Service 03/16/2018    Ivor Costa [facility] Pager [phone]  If 7PM-7AM, please contact night-coverage [email or web address] Password [facility] 03/16/2018, 6:46 AM

## 2018-03-15 NOTE — ED Notes (Signed)
ED Provider at bedside. 

## 2018-03-15 NOTE — ED Triage Notes (Signed)
Pt presents to the ed with complaints of having pain in his left foot where he recently had a toe removed. States he also wants his stitches taken out.

## 2018-03-15 NOTE — ED Notes (Signed)
Pt states unable to void at this time, urinal and call light at bedside. Pt aware of need for urine sample.

## 2018-03-15 NOTE — Progress Notes (Signed)
Pharmacy Antibiotic Note  Luke Manning is a 57 y.o. male admitted on 03/15/2018 with sepsis.    Plan: Ceftraxone per md Vanc 1 g q24h Monitor renal fx cx vt prn  Height: 5\' 2"  (157.5 cm) Weight: 115 lb (52.2 kg) IBW/kg (Calculated) : 54.6  Temp (24hrs), Avg:97.6 F (36.4 C), Min:97.6 F (36.4 C), Max:97.6 F (36.4 C)  Recent Labs  Lab 03/15/18 1813 03/15/18 1839  WBC 21.6*  --   CREATININE 1.50*  --   LATICACIDVEN  --  2.15*    Estimated Creatinine Clearance: 39.6 mL/min (A) (by C-G formula based on SCr of 1.5 mg/dL (H)).    No Known Allergies  Luke Manning, PharmD, BCPS, BCCCP Clinical Pharmacist Clinical phone for 03/15/2018 from [phone]: [phone] If after 2300, please call main pharmacy at: [phone] 03/15/2018 8:19 PM

## 2018-03-15 NOTE — ED Provider Notes (Addendum)
[facility] Provider Note   CSN: [id number] Arrival date & time: 03/15/18  1747     History   Chief Complaint Chief Complaint  Patient presents with  . Foot Pain    HPI Luke Manning is a 57 y.o. male.  Pt presents to the ED today with right foot pain.  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  The pt also had angioplasty of right superficial femoral artery by vascular surgery during that admission.  Pt has been having pain in the foot since surgery.  He now has a wound on the lateral side of his right foot that is very painful.  Pt did see his podiatrist on 3/11 for this and was rx'd antibiotic soaks.  The pain is worse today and is also red and swollen.     Past Medical History:  Diagnosis Date  . Diabetes mellitus without complication (HCC)   . MI, old     Patient Active Problem List   Diagnosis Date Noted  . Wound infection 03/15/2018  . Diabetic foot infection (HCC) 03/15/2018  . HLD (hyperlipidemia) 03/15/2018  . PVD (peripheral vascular disease) (HCC) 03/15/2018  . CAD (coronary artery disease) 03/15/2018  . Iron deficiency anemia 03/15/2018  . Acute renal failure superimposed on stage 2 chronic kidney disease (HCC) 03/15/2018  . Sepsis (HCC) 03/15/2018  . Poorly controlled type 2 diabetes mellitus (HCC)   . Gangrene of toe of right foot (HCC) 02/12/2018  . Type II diabetes mellitus with renal manifestations (HCC) 02/12/2018  . HTN (hypertension) 02/12/2018  . Seizure disorder (HCC) 02/12/2018  . Ischemic pain of foot, right 02/11/2018  . Gangrene of foot (HCC) 02/11/2018    Past Surgical History:  Procedure Laterality Date  . ABDOMINAL AORTOGRAM W/LOWER EXTREMITY N/A 02/14/2018   Procedure: ABDOMINAL AORTOGRAM W/LOWER EXTREMITY;  Surgeon: Sherren KernsFields, Charles E, MD;  Location: [facility] INVASIVE CV LAB;  Service: Cardiovascular;  Laterality: N/A;  . AMPUTATION Right 02/17/2018   Procedure:  AMPUTATION, RIGHT 3RD TOE;  Surgeon: Eldred MangesYates, Mark C, MD;  Location: [facility] OR;  Service: Orthopedics;  Laterality: Right;  . PERIPHERAL VASCULAR BALLOON ANGIOPLASTY Right 02/14/2018   Procedure: PERIPHERAL VASCULAR BALLOON ANGIOPLASTY;  Surgeon: Sherren KernsFields, Charles E, MD;  Location: [facility] INVASIVE CV LAB;  Service: Cardiovascular;  Laterality: Right;  sfa        Home Medications    Prior to Admission medications   Medication Sig Start Date End Date Taking? Authorizing Provider  amLODipine (NORVASC) 5 MG tablet Take 5 mg by mouth daily.    [provider]  aspirin 81 MG EC tablet Take 1 tablet (81 mg total) by mouth daily. 02/22/18   Regalado, Belkys A, MD  atorvastatin (LIPITOR) 20 MG tablet Take 1 tablet (20 mg total) by mouth daily at 6 PM. 02/22/18   Regalado, Belkys A, MD  cephALEXin (KEFLEX) 500 MG capsule Take 1 capsule (500 mg total) by mouth every 6 (six) hours. 02/22/18   Regalado, Jon BillingsBelkys A, MD  clopidogrel (PLAVIX) 75 MG tablet Take 1 tablet (75 mg total) by mouth daily with breakfast. 02/22/18   Regalado, Belkys A, MD  docusate sodium (COLACE) 100 MG capsule Take 1 capsule (100 mg total) by mouth 2 (two) times daily. 02/22/18   Regalado, Belkys A, MD  feeding supplement, ENSURE ENLIVE, (ENSURE ENLIVE) LIQD Take 237 mLs by mouth 2 (two) times daily between meals. 02/22/18   Regalado, Prentiss BellsBelkys A, MD  ferrous sulfate 325 (65 FE) MG tablet Take 1 tablet (325 mg total) by mouth 2 (two) times daily with a meal. 02/22/18   Regalado, Belkys A, MD  folic acid (FOLVITE) 1 MG tablet Take 1 mg by mouth daily. 11/29/17   [provider]  gabapentin (NEURONTIN) 600 MG tablet Take 600 mg by mouth 2 (two) times daily. 01/16/18   [provider]  insulin glargine (LANTUS) 100 UNIT/ML injection Inject 0.1 mLs (10 Units total) into the skin at bedtime. 02/22/18   Regalado, Belkys A, MD  levETIRAcetam (KEPPRA) 500 MG tablet Take 500 mg by mouth 2 (two) times daily.  01/28/18   [provider]    metFORMIN (GLUCOPHAGE) 500 MG tablet Take 250 mg by mouth daily with breakfast.     [provider]  metroNIDAZOLE (FLAGYL) 500 MG tablet Take 1 tablet (500 mg total) by mouth every 8 (eight) hours. 02/22/18   Regalado, Belkys A, MD  oxyCODONE (OXY IR/ROXICODONE) 5 MG immediate release tablet Take 1-2 tablets (5-10 mg total) by mouth every 4 (four) hours as needed for moderate pain. 02/22/18   Cammy Copa, MD  oxyCODONE-acetaminophen (PERCOCET) 10-325 MG tablet Take 1 tablet by mouth every 12 (twelve) hours as needed for pain. 03/03/18   Sheard, Myeong O, DPM  Syringe/Needle, Disp, (SYRINGE 6CC/20GX1") 20G X 1" 6 ML MISC 1 application by Does not apply route daily. 02/22/18   Regalado, Belkys A, MD  tiZANidine (ZANAFLEX) 4 MG tablet TAKE 2 TABLETS BY MOUTH 3 TIMES A DAY AS NEEDED 01/14/18   [provider]    Family History Family History  Problem Relation Age of Onset  . Cancer Father   . Diabetes Neg Hx     Social History Social History   Tobacco Use  . Smoking status: Never Smoker  . Smokeless tobacco: Former Engineer, water Use Topics  . Alcohol use: No    Frequency: Never  . Drug use: No     Allergies   Patient has no known allergies.   Review of Systems Review of Systems  Musculoskeletal:       Right foot pain and swelling  All other systems reviewed and are negative.    Physical Exam Updated Vital Signs BP 114/83   Pulse (!) 129   Temp 97.6 F (36.4 C)   Resp 19   Ht 5\' 2"  (1.575 m)   Wt 52.2 kg (115 lb)   SpO2 (!) 57%   BMI 21.03 kg/m   Physical Exam  Constitutional: He is oriented to person, place, and time. He appears well-developed and well-nourished.  HENT:  Head: Normocephalic and atraumatic.  Right Ear: External ear normal.  Left Ear: External ear normal.  Nose: Nose normal.  Mouth/Throat: Oropharynx is clear and moist.  Eyes: Pupils are equal, round, and reactive to light. Conjunctivae and EOM are normal.  Neck: Normal  range of motion. Neck supple.  Cardiovascular: Normal rate, regular rhythm, normal heart sounds and intact distal pulses.  DP and PT easily dopplerable  Pulmonary/Chest: Effort normal and breath sounds normal.  Abdominal: Soft. Bowel sounds are normal.  Musculoskeletal:  See pictures right foot  Neurological: He is alert and oriented to person, place, and time.  Skin: Skin is warm. Capillary refill takes less than 2 seconds.  Psychiatric: He has a normal mood and affect. His behavior is normal.  Nursing note and vitals reviewed.        ED Treatments / Results  Labs (all  labs ordered are listed, but only abnormal results are displayed) Labs Reviewed  COMPREHENSIVE METABOLIC PANEL - Abnormal; Notable for the following components:      Result Value   Sodium 133 (*)    Chloride 97 (*)    Glucose, Bld 244 (*)    Creatinine, Ser 1.50 (*)    Total Protein 8.9 (*)    Albumin 3.0 (*)    ALT 11 (*)    GFR calc non Af Amer 50 (*)    GFR calc Af Amer 58 (*)    All other components within normal limits  CBC WITH DIFFERENTIAL/PLATELET - Abnormal; Notable for the following components:   WBC 21.6 (*)    RBC 4.09 (*)    Hemoglobin 10.8 (*)    HCT 33.3 (*)    RDW 16.0 (*)    Neutro Abs 17.7 (*)    All other components within normal limits  I-STAT CG4 LACTIC ACID, ED - Abnormal; Notable for the following components:   Lactic Acid, Venous 2.15 (*)    All other components within normal limits  CULTURE, BLOOD (ROUTINE X 2)  CULTURE, BLOOD (ROUTINE X 2)  URINALYSIS, ROUTINE W REFLEX MICROSCOPIC  LEVETIRACETAM LEVEL  CREATININE, URINE, RANDOM  SODIUM, URINE, RANDOM  LACTIC ACID, PLASMA  LACTIC ACID, PLASMA  PROCALCITONIN  PROTIME-INR  HIV ANTIBODY (ROUTINE TESTING)  SEDIMENTATION RATE  C-REACTIVE PROTEIN  BASIC METABOLIC PANEL  CBC    EKG EKG Interpretation  Date/Time:  Saturday March 15 2018 20:42:58 EDT Ventricular Rate:  94 PR Interval:    QRS Duration: 80 QT  Interval:  355 QTC Calculation: 444 R Axis:   96 Text Interpretation:  Sinus rhythm Anterior infarct, old Borderline repolarization abnormality Confirmed by Jacalyn Lefevre [phone]) on 03/15/2018 8:52:55 PM   Radiology Dg Foot Complete Right  Result Date: 03/15/2018 CLINICAL DATA:  Right foot pain after toe amputation a couple weeks ago. EXAM: RIGHT FOOT COMPLETE - 3+ VIEW COMPARISON:  02/11/2018 FINDINGS: Interval amputation of the third toe distal to the head of the metatarsal. Minimal associated air in the soft tissues. Small vessel atherosclerotic disease is present. There is mild degenerative change over the midfoot. There is a small inferior calcaneal spur. IMPRESSION: Postsurgical change compatible with amputation of the third toe distal to the head of the metatarsal. Air in the adjacent soft tissues which may be postsurgical although cannot exclude infection. Electronically Signed   By: Elberta Fortis M.D.   On: 03/15/2018 18:58    Procedures Procedures (including critical care time)  Medications Ordered in ED Medications  ondansetron (ZOFRAN) injection 4 mg (has no administration in time range)  amLODipine (NORVASC) tablet 5 mg (has no administration in time range)  aspirin EC tablet 81 mg (has no administration in time range)  atorvastatin (LIPITOR) tablet 20 mg (has no administration in time range)  clopidogrel (PLAVIX) tablet 75 mg (has no administration in time range)  docusate sodium (COLACE) capsule 100 mg (has no administration in time range)  feeding supplement (ENSURE ENLIVE) (ENSURE ENLIVE) liquid 237 mL (has no administration in time range)  ferrous sulfate tablet 325 mg (has no administration in time range)  folic acid (FOLVITE) tablet 1 mg (has no administration in time range)  gabapentin (NEURONTIN) tablet 600 mg (has no administration in time range)  insulin glargine (LANTUS) injection 7 Units (has no administration in time range)  levETIRAcetam (KEPPRA) tablet 500 mg  (has no administration in time range)  tiZANidine (ZANAFLEX) tablet 4 mg (has  no administration in time range)  sodium chloride 0.9 % bolus 1,500 mL (1,500 mLs Intravenous New Bag/Given 03/15/18 2108)  0.9 %  sodium chloride infusion ( Intravenous New Bag/Given 03/15/18 2108)  LORazepam (ATIVAN) injection 1 mg (has no administration in time range)  insulin aspart (novoLOG) injection 0-9 Units (has no administration in time range)  hydrALAZINE (APRESOLINE) injection 5 mg (has no administration in time range)  acetaminophen (TYLENOL) tablet 650 mg (has no administration in time range)  zolpidem (AMBIEN) tablet 5 mg (has no administration in time range)  cefTRIAXone (ROCEPHIN) 2 g in sodium chloride 0.9 % 100 mL IVPB (2 g Intravenous New Bag/Given 03/15/18 2110)  metroNIDAZOLE (FLAGYL) IVPB 500 mg (500 mg Intravenous New Bag/Given 03/15/18 2111)  heparin injection 5,000 Units (5,000 Units Subcutaneous Given 03/15/18 2107)  polyethylene glycol (MIRALAX / GLYCOLAX) packet 17 g (has no administration in time range)  vancomycin (VANCOCIN) IVPB 1000 mg/200 mL premix (has no administration in time range)  oxyCODONE-acetaminophen (PERCOCET/ROXICET) 5-325 MG per tablet 1 tablet (1 tablet Oral Given 03/15/18 2111)    And  oxyCODONE (Oxy IR/ROXICODONE) immediate release tablet 5 mg (5 mg Oral Given 03/15/18 2111)  morphine 4 MG/ML injection 4 mg (4 mg Intravenous Given 03/15/18 1834)  ondansetron (ZOFRAN) injection 4 mg (4 mg Intravenous Given 03/15/18 1834)  piperacillin-tazobactam (ZOSYN) IVPB 3.375 g (0 g Intravenous Stopped 03/15/18 1923)     Initial Impression / Assessment and Plan / ED Course  I have reviewed the triage vital signs and the nursing notes.  Pertinent labs & imaging results that were available during my care of the patient were reviewed by me and considered in my medical decision making (see chart for details).    Foot is cellulitic and wounds are not healing.  Pt given IV zosyn and d/w  hospitalists for admission.  Pt d/w Dr. Clyde Lundborg.  CRITICAL CARE Performed by: Jacalyn Lefevre   Total critical care time: 30 minutes  Critical care time was exclusive of separately billable procedures and treating other patients.  Critical care was necessary to treat or prevent imminent or life-threatening deterioration.  Critical care was time spent personally by me on the following activities: development of treatment plan with patient and/or surrogate as well as nursing, discussions with consultants, evaluation of patient's response to treatment, examination of patient, obtaining history from patient or surrogate, ordering and performing treatments and interventions, ordering and review of laboratory studies, ordering and review of radiographic studies, pulse oximetry and re-evaluation of patient's condition.  Final Clinical Impressions(s) / ED Diagnoses   Final diagnoses:  Cellulitis of right lower extremity  Poorly controlled type 2 diabetes mellitus (HCC)  PVD (peripheral vascular disease) [facility])    ED Discharge Orders    None       Jacalyn Lefevre, MD 03/15/18 1946    Jacalyn Lefevre, MD 03/15/18 2114    Jacalyn Lefevre, MD 03/25/18 1458

## 2018-03-15 NOTE — ED Notes (Signed)
X-ray at bedside

## 2018-03-16 DIAGNOSIS — L03115 Cellulitis of right lower limb: Secondary | ICD-10-CM

## 2018-03-16 LAB — URINALYSIS, ROUTINE W REFLEX MICROSCOPIC
BACTERIA UA: NONE SEEN
BILIRUBIN URINE: NEGATIVE
GLUCOSE, UA: 50 mg/dL — AB
KETONES UR: NEGATIVE mg/dL
Leukocytes, UA: NEGATIVE
NITRITE: NEGATIVE
PROTEIN: NEGATIVE mg/dL
Specific Gravity, Urine: 1.012 (ref 1.005–1.030)
Squamous Epithelial / LPF: NONE SEEN
pH: 5 (ref 5.0–8.0)

## 2018-03-16 LAB — BASIC METABOLIC PANEL
ANION GAP: 12 (ref 5–15)
BUN: 20 mg/dL (ref 6–20)
CALCIUM: 8.3 mg/dL — AB (ref 8.9–10.3)
CO2: 24 mmol/L (ref 22–32)
Chloride: 102 mmol/L (ref 101–111)
Creatinine, Ser: 1.63 mg/dL — ABNORMAL HIGH (ref 0.61–1.24)
GFR calc non Af Amer: 45 mL/min — ABNORMAL LOW (ref >60)
GFR, EST AFRICAN AMERICAN: 52 mL/min — AB (ref >60)
Glucose, Bld: 155 mg/dL — ABNORMAL HIGH (ref 65–99)
Potassium: 3.9 mmol/L (ref 3.5–5.1)
Sodium: 138 mmol/L (ref 135–145)

## 2018-03-16 LAB — CBG MONITORING, ED
GLUCOSE-CAPILLARY: 116 mg/dL — AB (ref 65–99)
GLUCOSE-CAPILLARY: 146 mg/dL — AB (ref 65–99)
Glucose-Capillary: 137 mg/dL — ABNORMAL HIGH (ref 65–99)

## 2018-03-16 LAB — LACTIC ACID, PLASMA: Lactic Acid, Venous: 1.4 mmol/L (ref 0.5–1.9)

## 2018-03-16 LAB — GLUCOSE, CAPILLARY
GLUCOSE-CAPILLARY: 133 mg/dL — AB (ref 65–99)
Glucose-Capillary: 134 mg/dL — ABNORMAL HIGH (ref 65–99)

## 2018-03-16 LAB — CREATININE, URINE, RANDOM: Creatinine, Urine: 75.35 mg/dL

## 2018-03-16 LAB — CBC
HCT: 33 % — ABNORMAL LOW (ref 39.0–52.0)
HEMOGLOBIN: 10.8 g/dL — AB (ref 13.0–17.0)
MCH: 26.5 pg (ref 26.0–34.0)
MCHC: 32.7 g/dL (ref 30.0–36.0)
MCV: 80.9 fL (ref 78.0–100.0)
Platelets: 330 10*3/uL (ref 150–400)
RBC: 4.08 MIL/uL — ABNORMAL LOW (ref 4.22–5.81)
RDW: 16 % — ABNORMAL HIGH (ref 11.5–15.5)
WBC: 19.1 10*3/uL — AB (ref 4.0–10.5)

## 2018-03-16 LAB — HIV ANTIBODY (ROUTINE TESTING W REFLEX): HIV Screen 4th Generation wRfx: NONREACTIVE

## 2018-03-16 LAB — SODIUM, URINE, RANDOM: SODIUM UR: 69 mmol/L

## 2018-03-16 MED ORDER — ATORVASTATIN CALCIUM 20 MG PO TABS
20.0000 mg | ORAL_TABLET | Freq: Every day | ORAL | Status: DC
  Administered 2018-03-16 – 2018-03-22 (×6): 20 mg via ORAL
  Filled 2018-03-16 (×8): qty 1

## 2018-03-16 MED ORDER — MORPHINE SULFATE (PF) 2 MG/ML IV SOLN
2.0000 mg | INTRAVENOUS | Status: DC | PRN
  Administered 2018-03-16 – 2018-03-21 (×10): 2 mg via INTRAVENOUS
  Filled 2018-03-16 (×12): qty 1

## 2018-03-16 NOTE — Progress Notes (Signed)
PROGRESS NOTE    Luke Manning  ZOX:[id number] DOB: [birth year] DOA: 03/15/2018 PCP: Jackie Plum, MD   Brief Narrative: 57 year old male with history of hypertension, hyperlipidemia, diabetes, coronary artery disease, iron deficiency anemia, peripheral vascular disease, status post right third toe amputation on February 2019 by Dr. Ophelia Charter presented with worsening right foot pain.  In the ER patient was found to have WBC of 21.6, elevated lactic acid level.  He started on antibiotics and admitted for further evaluation.  Assessment & Plan:   #Sepsis due to diabetic right foot wound infection: Patient had surgery of right toe about a month ago.  MRI of the foot showed cellulitis and myositis of the forefoot with soft tissue ulceration and associated subcutaneous emphysema.  For now continue broad-spectrum antibiotics with vancomycin, Rocephin and Flagyl.  Wound care referral.  Follow-up culture result.  Orthopedics consult requested and discussed with Dr.Whitfield. -Monitor leukocytosis  #Type 2 diabetes with renal manifestation: Monitor blood sugar level.  Continue current insulin regimen.  #Hypertension: Monitor blood pressure.  On amlodipine.  #Seizure disorder: Continue Keppra, Ativan as needed and seizure precaution.  #History of hyperlipidemia: Continue Lipitor.  #Peripheral vascular disease a status post angioplasty of right superficial femoral artery by vascular surgery on February 2019.  Continue aspirin Plavix.    #Acute kidney injury: Likely in the setting of sepsis.  No UTI.  Monitor BMP.  Continue IV fluid.  Repeat lab in the morning.  DVT prophylaxis: Heparin subcutaneous Code Status: Full code Family Communication: No family at bedside Disposition Plan: Likely discharge home in 2-3 days    Consultants:   Orthopedics  Procedures: None Antimicrobials: Ceftriaxone, vancomycin and Flagyl.  Subjective: Seen and examined at bedside.  Reported leg pain and asking for  pain medication.  Denied nausea vomiting chest pain or shortness of breath.  Objective: Vitals:   03/16/18 1250 03/16/18 1300 03/16/18 1301 03/16/18 1346  BP:   130/82 (!) 147/74  Pulse: (!) 116 (!) 117 (!) 115 (!) 111  Resp: 13  20 18   Temp:   (!) 101.2 F (38.4 C) 98.9 F (37.2 C)  TempSrc:    Oral  SpO2: 100% 98% 96% 100%  Weight:      Height:        Intake/Output Summary (Last 24 hours) at 03/16/2018 1407 Last data filed at 03/16/2018 0509 Gross per 24 hour  Intake 450 ml  Output -  Net 450 ml   Filed Weights   03/15/18 1753 03/15/18 1837  Weight: 52.2 kg (115 lb) 52.2 kg (115 lb)    Examination:  General exam: Appears calm and comfortable  Respiratory system: Clear to auscultation. Respiratory effort normal. No wheezing or crackle Cardiovascular system: S1 & S2 heard, RRR.  No pedal edema. Gastrointestinal system: Abdomen is nondistended, soft and nontender. Normal bowel sounds heard. Central nervous system: Alert and oriented. No focal neurological deficits. Extremities: Right foot third toe amputation with surrounding swelling and dry  scales, tender Skin: No rashes, lesions or ulcers Psychiatry: Judgement and insight appear normal. Mood & affect appropriate.     Data Reviewed: I have personally reviewed following labs and imaging studies  CBC: Recent Labs  Lab 03/15/18 1813 03/16/18 0228  WBC 21.6* 19.1*  NEUTROABS 17.7*  --   HGB 10.8* 10.8*  HCT 33.3* 33.0*  MCV 81.4 80.9  PLT 387 330   Basic Metabolic Panel: Recent Labs  Lab 03/15/18 1813 03/16/18 0228  NA 133* 138  K 3.8 3.9  CL 97* 102  CO2 24 24  GLUCOSE 244* 155*  BUN 20 20  CREATININE 1.50* 1.63*  CALCIUM 9.2 8.3*   GFR: Estimated Creatinine Clearance: 36.5 mL/min (A) (by C-G formula based on SCr of 1.63 mg/dL (H)). Liver Function Tests: Recent Labs  Lab 03/15/18 1813  AST 21  ALT 11*  ALKPHOS 79  BILITOT 0.6  PROT 8.9*  ALBUMIN 3.0*   No results for input(s): LIPASE,  AMYLASE in the last 168 hours. No results for input(s): AMMONIA in the last 168 hours. Coagulation Profile: Recent Labs  Lab 03/15/18 2130  INR 1.24   Cardiac Enzymes: No results for input(s): CKTOTAL, CKMB, CKMBINDEX, TROPONINI in the last 168 hours. BNP (last 3 results) No results for input(s): PROBNP in the last 8760 hours. HbA1C: No results for input(s): HGBA1C in the last 72 hours. CBG: Recent Labs  Lab 03/15/18 2340 03/16/18 0046 03/16/18 0740 03/16/18 1202  GLUCAP 140* 146* 137* 116*   Lipid Profile: No results for input(s): CHOL, HDL, LDLCALC, TRIG, CHOLHDL, LDLDIRECT in the last 72 hours. Thyroid Function Tests: No results for input(s): TSH, T4TOTAL, FREET4, T3FREE, THYROIDAB in the last 72 hours. Anemia Panel: No results for input(s): VITAMINB12, FOLATE, FERRITIN, TIBC, IRON, RETICCTPCT in the last 72 hours. Sepsis Labs: Recent Labs  Lab 03/15/18 1839 03/15/18 2130 03/15/18 2141 03/16/18 0021  PROCALCITON  --  0.16  --   --   LATICACIDVEN 2.15* 1.6 1.72 1.4    No results found for this or any previous visit (from the past 240 hour(s)).       Radiology Studies: Mr Foot Right Wo Contrast  Result Date: 03/15/2018 CLINICAL DATA:  Ulceration along the lateral aspect of the forefoot adjacent to the fifth metatarsal. Recent amputation of the third toe 1 month ago. Diabetes. EXAM: MRI OF THE RIGHT FOREFOOT WITHOUT CONTRAST TECHNIQUE: Multiplanar, multisequence MR imaging of the right forefoot was performed. No intravenous contrast was administered. COMPARISON:  Radiographs from the same day FINDINGS: Bones/Joint/Cartilage Maintained cortices without bone destruction or fracture. Minor reactive marrow edema of the lateral aspect of the fifth metatarsal head and neck and base of fifth metatarsal. No joint dislocation or effusions. Status post amputation of the third toe at the MTP articulation. Mild joint space narrowing, spurring and subchondral cystic change across  the talonavicular joint with minor reactive edema along the plantar aspect of the navicular. Mild cuboid reactive edema without cortical bone loss. Ligaments Negative Muscles and Tendons Myositis of the plantar muscles of the forefoot. The flexor and extensor tendons crossing the forefoot demonstrate no acute tear or significant tenosynovitis. Soft tissues Diffuse soft tissue swelling of the forefoot without focal fluid collections. Subcutaneous emphysema is noted of the forefoot. Soft tissue ulceration along the lateral aspect of the forefoot likely accounts for the emphysema. IMPRESSION: 1. Cellulitis and myositis of the forefoot with soft tissue ulceration adjacent to the lateral aspect of the fifth metatarsal head. Associated subcutaneous emphysema is seen of the forefoot. 2. Reactive marrow edema is noted without cortical bone loss involving the distal fifth metatarsal, proximal fifth phalanx and cuboid. 3. Status post amputation of the third toe. Electronically Signed   By: Tollie Ethavid  Kwon M.D.   On: 03/15/2018 23:22   Dg Foot Complete Right  Result Date: 03/15/2018 CLINICAL DATA:  Right foot pain after toe amputation a couple weeks ago. EXAM: RIGHT FOOT COMPLETE - 3+ VIEW COMPARISON:  02/11/2018 FINDINGS: Interval amputation of the third toe distal to the head of the metatarsal. Minimal associated air  in the soft tissues. Small vessel atherosclerotic disease is present. There is mild degenerative change over the midfoot. There is a small inferior calcaneal spur. IMPRESSION: Postsurgical change compatible with amputation of the third toe distal to the head of the metatarsal. Air in the adjacent soft tissues which may be postsurgical although cannot exclude infection. Electronically Signed   By: Elberta Fortis M.D.   On: 03/15/2018 18:58        Scheduled Meds: . amLODipine  5 mg Oral Daily  . aspirin EC  81 mg Oral Daily  . atorvastatin  20 mg Oral q1800  . clopidogrel  75 mg Oral Q breakfast  .  docusate sodium  100 mg Oral BID  . feeding supplement (ENSURE ENLIVE)  237 mL Oral BID BM  . ferrous sulfate  325 mg Oral BID WC  . folic acid  1 mg Oral Daily  . gabapentin  600 mg Oral BID  . heparin  5,000 Units Subcutaneous Q8H  . insulin aspart  0-9 Units Subcutaneous TID WC  . insulin glargine  7 Units Subcutaneous QHS  . levETIRAcetam  500 mg Oral BID   Continuous Infusions: . sodium chloride 100 mL/hr (03/16/18 1013)  . cefTRIAXone (ROCEPHIN)  IV Stopped (03/15/18 2210)  . metronidazole 500 mg (03/16/18 1308)  . vancomycin Stopped (03/16/18 0105)     LOS: 1 day    Dron Jaynie Collins, MD [facility] Pager [phone]  If 7PM-7AM, please contact night-coverage [email or web address] Password [facility] 03/16/2018, 2:07 PM

## 2018-03-16 NOTE — ED Notes (Signed)
Ordered diet tray for pt  

## 2018-03-16 NOTE — Progress Notes (Signed)
[facility]. A&O. VSS. All questions/concerns addressed. Call bell within reach. Will continue to monitor.

## 2018-03-16 NOTE — Plan of Care (Signed)
Progressing

## 2018-03-16 NOTE — Clinical Social Work Note (Signed)
CSW received inappropriate consult for "Access Meds for Discharge." Please consult care management for this need. CSW signing off as no further Social Work intervention needed. Please reconsult if new Social Work needs arise.   Corlis HoveJeneya Verble Styron, Columbus[place]CSWA, LCASA Weekend Coverage CSW  [phone]

## 2018-03-17 ENCOUNTER — Encounter: Payer: Self-pay | Admitting: *Deleted

## 2018-03-17 DIAGNOSIS — M609 Myositis, unspecified: Secondary | ICD-10-CM

## 2018-03-17 DIAGNOSIS — I7389 Other specified peripheral vascular diseases: Secondary | ICD-10-CM

## 2018-03-17 DIAGNOSIS — I2510 Atherosclerotic heart disease of native coronary artery without angina pectoris: Secondary | ICD-10-CM

## 2018-03-17 DIAGNOSIS — E11621 Type 2 diabetes mellitus with foot ulcer: Secondary | ICD-10-CM

## 2018-03-17 DIAGNOSIS — L97515 Non-pressure chronic ulcer of other part of right foot with muscle involvement without evidence of necrosis: Secondary | ICD-10-CM

## 2018-03-17 DIAGNOSIS — Z89421 Acquired absence of other right toe(s): Secondary | ICD-10-CM

## 2018-03-17 DIAGNOSIS — E11628 Type 2 diabetes mellitus with other skin complications: Secondary | ICD-10-CM

## 2018-03-17 DIAGNOSIS — L03031 Cellulitis of right toe: Secondary | ICD-10-CM

## 2018-03-17 DIAGNOSIS — Z9861 Coronary angioplasty status: Secondary | ICD-10-CM

## 2018-03-17 DIAGNOSIS — L089 Local infection of the skin and subcutaneous tissue, unspecified: Secondary | ICD-10-CM

## 2018-03-17 DIAGNOSIS — Z7902 Long term (current) use of antithrombotics/antiplatelets: Secondary | ICD-10-CM

## 2018-03-17 LAB — CBC
HCT: 26.7 % — ABNORMAL LOW (ref 39.0–52.0)
Hemoglobin: 8.9 g/dL — ABNORMAL LOW (ref 13.0–17.0)
MCH: 26.6 pg (ref 26.0–34.0)
MCHC: 33.3 g/dL (ref 30.0–36.0)
MCV: 79.9 fL (ref 78.0–100.0)
PLATELETS: 342 10*3/uL (ref 150–400)
RBC: 3.34 MIL/uL — ABNORMAL LOW (ref 4.22–5.81)
RDW: 15.8 % — AB (ref 11.5–15.5)
WBC: 17.8 10*3/uL — ABNORMAL HIGH (ref 4.0–10.5)

## 2018-03-17 LAB — BASIC METABOLIC PANEL
Anion gap: 10 (ref 5–15)
BUN: 11 mg/dL (ref 6–20)
CO2: 20 mmol/L — ABNORMAL LOW (ref 22–32)
CREATININE: 1.16 mg/dL (ref 0.61–1.24)
Calcium: 7.9 mg/dL — ABNORMAL LOW (ref 8.9–10.3)
Chloride: 105 mmol/L (ref 101–111)
GFR calc Af Amer: >60 mL/min (ref >60)
GLUCOSE: 119 mg/dL — AB (ref 65–99)
Potassium: 3.2 mmol/L — ABNORMAL LOW (ref 3.5–5.1)
SODIUM: 135 mmol/L (ref 135–145)

## 2018-03-17 LAB — GLUCOSE, CAPILLARY
GLUCOSE-CAPILLARY: 116 mg/dL — AB (ref 65–99)
Glucose-Capillary: 103 mg/dL — ABNORMAL HIGH (ref 65–99)
Glucose-Capillary: 102 mg/dL — ABNORMAL HIGH (ref 65–99)
Glucose-Capillary: 67 mg/dL (ref 65–99)
Glucose-Capillary: 103 mg/dL — ABNORMAL HIGH (ref 65–99)

## 2018-03-17 MED ORDER — POLYETHYLENE GLYCOL 3350 17 G PO PACK
17.0000 g | PACK | Freq: Every day | ORAL | Status: DC
  Administered 2018-03-17 – 2018-03-22 (×5): 17 g via ORAL
  Filled 2018-03-17 (×5): qty 1

## 2018-03-17 MED ORDER — OXYCODONE HCL 5 MG PO TABS
10.0000 mg | ORAL_TABLET | ORAL | Status: DC | PRN
  Administered 2018-03-17 – 2018-03-18 (×5): 10 mg via ORAL
  Filled 2018-03-17 (×5): qty 2

## 2018-03-17 MED ORDER — POTASSIUM CHLORIDE CRYS ER 20 MEQ PO TBCR
40.0000 meq | EXTENDED_RELEASE_TABLET | Freq: Two times a day (BID) | ORAL | Status: DC
  Filled 2018-03-17: qty 2

## 2018-03-17 MED ORDER — PROMETHAZINE HCL 25 MG/ML IJ SOLN
6.2500 mg | Freq: Four times a day (QID) | INTRAMUSCULAR | Status: DC | PRN
  Administered 2018-03-17: 6.25 mg via INTRAVENOUS
  Filled 2018-03-17: qty 1

## 2018-03-17 MED ORDER — ADULT MULTIVITAMIN W/MINERALS CH
1.0000 | ORAL_TABLET | Freq: Every day | ORAL | Status: DC
  Administered 2018-03-17 – 2018-03-23 (×7): 1 via ORAL
  Filled 2018-03-17 (×7): qty 1

## 2018-03-17 MED ORDER — PREMIER PROTEIN SHAKE
11.0000 [oz_av] | Freq: Two times a day (BID) | ORAL | Status: DC
  Administered 2018-03-18 – 2018-03-23 (×8): 11 [oz_av] via ORAL
  Filled 2018-03-17 (×16): qty 325.31

## 2018-03-17 MED ORDER — POTASSIUM CHLORIDE 10 MEQ/100ML IV SOLN
10.0000 meq | INTRAVENOUS | Status: AC
  Administered 2018-03-17 (×3): 10 meq via INTRAVENOUS
  Filled 2018-03-17 (×3): qty 100

## 2018-03-17 NOTE — Consult Note (Signed)
[facility]    Date of Admission:  03/15/2018           Day 3 vancomycin        Day 3 ceftriaxone        Day 3 metronidazole       Reason for Consult: Right diabetic foot infection    Referring Provider: Dr. Crista Elliot  Assessment: He has persistent diabetic foot infection with new ulceration laterally on his right foot.  His admission note indicates that pus was expressible from the operative site where his right third toe was recently amputated.  He probably has very early osteomyelitis of the fifth metatarsal as well.  I will treat him with cefepime and metronidazole for now.  Plan: 1. Consolidate therapy to cefepime and metronidazole 2. We will follow with you  Principal Problem:   Diabetic foot infection (HCC) Active Problems:   Type II diabetes mellitus with renal manifestations (HCC)   HTN (hypertension)   Seizure disorder (HCC)   Wound infection   HLD (hyperlipidemia)   PVD (peripheral vascular disease) (HCC)   CAD (coronary artery disease)   Iron deficiency anemia   Acute renal failure superimposed on stage 2 chronic kidney disease (HCC)   Sepsis (HCC)   Cellulitis of right lower extremity   . amLODipine  5 mg Oral Daily  . aspirin EC  81 mg Oral Daily  . atorvastatin  20 mg Oral q1800  . clopidogrel  75 mg Oral Q breakfast  . docusate sodium  100 mg Oral BID  . feeding supplement (ENSURE ENLIVE)  237 mL Oral BID BM  . ferrous sulfate  325 mg Oral BID WC  . folic acid  1 mg Oral Daily  . gabapentin  600 mg Oral BID  . heparin  5,000 Units Subcutaneous Q8H  . insulin aspart  0-9 Units Subcutaneous TID WC  . insulin glargine  7 Units Subcutaneous QHS  . levETIRAcetam  500 mg Oral BID  . polyethylene glycol  17 g Oral Daily    HPI: Luke Manning is a 57 y.o. male admitted 03/15/2018 with right foot pain.   He recently on 02/17/18 had amputation of the right #3 toe by Dr. Ophelia Charter after he was found to have ischemic foot pain  and gangrenous toe. After he underwent amputation he was sent home on 7 days keflex and flagyl PO however he did not take because he could not afford these medications. He saw his podiatrist on 03/03/18 and at that time his incision appeared unremarkable between 2nd anf 4th toe however he had macerated open ulcer at the 5th MPJ area measuring 1.5 x 1.5 cm and was warm to the touch. As an outpatient he is working with vascular surgery - R ABI 0.46 >> s/p balloon angioplasty to the right superficial femoral artery on 02/14/18 and is on plavix.   MRI of his right foot - cellulitis and myositis of forefoot with soft tissue ulceration adjacent to lateral area of the 5th metatarsal head with associated subcutaneous emphysema; reactive bone marrow edema with cortical bone loss. Since admission he has been febrile to 102.3.   Lab Results  Component Value Date   HGBA1C 11.4 (H) 02/12/2018      Review of Systems: ROS  Past Medical History:  Diagnosis Date  . Diabetes mellitus without complication (HCC)   . MI, old     Social History   Tobacco Use  .  Smoking status: Never Smoker  . Smokeless tobacco: Former Engineer, waterUser  Substance Use Topics  . Alcohol use: No    Frequency: Never  . Drug use: No    Family History  Problem Relation Age of Onset  . Cancer Father   . Diabetes Neg Hx    No Known Allergies  OBJECTIVE: Blood pressure (!) 136/57, pulse (!) 103, temperature 99.3 F (37.4 C), temperature source Oral, resp. rate 20, height 5\' 2"  (1.575 m), weight 115 lb (52.2 kg), SpO2 100 %.  Physical Exam  Constitutional: He is oriented to person, place, and time.  He is resting quietly in bed.  He states that he is in pain.  Musculoskeletal:  His right foot is warm to touch.  His right third toe is surgically absent.  Sutures remain in place from his surgery 1 month ago.  He has ulceration over his right foot laterally adjacent to the fifth metatarsal head.  There is exposed tendon and possibly  bone.  There is surrounding eschar.  Neurological: He is alert and oriented to person, place, and time.  Skin: No rash noted.  Psychiatric: Mood and affect normal.    Lab Results Lab Results  Component Value Date   WBC 17.8 (H) 03/17/2018   HGB 8.9 (L) 03/17/2018   HCT 26.7 (L) 03/17/2018   MCV 79.9 03/17/2018   PLT 342 03/17/2018    Lab Results  Component Value Date   CREATININE 1.16 03/17/2018   BUN 11 03/17/2018   NA 135 03/17/2018   K 3.2 (L) 03/17/2018   CL 105 03/17/2018   CO2 20 (L) 03/17/2018    Lab Results  Component Value Date   ALT 11 (L) 03/15/2018   AST 21 03/15/2018   ALKPHOS 79 03/15/2018   BILITOT 0.6 03/15/2018     Microbiology: Recent Results (from the past 240 hour(s))  Culture, blood (routine x 2)     Status: None (Preliminary result)   Collection Time: 03/15/18  6:13 PM  Result Value Ref Range Status   Specimen Description BLOOD BLOOD RIGHT FOREARM  Final   Special Requests   Final    BOTTLES DRAWN AEROBIC AND ANAEROBIC Blood Culture adequate volume   Culture   Final    NO GROWTH 2 DAYS Performed at [facility], [address] [place], [place] [address]    Report Status PENDING  Incomplete  Culture, blood (routine x 2)     Status: None (Preliminary result)   Collection Time: 03/15/18  6:54 PM  Result Value Ref Range Status   Specimen Description BLOOD LEFT ANTECUBITAL  Final   Special Requests   Final    BOTTLES DRAWN AEROBIC AND ANAEROBIC Blood Culture results may not be optimal due to an excessive volume of blood received in culture bottles   Culture   Final    NO GROWTH 2 DAYS Performed at [facility], [address] [place], [place] [address]    Report Status PENDING  Incomplete   Right foot MRI 03/15/2018  IMPRESSION: 1. Cellulitis and myositis of the forefoot with soft tissue ulceration adjacent to the lateral aspect of the fifth metatarsal head. Associated subcutaneous emphysema is seen of the forefoot. 2.  Reactive marrow edema is noted without cortical bone loss involving the distal fifth metatarsal, proximal fifth phalanx and cuboid. 3. Status post amputation of the third toe.   Electronically Signed   By: Tollie Ethavid  Kwon M.D.   On: 03/15/2018 23:22  Rexene AlbertsStephanie Dixon,  MSN, NP-C [facility] Cell: [phone] Pager: [phone]  03/17/2018 1:40 PM

## 2018-03-17 NOTE — Consult Note (Addendum)
   [facility]   03/17/2018  Rob Hickmanlvin Whittaker 07/15/1961 [id number]    [facility] due to recent hospital readmission.   Spoke with [name] at bedside to discuss and offer [facility] program services.   [name] is agreeable to [facility] and written consent obtained. [facility] folder provided.   Confirmed patient Primary Care Provider is Dr. Julio Sickssei-Bonsu.  [name] endorses he has HTA insurance. Confirmed best telephone number to contact patient is [phone]. His wife Vincent PeyerCathy Algeo cell number is [phone]. Lynden AngCathy is on [facility] consent. [name] reports he has issues with transportation to MD appointments. States "we just have one car and my wife has a lot of appointments too".  States he does not qualify for Medicaid.   Made Mr.Lance aware that [facility] will not interfere or replace services provided by home health.   Discussed [facility] follow up by [facility] RNCM for transition of care and [facility] LCSW for transportation issues.   [name] has a medical history of hypertension, hyperlipidemia, diabetes mellitus, CAD, iron deficiency anemia, CKD-2, seizure, PVD, and toe amputation.  Will make inpatient RNCM aware [facility] will follow.  Will request to be assigned to [facility] for transition of care and [facility] LCSW for transportation needs.    Raiford NobleAtika Neal Trulson, MSN-Ed, RN,BSN [facility] Care Management Hospital Liaison [phone]

## 2018-03-17 NOTE — Consult Note (Addendum)
Reason for Consult:Foot infection Referring Physician: [name] is an 57 y.o. male.  HPI: Luke Manning was admitted Saturday with sepsis 2/2 right foot infection. He has had intermittent pain in it for some time. He had an elevated WBC and lactate on admission. 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 Lorin Mercy.  Past Medical History:  Diagnosis Date  . Diabetes mellitus without complication ([place])   . MI, old     Past Surgical History:  Procedure Laterality Date  . ABDOMINAL AORTOGRAM W/LOWER EXTREMITY N/A 02/14/2018   Procedure: ABDOMINAL AORTOGRAM W/LOWER EXTREMITY;  Surgeon: Elam Dutch, MD;  Location: [facility];  Service: Cardiovascular;  Laterality: N/A;  . AMPUTATION Right 02/17/2018   Procedure: AMPUTATION, RIGHT 3RD TOE;  Surgeon: Marybelle Killings, MD;  Location: [facility];  Service: Orthopedics;  Laterality: Right;  . PERIPHERAL VASCULAR BALLOON ANGIOPLASTY Right 02/14/2018   Procedure: PERIPHERAL VASCULAR BALLOON ANGIOPLASTY;  Surgeon: Elam Dutch, MD;  Location: [facility];  Service: Cardiovascular;  Laterality: Right;  sfa    Family History  Problem Relation Age of Onset  . Cancer Father   . Diabetes Neg Hx     Social History:  reports that he has never smoked. He has quit using smokeless tobacco. He reports that he does not drink alcohol or use drugs.  Allergies: No Known Allergies  Medications: I have reviewed the patient's current medications.  Results for orders placed or performed during the hospital encounter of 03/15/18 (from the past 48 hour(s))  Comprehensive metabolic panel     Status: Abnormal   Collection Time: 03/15/18  6:13 PM  Result Value Ref Range   Sodium 133 (L) 135 - 145 mmol/L   Potassium 3.8 3.5 - 5.1 mmol/L   Chloride 97 (L) 101 - 111 mmol/L   CO2 24 22 - 32 mmol/L   Glucose, Bld 244 (H) 65 - 99 mg/dL   BUN 20 6 - 20 mg/dL   Creatinine, Ser 1.50 (H) 0.61 - 1.24 mg/dL   Calcium 9.2 8.9 - 10.3 mg/dL   Total  Protein 8.9 (H) 6.5 - 8.1 g/dL   Albumin 3.0 (L) 3.5 - 5.0 g/dL   AST 21 15 - 41 U/L   ALT 11 (L) 17 - 63 U/L   Alkaline Phosphatase 79 38 - 126 U/L   Total Bilirubin 0.6 0.3 - 1.2 mg/dL   GFR calc non Af Amer 50 (L) >60 mL/min   GFR calc Af Amer 58 (L) >60 mL/min    Comment: (NOTE) The eGFR has been calculated using the CKD EPI equation. This calculation has not been validated in all clinical situations. eGFR's persistently <60 mL/min signify possible Chronic Kidney Disease.    Anion gap 12 5 - 15    Comment: Performed at [facility] [address] [place], [place] [address]  CBC with Differential     Status: Abnormal   Collection Time: 03/15/18  6:13 PM  Result Value Ref Range   WBC 21.6 (H) 4.0 - 10.5 K/uL   RBC 4.09 (L) 4.22 - 5.81 MIL/uL   Hemoglobin 10.8 (L) 13.0 - 17.0 g/dL   HCT 33.3 (L) 39.0 - 52.0 %   MCV 81.4 78.0 - 100.0 fL   MCH 26.4 26.0 - 34.0 pg   MCHC 32.4 30.0 - 36.0 g/dL   RDW 16.0 (H) 11.5 - 15.5 %   Platelets 387 150 - 400 K/uL   Neutrophils Relative %  82 %   Neutro Abs 17.7 (H) 1.7 - 7.7 K/uL   Lymphocytes Relative 13 %   Lymphs Abs 2.7 0.7 - 4.0 K/uL   Monocytes Relative 5 %   Monocytes Absolute 1.0 0.1 - 1.0 K/uL   Eosinophils Relative 0 %   Eosinophils Absolute 0.1 0.0 - 0.7 K/uL   Basophils Relative 0 %   Basophils Absolute 0.0 0.0 - 0.1 K/uL    Comment: Performed at [facility] [address], [place], [place] [address]  Culture, blood (routine x 2)     Status: None (Preliminary result)   Collection Time: 03/15/18  6:13 PM  Result Value Ref Range   Specimen Description BLOOD BLOOD RIGHT FOREARM    Special Requests      BOTTLES DRAWN AEROBIC AND ANAEROBIC Blood Culture adequate volume   Culture      NO GROWTH < 24 HOURS Performed at [facility], [place] [address], [place], [place] [address]    Report Status PENDING   I-Stat CG4 Lactic Acid, ED     Status: Abnormal   Collection Time: 03/15/18  6:39 PM  Result Value Ref  Range   Lactic Acid, Venous 2.15 (HH) 0.5 - 1.9 mmol/L   Comment NOTIFIED PHYSICIAN   Culture, blood (routine x 2)     Status: None (Preliminary result)   Collection Time: 03/15/18  6:54 PM  Result Value Ref Range   Specimen Description BLOOD LEFT ANTECUBITAL    Special Requests      BOTTLES DRAWN AEROBIC AND ANAEROBIC Blood Culture results may not be optimal due to an excessive volume of blood received in culture bottles   Culture      NO GROWTH < 24 HOURS Performed at [facility] [address], [place], [place] [address]    Report Status PENDING   Lactic acid, plasma     Status: None   Collection Time: 03/15/18  9:30 PM  Result Value Ref Range   Lactic Acid, Venous 1.6 0.5 - 1.9 mmol/L    Comment: Performed at [facility] [address], [place],  [address]  Procalcitonin     Status: None   Collection Time: 03/15/18  9:30 PM  Result Value Ref Range   Procalcitonin 0.16 ng/mL    Comment:        Interpretation: PCT (Procalcitonin) <= 0.5 ng/mL: Systemic infection (sepsis) is not likely. Local bacterial infection is possible. (NOTE)       Sepsis PCT Algorithm           Lower Respiratory Tract                                      Infection PCT Algorithm    ----------------------------     ----------------------------         PCT < 0.25 ng/mL                PCT < 0.10 ng/mL         Strongly encourage             Strongly discourage   discontinuation of antibiotics    initiation of antibiotics    ----------------------------     -----------------------------       PCT 0.25 - 0.50 ng/mL            PCT 0.10 - 0.25 ng/mL  OR       >80% decrease in PCT            Discourage initiation of                                            antibiotics      Encourage discontinuation           of antibiotics    ----------------------------     -----------------------------         PCT >= 0.50 ng/mL              PCT 0.26 - 0.50 ng/mL               AND         <80% decrease in PCT             Encourage initiation of                                             antibiotics       Encourage continuation           of antibiotics    ----------------------------     -----------------------------        PCT >= 0.50 ng/mL                  PCT > 0.50 ng/mL               AND         increase in PCT                  Strongly encourage                                      initiation of antibiotics    Strongly encourage escalation           of antibiotics                                     -----------------------------                                           PCT <= 0.25 ng/mL                                                 OR                                        > 80% decrease in PCT                                     Discontinue / Do not initiate  antibiotics Performed at [facility], [place] [address], [place], [place] [address]   Protime-INR     Status: Abnormal   Collection Time: 03/15/18  9:30 PM  Result Value Ref Range   Prothrombin Time 15.5 (H) 11.4 - 15.2 seconds   INR 1.24     Comment: Performed at [facility] [address] [place], [place] [address]  HIV antibody     Status: None   Collection Time: 03/15/18  9:30 PM  Result Value Ref Range   HIV Screen 4th Generation wRfx Non Reactive Non Reactive    Comment: (NOTE) Performed At: [facility] [place], [place] [id number] Rush Farmer MD NO:[id number] Performed at [facility], [place] [address] [place], [place] [address]   Sedimentation rate     Status: Abnormal   Collection Time: 03/15/18  9:30 PM  Result Value Ref Range   Sed Rate 127 (H) 0 - 16 mm/hr    Comment: Performed at [facility] [address] [place], [place] [address]  C-reactive protein     Status: Abnormal   Collection Time: 03/15/18  9:30 PM  Result Value Ref Range   CRP 21.9 (H) <1.0 mg/dL    Comment: Performed  at [facility] [address], [place], [place] [address]  I-Stat CG4 Lactic Acid, ED     Status: None   Collection Time: 03/15/18  9:41 PM  Result Value Ref Range   Lactic Acid, Venous 1.72 0.5 - 1.9 mmol/L  CBG monitoring, ED     Status: Abnormal   Collection Time: 03/15/18 11:40 PM  Result Value Ref Range   Glucose-Capillary 140 (H) 65 - 99 mg/dL  Lactic acid, plasma     Status: None   Collection Time: 03/16/18 12:21 AM  Result Value Ref Range   Lactic Acid, Venous 1.4 0.5 - 1.9 mmol/L    Comment: Performed at [facility], [place] [address] [place], [place] [address]  CBG monitoring, ED     Status: Abnormal   Collection Time: 03/16/18 12:46 AM  Result Value Ref Range   Glucose-Capillary 146 (H) 65 - 99 mg/dL  Basic metabolic panel     Status: Abnormal   Collection Time: 03/16/18  2:28 AM  Result Value Ref Range   Sodium 138 135 - 145 mmol/L   Potassium 3.9 3.5 - 5.1 mmol/L   Chloride 102 101 - 111 mmol/L   CO2 24 22 - 32 mmol/L   Glucose, Bld 155 (H) 65 - 99 mg/dL   BUN 20 6 - 20 mg/dL   Creatinine, Ser 1.63 (H) 0.61 - 1.24 mg/dL   Calcium 8.3 (L) 8.9 - 10.3 mg/dL   GFR calc non Af Amer 45 (L) >60 mL/min   GFR calc Af Amer 52 (L) >60 mL/min    Comment: (NOTE) The eGFR has been calculated using the CKD EPI equation. This calculation has not been validated in all clinical situations. eGFR's persistently <60 mL/min signify possible Chronic Kidney Disease.    Anion gap 12 5 - 15    Comment: Performed at [facility] [address] [place] [address]  CBC     Status: Abnormal   Collection Time: 03/16/18  2:28 AM  Result Value Ref Range   WBC 19.1 (H) 4.0 - 10.5 K/uL   RBC 4.08 (L) 4.22 - 5.81 MIL/uL   Hemoglobin 10.8 (L) 13.0 - 17.0 g/dL  HCT 33.0 (L) 39.0 - 52.0 %   MCV 80.9 78.0 - 100.0 fL   MCH 26.5 26.0 - 34.0 pg   MCHC 32.7 30.0 - 36.0 g/dL   RDW 16.0 (H) 11.5 - 15.5 %   Platelets 330 150 - 400 K/uL    Comment: Performed at [facility] [address] [place], [place] [address]  CBG monitoring, ED     Status: Abnormal   Collection Time: 03/16/18  7:40 AM  Result Value Ref Range   Glucose-Capillary 137 (H) 65 - 99 mg/dL  CBG monitoring, ED     Status: Abnormal   Collection Time: 03/16/18 12:02 PM  Result Value Ref Range   Glucose-Capillary 116 (H) 65 - 99 mg/dL   Comment 1 Notify RN    Comment 2 Document in Chart   Glucose, capillary     Status: Abnormal   Collection Time: 03/16/18  5:34 PM  Result Value Ref Range   Glucose-Capillary 133 (H) 65 - 99 mg/dL  Glucose, capillary     Status: Abnormal   Collection Time: 03/16/18  9:30 PM  Result Value Ref Range   Glucose-Capillary 134 (H) 65 - 99 mg/dL  CBC     Status: Abnormal   Collection Time: 03/17/18  6:55 AM  Result Value Ref Range   WBC 17.8 (H) 4.0 - 10.5 K/uL   RBC 3.34 (L) 4.22 - 5.81 MIL/uL   Hemoglobin 8.9 (L) 13.0 - 17.0 g/dL   HCT 26.7 (L) 39.0 - 52.0 %   MCV 79.9 78.0 - 100.0 fL   MCH 26.6 26.0 - 34.0 pg   MCHC 33.3 30.0 - 36.0 g/dL   RDW 15.8 (H) 11.5 - 15.5 %   Platelets 342 150 - 400 K/uL    Comment: Performed at [facility], [address], [place], [place] [address]  Basic metabolic panel     Status: Abnormal   Collection Time: 03/17/18  6:55 AM  Result Value Ref Range   Sodium 135 135 - 145 mmol/L   Potassium 3.2 (L) 3.5 - 5.1 mmol/L   Chloride 105 101 - 111 mmol/L   CO2 20 (L) 22 - 32 mmol/L   Glucose, Bld 119 (H) 65 - 99 mg/dL   BUN 11 6 - 20 mg/dL   Creatinine, Ser 1.16 0.61 - 1.24 mg/dL   Calcium 7.9 (L) 8.9 - 10.3 mg/dL   GFR calc non Af Amer >60 >60 mL/min   GFR calc Af Amer >60 >60 mL/min    Comment: (NOTE) The eGFR has been calculated using the CKD EPI equation. This calculation has not been validated in all clinical situations. eGFR's persistently <60 mL/min signify possible Chronic Kidney Disease.    Anion gap 10 5 - 15    Comment: Performed at [facility] [address], [place], [place]  [address]  Glucose, capillary     Status: Abnormal   Collection Time: 03/17/18  8:30 AM  Result Value Ref Range   Glucose-Capillary 116 (H) 65 - 99 mg/dL    Mr Foot Right Wo Contrast  Result Date: 03/15/2018 CLINICAL DATA:  Ulceration along the lateral aspect of the forefoot adjacent to the fifth metatarsal. Recent amputation of the third toe 1 month ago. Diabetes. EXAM: MRI OF THE RIGHT FOREFOOT WITHOUT CONTRAST TECHNIQUE: Multiplanar, multisequence MR imaging of the right forefoot was performed. No intravenous contrast was administered. COMPARISON:  Radiographs from the same day FINDINGS: Bones/Joint/Cartilage Maintained cortices without bone  destruction or fracture. Minor reactive marrow edema of the lateral aspect of the fifth metatarsal head and neck and base of fifth metatarsal. No joint dislocation or effusions. Status post amputation of the third toe at the MTP articulation. Mild joint space narrowing, spurring and subchondral cystic change across the talonavicular joint with minor reactive edema along the plantar aspect of the navicular. Mild cuboid reactive edema without cortical bone loss. Ligaments Negative Muscles and Tendons Myositis of the plantar muscles of the forefoot. The flexor and extensor tendons crossing the forefoot demonstrate no acute tear or significant tenosynovitis. Soft tissues Diffuse soft tissue swelling of the forefoot without focal fluid collections. Subcutaneous emphysema is noted of the forefoot. Soft tissue ulceration along the lateral aspect of the forefoot likely accounts for the emphysema. IMPRESSION: 1. Cellulitis and myositis of the forefoot with soft tissue ulceration adjacent to the lateral aspect of the fifth metatarsal head. Associated subcutaneous emphysema is seen of the forefoot. 2. Reactive marrow edema is noted without cortical bone loss involving the distal fifth metatarsal, proximal fifth phalanx and cuboid. 3. Status post amputation of the third toe.  Electronically Signed   By: Ashley Royalty M.Luke.   On: 03/15/2018 23:22   Dg Foot Complete Right  Result Date: 03/15/2018 CLINICAL DATA:  Right foot pain after toe amputation a couple weeks ago. EXAM: RIGHT FOOT COMPLETE - 3+ VIEW COMPARISON:  02/11/2018 FINDINGS: Interval amputation of the third toe distal to the head of the metatarsal. Minimal associated air in the soft tissues. Small vessel atherosclerotic disease is present. There is mild degenerative change over the midfoot. There is a small inferior calcaneal spur. IMPRESSION: Postsurgical change compatible with amputation of the third toe distal to the head of the metatarsal. Air in the adjacent soft tissues which may be postsurgical although cannot exclude infection. Electronically Signed   By: Marin Olp M.Luke.   On: 03/15/2018 18:58    Review of Systems  Constitutional: Negative for weight loss.  HENT: Negative for ear discharge, ear pain, hearing loss and tinnitus.   Eyes: Negative for blurred vision, double vision, photophobia and pain.  Respiratory: Negative for cough, sputum production and shortness of breath.   Cardiovascular: Negative for chest pain.  Gastrointestinal: Negative for abdominal pain, nausea and vomiting.  Genitourinary: Negative for dysuria, flank pain, frequency and urgency.  Musculoskeletal: Positive for joint pain (Right foot). Negative for back pain, falls, myalgias and neck pain.  Neurological: Negative for dizziness, tingling, sensory change, focal weakness, loss of consciousness and headaches.  Endo/Heme/Allergies: Does not bruise/bleed easily.  Psychiatric/Behavioral: Negative for depression, memory loss and substance abuse. The patient is not nervous/anxious.    Blood pressure (!) 146/90, pulse (!) 109, temperature 98.7 F (37.1 C), temperature source Oral, resp. rate 18, height 5' 2"  (1.575 m), weight 52.2 kg (115 lb), SpO2 98 %. Physical Exam  Constitutional: He appears well-developed and well-nourished.  No distress.  HENT:  Head: Normocephalic and atraumatic.  Eyes: Conjunctivae are normal. Right eye exhibits no discharge. Left eye exhibits no discharge. No scleral icterus.  Neck: Normal range of motion.  Cardiovascular: Normal rate and regular rhythm.  Respiratory: Effort normal. No respiratory distress.  Musculoskeletal:  LLE No traumatic wounds, ecchymosis, or rash  Nontender, forefoot erythema, amputation site WNL, ulceration on lateral forefoot, no fluctuance  No knee or ankle effusion  Knee stable to varus/ valgus and anterior/posterior stress  Sens DPN, SPN, TN intact  Motor EHL, ext, flex, evers 5/5  DP 1+, PT 1+,  2+ NP edema  Neurological: He is alert.  Skin: Skin is warm and dry. He is not diaphoretic.  Psychiatric: He has a normal mood and affect. His behavior is normal.    Assessment/Plan: Diabetic foot ulcer with cellulitis -- Nothing to drain according to MRI. Agree with ID consult. If he doesn't turn around with abx then may need more aggressive amputation. Dr. Lorin Mercy will evaluate this afternoon and give more definitive plan. CPM for now.   Lorin Mercy note - pt well known to me . Labs, MRI, xrays reviewed. No obvious osteo. 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 Had SFA balloon tx last month before amputation. Has lateral foot necrosis of skin and gas in soft tissue with myositis. Poor arterial supply . He states he needs a new insulin monitor since his does not work. He did not return to see me after surgery. Was seen by podiatry and placed on ABX soaks and dressing changes.  Discussed with him that BKA is best options. At this time he does not have osteomyelitis. We tried to reach his wife by phone to discuss with her. She recently was in hospital as a patient also and was recently discharged. He will try to reach her to have her present in AM  7: 30 AM so we can discuss surgery options. Not likely  transmet would heal. Will see him in AM to discuss further. Continue ABX, if he agrees to surgery could do Wednesday afternoon.   My cell [phone].    I left wife voicemail while at bedside talking with pt about options.   Lisette Abu, PA-C Orthopedic Surgery [phone] 03/17/2018, 11:34 AM

## 2018-03-17 NOTE — Progress Notes (Addendum)
PROGRESS NOTE    Luke Manning  ZOX:[id number] DOB: 08/17/1961 DOA: 03/15/2018 PCP: Jackie Plumsei-Bonsu, George, MD   Brief Narrative: 57 year old male with history of hypertension, hyperlipidemia, diabetes, coronary artery disease, iron deficiency anemia, peripheral vascular disease, status post right third toe amputation on February 2019 by Dr. Ophelia CharterYates presented with worsening right foot pain.  In the ER patient was found to have WBC of 21.6, elevated lactic acid level.  He started on antibiotics and admitted for further evaluation.  Assessment & Plan:   #Sepsis due to diabetic right foot wound infection: Patient had surgery of right toe about a month ago.  MRI of the foot showed cellulitis and myositis of the forefoot with soft tissue ulceration and associated subcutaneous emphysema.  Orthopedics was reconsulted today for their evaluation.  For now continue broad-spectrum antibiotics with vancomycin, Rocephin and Flagyl.  Wound care referral.  Follow-up culture result.   -Patient had temperature last night and leukocytosis trending down.  Continue temperature curve and blood counts.  Further and definitive treatment deferred to orthopedics. -ID consult requested to Dr. Orvan Falconerampbell.  #Pain management: Patient reported the pain is not controlled with current oral medication.  The dose of oxycodone increase to 10 mg.  On IV morphine as needed for breakthrough pain management.  MiraLAX as a stool softener.  #Type 2 diabetes with renal manifestation: Monitor blood sugar level.  Continue current insulin regimen.  #Hypertension: Monitor blood pressure.  On amlodipine.  #Hypokalemia: Replete potassium chloride.  Repeat lab in the morning.  Check magnesium level.  #Seizure disorder: Continue Keppra, Ativan as needed and seizure precaution.  #History of hyperlipidemia: Continue Lipitor.  #Peripheral vascular disease a status post angioplasty of right superficial femoral artery by vascular surgery on February  2019.  Continue aspirin Plavix.    #Acute kidney injury: Likely in the setting of sepsis.  No UTI.  Serum creatinine level improved.  Reduce the rate of IV fluid.  Encourage oral intake.  DVT prophylaxis: Heparin subcutaneous Code Status: Full code Family Communication: No family at bedside Disposition Plan: Likely discharge home in 2-3 days    Consultants:   Orthopedics  Procedures: None Antimicrobials: Ceftriaxone, vancomycin and Flagyl.  Subjective: Seen and examined at bedside.  Denied headache, dizziness, nausea vomiting chest pain.  Reported the foot pain is still there and asking for pain medication.  Objective: Vitals:   03/16/18 1346 03/16/18 2133 03/17/18 0428 03/17/18 0621  BP: (!) 147/74 (!) 147/82 (!) 146/90   Pulse: (!) 111 (!) 110 (!) 109   Resp: 18 18 18    Temp: 98.9 F (37.2 C) 98.1 F (36.7 C) (!) 102.3 F (39.1 C) 98.7 F (37.1 C)  TempSrc: Oral  Oral Oral  SpO2: 100% 99% 98%   Weight:      Height:        Intake/Output Summary (Last 24 hours) at 03/17/2018 1101 Last data filed at 03/17/2018 0536 Gross per 24 hour  Intake 1940 ml  Output -  Net 1940 ml   Filed Weights   03/15/18 1753 03/15/18 1837  Weight: 52.2 kg (115 lb) 52.2 kg (115 lb)    Examination:  General exam: Not in distress Respiratory system: Clear bilateral respiratory effort normal.  Cardiovascular system: S1 & S2 heard, RRR.  No pedal edema. Gastrointestinal system: Abdomen is nondistended, soft and nontender. Normal bowel sounds heard. Central nervous system: Alert awake and following commands. Extremities: Right foot third toe amputation with surrounding swelling and dry  scales, tender, exam unchanged Skin: No  rashes, lesions or ulcers Psychiatry: Judgement and insight appear normal. Mood & affect appropriate.     Data Reviewed: I have personally reviewed following labs and imaging studies  CBC: Recent Labs  Lab 03/15/18 1813 03/16/18 0228 03/17/18 0655  WBC  21.6* 19.1* 17.8*  NEUTROABS 17.7*  --   --   HGB 10.8* 10.8* 8.9*  HCT 33.3* 33.0* 26.7*  MCV 81.4 80.9 79.9  PLT 387 330 342   Basic Metabolic Panel: Recent Labs  Lab 03/15/18 1813 03/16/18 0228 03/17/18 0655  NA 133* 138 135  K 3.8 3.9 3.2*  CL 97* 102 105  CO2 24 24 20*  GLUCOSE 244* 155* 119*  BUN 20 20 11   CREATININE 1.50* 1.63* 1.16  CALCIUM 9.2 8.3* 7.9*   GFR: Estimated Creatinine Clearance: 51.3 mL/min (by C-G formula based on SCr of 1.16 mg/dL). Liver Function Tests: Recent Labs  Lab 03/15/18 1813  AST 21  ALT 11*  ALKPHOS 79  BILITOT 0.6  PROT 8.9*  ALBUMIN 3.0*   No results for input(s): LIPASE, AMYLASE in the last 168 hours. No results for input(s): AMMONIA in the last 168 hours. Coagulation Profile: Recent Labs  Lab 03/15/18 2130  INR 1.24   Cardiac Enzymes: No results for input(s): CKTOTAL, CKMB, CKMBINDEX, TROPONINI in the last 168 hours. BNP (last 3 results) No results for input(s): PROBNP in the last 8760 hours. HbA1C: No results for input(s): HGBA1C in the last 72 hours. CBG: Recent Labs  Lab 03/16/18 0740 03/16/18 1202 03/16/18 1734 03/16/18 2130 03/17/18 0830  GLUCAP 137* 116* 133* 134* 116*   Lipid Profile: No results for input(s): CHOL, HDL, LDLCALC, TRIG, CHOLHDL, LDLDIRECT in the last 72 hours. Thyroid Function Tests: No results for input(s): TSH, T4TOTAL, FREET4, T3FREE, THYROIDAB in the last 72 hours. Anemia Panel: No results for input(s): VITAMINB12, FOLATE, FERRITIN, TIBC, IRON, RETICCTPCT in the last 72 hours. Sepsis Labs: Recent Labs  Lab 03/15/18 1839 03/15/18 2130 03/15/18 2141 03/16/18 0021  PROCALCITON  --  0.16  --   --   LATICACIDVEN 2.15* 1.6 1.72 1.4    Recent Results (from the past 240 hour(s))  Culture, blood (routine x 2)     Status: None (Preliminary result)   Collection Time: 03/15/18  6:13 PM  Result Value Ref Range Status   Specimen Description BLOOD BLOOD RIGHT FOREARM  Final   Special  Requests   Final    BOTTLES DRAWN AEROBIC AND ANAEROBIC Blood Culture adequate volume   Culture   Final    NO GROWTH < 24 HOURS Performed at [facility], [address], [place], [place] [address]    Report Status PENDING  Incomplete  Culture, blood (routine x 2)     Status: None (Preliminary result)   Collection Time: 03/15/18  6:54 PM  Result Value Ref Range Status   Specimen Description BLOOD LEFT ANTECUBITAL  Final   Special Requests   Final    BOTTLES DRAWN AEROBIC AND ANAEROBIC Blood Culture results may not be optimal due to an excessive volume of blood received in culture bottles   Culture   Final    NO GROWTH < 24 HOURS Performed at [facility], [address] [place], [place] [address]    Report Status PENDING  Incomplete         Radiology Studies: Mr Foot Right Wo Contrast  Result Date: 03/15/2018 CLINICAL DATA:  Ulceration along the lateral aspect of the forefoot adjacent to the fifth  metatarsal. Recent amputation of the third toe 1 month ago. Diabetes. EXAM: MRI OF THE RIGHT FOREFOOT WITHOUT CONTRAST TECHNIQUE: Multiplanar, multisequence MR imaging of the right forefoot was performed. No intravenous contrast was administered. COMPARISON:  Radiographs from the same day FINDINGS: Bones/Joint/Cartilage Maintained cortices without bone destruction or fracture. Minor reactive marrow edema of the lateral aspect of the fifth metatarsal head and neck and base of fifth metatarsal. No joint dislocation or effusions. Status post amputation of the third toe at the MTP articulation. Mild joint space narrowing, spurring and subchondral cystic change across the talonavicular joint with minor reactive edema along the plantar aspect of the navicular. Mild cuboid reactive edema without cortical bone loss. Ligaments Negative Muscles and Tendons Myositis of the plantar muscles of the forefoot. The flexor and extensor tendons crossing the forefoot demonstrate no acute tear or  significant tenosynovitis. Soft tissues Diffuse soft tissue swelling of the forefoot without focal fluid collections. Subcutaneous emphysema is noted of the forefoot. Soft tissue ulceration along the lateral aspect of the forefoot likely accounts for the emphysema. IMPRESSION: 1. Cellulitis and myositis of the forefoot with soft tissue ulceration adjacent to the lateral aspect of the fifth metatarsal head. Associated subcutaneous emphysema is seen of the forefoot. 2. Reactive marrow edema is noted without cortical bone loss involving the distal fifth metatarsal, proximal fifth phalanx and cuboid. 3. Status post amputation of the third toe. Electronically Signed   By: Tollie Eth M.D.   On: 03/15/2018 23:22   Dg Foot Complete Right  Result Date: 03/15/2018 CLINICAL DATA:  Right foot pain after toe amputation a couple weeks ago. EXAM: RIGHT FOOT COMPLETE - 3+ VIEW COMPARISON:  02/11/2018 FINDINGS: Interval amputation of the third toe distal to the head of the metatarsal. Minimal associated air in the soft tissues. Small vessel atherosclerotic disease is present. There is mild degenerative change over the midfoot. There is a small inferior calcaneal spur. IMPRESSION: Postsurgical change compatible with amputation of the third toe distal to the head of the metatarsal. Air in the adjacent soft tissues which may be postsurgical although cannot exclude infection. Electronically Signed   By: Elberta Fortis M.D.   On: 03/15/2018 18:58        Scheduled Meds: . amLODipine  5 mg Oral Daily  . aspirin EC  81 mg Oral Daily  . atorvastatin  20 mg Oral q1800  . clopidogrel  75 mg Oral Q breakfast  . docusate sodium  100 mg Oral BID  . feeding supplement (ENSURE ENLIVE)  237 mL Oral BID BM  . ferrous sulfate  325 mg Oral BID WC  . folic acid  1 mg Oral Daily  . gabapentin  600 mg Oral BID  . heparin  5,000 Units Subcutaneous Q8H  . insulin aspart  0-9 Units Subcutaneous TID WC  . insulin glargine  7 Units  Subcutaneous QHS  . levETIRAcetam  500 mg Oral BID  . polyethylene glycol  17 g Oral Daily  . potassium chloride  40 mEq Oral BID   Continuous Infusions: . sodium chloride 100 mL/hr at 03/17/18 0941  . cefTRIAXone (ROCEPHIN)  IV Stopped (03/16/18 2047)  . metronidazole Stopped (03/17/18 1610)  . vancomycin Stopped (03/16/18 2224)     LOS: 2 days    Dron Jaynie Collins, MD [facility] Pager [phone]  If 7PM-7AM, please contact night-coverage [email or web address] Password [email or web address] 03/17/2018, 11:01 AM

## 2018-03-17 NOTE — Progress Notes (Signed)
Blood pressure 129/60, pulse 99, temperature 99.6 F (37.6 C), resp. rate 20, height 5\' 2"  (1.575 m), weight 52.2 kg (115 lb), SpO2 99 %. Patient has been asleep all day, aroused to voice, has refused all food and only sips of fluids with medications, provider made aware, will continue to monitor.

## 2018-03-17 NOTE — Progress Notes (Signed)
Paged provider to make aware: Telemetry order is expired, k 3.2 and calcium 7.9

## 2018-03-17 NOTE — Progress Notes (Signed)
Inpatient Diabetes Program Recommendations  AACE/ADA: New Consensus Statement on Inpatient Glycemic Control (2015)  Target Ranges:  Prepandial:   less than 140 mg/dL      Peak postprandial:   less than 180 mg/dL (1-2 hours)      Critically ill patients:  140 - 180 mg/dL   Lab Results  Component Value Date   GLUCAP 103 (H) 03/17/2018   HGBA1C 11.4 (H) 02/12/2018    Review of Glycemic Control Results for Luke HickmanLOMAX, Luke Manning (MRN [id number]) as of 03/17/2018 15:36  Ref. Range 03/16/2018 17:34 03/16/2018 21:30 03/17/2018 08:30 03/17/2018 12:23  Glucose-Capillary Latest Ref Range: 65 - 99 mg/dL 132133 (H) 440134 (H) 102116 (H) 103 (H)   Diabetes history: Type 2 DM Outpatient Diabetes medications: Metformin 500 mg QAM, Lantus 10 units QD Current orders for Inpatient glycemic control: Novolog 0-9 units TID, Lantus 7 units QHS  Inpatient Diabetes Program Recommendations:    Attempted to see patient and discuss diabetes management. Patient opened eyes and went back to sleep. No appropriate time for discussion or education. Will try to see in the AM.   Thanks, Lujean RaveLauren Lorean Ekstrand, MSN, RNC-OB Diabetes Coordinator [phone] (8a-5p)

## 2018-03-17 NOTE — Progress Notes (Signed)
Initial Nutrition Assessment  DOCUMENTATION CODES:   Not applicable  INTERVENTION:  Premier Protein BID, each supplement provides 160 calories and 30 grams of protein MVI w/ Minerals  NUTRITION DIAGNOSIS:   Increased nutrient needs related to wound healing as evidenced by estimated needs.  GOAL:   Patient will meet greater than or equal to 90% of their needs  MONITOR:   PO intake, I & O's, Labs, Weight trends, Supplement acceptance, Skin  REASON FOR ASSESSMENT:   Consult Wound healing  ASSESSMENT:   Luke Manning is a 57 y.o. male with medical history significant of hypertension, hyperlipidemia, diabetes mellitus, CAD, iron deficiency anemia, CKD-2, seizure, PVD, who presents with right foot pain. Gangrene on R 3rd toe base is not healing.   Spoke with Mr. Luke Manning at bedside. Was very lethargic today, providing little history. He reports poor appetite today, was not hungry for lunch or breakfast. He states PTA he was eating 2x a day. Briefly talked about a banana and tomato sandwich but did not give much history. He says he walks he with a walker at home but his legs look very depleted compared to his upper body. Likely related to toe amputation 1 month ago. Weight is stable from then.  Labs reviewed:  K+ 3.2  Medications reviewed and include:  Colace, Iron, folic Acid, Insulin, Miralax NS at 1050mL/hr   NUTRITION - FOCUSED PHYSICAL EXAM:    Most Recent Value  Orbital Region  Mild depletion  Upper Arm Region  Mild depletion  Thoracic and Lumbar Region  Moderate depletion  Buccal Region  Mild depletion  Temple Region  No depletion  Clavicle Bone Region  No depletion  Clavicle and Acromion Bone Region  No depletion  Scapular Bone Region  No depletion  Dorsal Hand  No depletion  Patellar Region  Severe depletion  Anterior Thigh Region  Severe depletion  Posterior Calf Region  Severe depletion  Edema (RD Assessment)  None       Diet Order:  Seizure precautions Diet  Carb Modified Fluid consistency: Thin; Room service appropriate? Yes  EDUCATION NEEDS:   Not appropriate for education at this time  Skin:  Skin Assessment: Skin Integrity Issues: Skin Integrity Issues:: Other (Comment) Other: R Third Toe base Gangrene  Last BM:  03/13/2018 (Type 6)  Height:   Ht Readings from Last 1 Encounters:  03/15/18 5\' 2"  (1.575 m)    Weight:   Wt Readings from Last 1 Encounters:  03/15/18 115 lb (52.2 kg)    Ideal Body Weight:  49 kg  BMI:  Body mass index is 21.03 kg/m.  Estimated Nutritional Needs:   Kcal:  1600-1750 calories (MSJ x1.3-1.45)  Protein:  80-90 grams  Fluid:  1.6-1.8L  Dionne AnoWilliam M. Keano Guggenheim, MS, RD LDN Inpatient Clinical Dietitian Pager [phone]

## 2018-03-17 NOTE — Consult Note (Signed)
WOC Nurse wound consult note Reason for Consult: Lower extremity wound Wound type: Right lateral foot at the 5th metatarsal head area there is an ulcerated area measuring 3 cm x 3 cm x unknown depth.  There are sutures present where the third toe was previously removed. Patient states he had the toe amputation here.  Note from Dr. Ronalee BeltsBhandari on 03/16/18 at 2:07 pm indicates sepsis due to diabetic right foot wound infection.  And that an orthopedics consult has been requested.  I will defer treatment to the orthopedic team. Thank you for the consult.  Discussed plan of care with the patient and bedside nurse.  WOC nurse will not follow at this time.  Please re-consult the WOC team if needed.  Helmut MusterSherry Girolamo Lortie, RN, MSN, CWOCN, CNS-BC, pager [phone]

## 2018-03-18 ENCOUNTER — Encounter: Payer: Self-pay | Admitting: *Deleted

## 2018-03-18 ENCOUNTER — Ambulatory Visit: Payer: PPO | Admitting: Podiatry

## 2018-03-18 ENCOUNTER — Other Ambulatory Visit: Payer: Self-pay | Admitting: *Deleted

## 2018-03-18 LAB — BASIC METABOLIC PANEL
Anion gap: 9 (ref 5–15)
BUN: 9 mg/dL (ref 6–20)
CHLORIDE: 103 mmol/L (ref 101–111)
CO2: 23 mmol/L (ref 22–32)
Calcium: 8.2 mg/dL — ABNORMAL LOW (ref 8.9–10.3)
Creatinine, Ser: 1.06 mg/dL (ref 0.61–1.24)
Glucose, Bld: 125 mg/dL — ABNORMAL HIGH (ref 65–99)
POTASSIUM: 3.9 mmol/L (ref 3.5–5.1)
SODIUM: 135 mmol/L (ref 135–145)

## 2018-03-18 LAB — CBC
HEMATOCRIT: 27.7 % — AB (ref 39.0–52.0)
HEMOGLOBIN: 9.2 g/dL — AB (ref 13.0–17.0)
MCH: 26.7 pg (ref 26.0–34.0)
MCHC: 33.2 g/dL (ref 30.0–36.0)
MCV: 80.5 fL (ref 78.0–100.0)
Platelets: 369 10*3/uL (ref 150–400)
RBC: 3.44 MIL/uL — ABNORMAL LOW (ref 4.22–5.81)
RDW: 15.8 % — ABNORMAL HIGH (ref 11.5–15.5)
WBC: 21.7 10*3/uL — ABNORMAL HIGH (ref 4.0–10.5)

## 2018-03-18 LAB — GLUCOSE, CAPILLARY
GLUCOSE-CAPILLARY: 102 mg/dL — AB (ref 65–99)
Glucose-Capillary: 121 mg/dL — ABNORMAL HIGH (ref 65–99)
Glucose-Capillary: 123 mg/dL — ABNORMAL HIGH (ref 65–99)
Glucose-Capillary: 97 mg/dL (ref 65–99)

## 2018-03-18 LAB — MAGNESIUM: MAGNESIUM: 1.6 mg/dL — AB (ref 1.7–2.4)

## 2018-03-18 LAB — LEVETIRACETAM LEVEL: Levetiracetam Lvl: 6.8 ug/mL — ABNORMAL LOW (ref 10.0–40.0)

## 2018-03-18 MED ORDER — SODIUM CHLORIDE 0.9 % IV SOLN
1.0000 g | Freq: Three times a day (TID) | INTRAVENOUS | Status: AC
  Administered 2018-03-18 – 2018-03-21 (×12): 1 g via INTRAVENOUS
  Filled 2018-03-18 (×12): qty 1

## 2018-03-18 MED ORDER — MAGNESIUM SULFATE 2 GM/50ML IV SOLN
2.0000 g | Freq: Once | INTRAVENOUS | Status: AC
  Administered 2018-03-18: 2 g via INTRAVENOUS
  Filled 2018-03-18: qty 50

## 2018-03-18 MED ORDER — OXYCODONE HCL 5 MG PO TABS
10.0000 mg | ORAL_TABLET | Freq: Four times a day (QID) | ORAL | Status: DC | PRN
  Administered 2018-03-18 – 2018-03-23 (×15): 10 mg via ORAL
  Filled 2018-03-18 (×15): qty 2

## 2018-03-18 MED ORDER — INSULIN GLARGINE 100 UNIT/ML ~~LOC~~ SOLN
5.0000 [IU] | Freq: Every day | SUBCUTANEOUS | Status: DC
  Administered 2018-03-18 – 2018-03-22 (×5): 5 [IU] via SUBCUTANEOUS
  Filled 2018-03-18 (×5): qty 0.05

## 2018-03-18 NOTE — Progress Notes (Signed)
Pt states he has not decided to do surgery yet, does remember Dr Ophelia CharterYates talking to him this morning about having surgery. He states that if the antibiotics don't work then he will decide to have surgery. Pt made aware that Dr Ophelia CharterYates doesn't want him to eat/drink after MN tonight with plan for surgery tomorrow around 4pm.  Pt verbalizes understanding of the above. Pt may not make decision today, plans to talk with wife.

## 2018-03-18 NOTE — Patient Outreach (Signed)
[facility]) Care Management  03/18/2018  Luke Manning 06/26/1961 [id number]   CSW was able to make initial contact with patient today to perform phone assessment, as well as assess and assist with social work needs and services.  CSW introduced self, explained role and types of services provided through [facility] Management [facility] Care Management).  CSW further explained to patient that CSW works with Luke Manning, [facility], also with [facility] Care Management. CSW then explained the reason for the call, indicating that Ms. Luke Manning thought that patient would benefit from social work services and resources to assist with transportation to and from physician appointments.  CSW obtained two HIPAA compliant identifiers from patient, which included patient's name and date of birth. Patient admits that he needs transportation assistance to and from his physician appointments, as he reports that he will be undergoing an amputation on Wednesday, March 19, 2018.  Patient was unclear as to whether or not his whole foot was being amputated or just his 5th digit.  CSW offered counseling and supportive services, where appropriate.  CSW inquired as to whether or not patient would be interested in attending a support group for amputees.  Patient denied, but was appreciative of the suggestion. CSW agreed to complete a [employer] Midwife(Specialized Community Area Transportation) application on patient's behalf and submit directly to [employer]([place] [employer]) for processing.  CSW also agreed to mail patient a list of transportation resources.  CSW will then follow-up with patient within the next two weeks to ensure that patient received the packet of information, as well as answer any questions he may have at that time.  Patient voiced understanding and was agreeable to this plan. [facility] CM Care Plan Problem One     Most Recent Value  Care Plan Problem One  Lack of transportation to and from  physician appointments.  Role Documenting the Problem One  Clinical Social Worker  Care Plan for Problem One  Active  [facility] Long Term Goal   Patient will have secure transportation arrangements in place through [employer], within the next 45 days.  [facility] Long Term Goal Start Date  03/18/18  Interventions for Problem One Long Term Goal  CSW will complete a [employer] application on patient and [facility].  [facility] CM Short Term Goal #1   Patient will follow-up with a representative from [employer] to check the status of his application, within the next two weeks.  [facility] CM Short Term Goal #1 Start Date  03/18/18  Interventions for Short Term Goal #1  Patient has been given the contact number of [employer].  [facility] CM Short Term Goal #2   Patient will meet with a representative from [employer] to perform a face-to-face assessment, within the next four weeks.  [facility] CM Short Term Goal #2 Start Date  03/18/18  Interventions for Short Term Goal #2  Patient is aware that a phone assessment and a face-to-face assessment will need to be initiated as part of his application process.     Luke Manning, Luke Manning, Luke Manning, Luke Manning  Licensed Restaurant manager, fast foodClinical Social Worker  [facility] Care Management [facility]  [address]ailing [place], [place], [place] [address] Physical Address-[address], [place], [place] [address] Toll Free Main # [phone] Fax # [phone] Cell # [phone]  Office # [phone] Luke Manning

## 2018-03-18 NOTE — Progress Notes (Signed)
Pharmacy Antibiotic Note  Rob Hickmanlvin Taft is a 57 y.o. male admitted on 03/15/2018 with RLE wound infection.  Pt is s/p amputation of 3rd toe and returns with wound infection, cellulitis, possible early osteo near amputation site.  Noted plan is for R BKA on 3/27. Pharmacy has been consulted for Cefepime dosing.  Plan: Cefepime 1gm IV q8h Flagyl 500mg  IV q8h (per MD) Follow-up cx data, clinical progress, and abx LOT  Height: 5\' 2"  (157.5 cm) Weight: 115 lb (52.2 kg) IBW/kg (Calculated) : 54.6  Temp (24hrs), Avg:99.8 F (37.7 C), Min:99.3 F (37.4 C), Max:100.8 F (38.2 C)  Recent Labs  Lab 03/15/18 1813 03/15/18 1839 03/15/18 2130 03/15/18 2141 03/16/18 0021 03/16/18 0228 03/17/18 0655 03/18/18 0516 03/18/18 0854  WBC 21.6*  --   --   --   --  19.1* 17.8*  --  21.7*  CREATININE 1.50*  --   --   --   --  1.63* 1.16 1.06  --   LATICACIDVEN  --  2.15* 1.6 1.72 1.4  --   --   --   --     Estimated Creatinine Clearance: 56.1 mL/min (by C-G formula based on SCr of 1.06 mg/dL).    No Known Allergies  Antimicrobials this admission: Vancomycin 3/23 >> 3/25 Zosyn 3/23 x 1 Ceftriaxone 3/23>> 3/25 Flagyl 3/23>> Cefepime 3/26 >>  Dose adjustments this admission:  Microbiology results: 3/23 BCx: ngtd  Thank you for allowing pharmacy to be a part of this patient's care.  Toys 'R' UsKimberly Jocsan Mcginley, Pharm.D., BCPS Clinical Pharmacist Pager: [phone] Clinical phone for 03/18/2018 from 8:30-4:00 is [phone]. After 4pm, please call Main Rx ([phone]) for assistance. 03/18/2018 10:30 AM

## 2018-03-18 NOTE — Progress Notes (Signed)
[facility]  Date of Admission:  03/15/2018     Total days of antibiotics 4  Day 2 cefepime   Day 4 metronidazole          ASSESSMENT: 57 y.o. M with diabetic foot infection and changes on imaging suggestive of early/evolving osteomyelitis of 5th toe. Continues to have some low grade fevers on antibiotics. He is extremely bothered by the pain of his current situation and tells me that he likely will "Let Dr. Ophelia CharterYates cut it off" as scheduled tomorrow.   PLAN: 1. No changes to antibiotics at this time.  2. If he proceeds with amputation tomorrow as scheduled likely will be able to shorten dramatically his antibiotic duration barring clean margins are obtained.  3. Flagyl is potentially contributing to his nausea. Would consider stopping this and continuing only on cefepime until surgery.   Principal Problem:   Diabetic foot infection (HCC) Active Problems:   Type II diabetes mellitus with renal manifestations (HCC)   HTN (hypertension)   Seizure disorder (HCC)   Wound infection   HLD (hyperlipidemia)   PVD (peripheral vascular disease) (HCC)   CAD (coronary artery disease)   Iron deficiency anemia   Acute renal failure superimposed on stage 2 chronic kidney disease (HCC)   Sepsis (HCC)   Cellulitis of right lower extremity   . amLODipine  5 mg Oral Daily  . aspirin EC  81 mg Oral Daily  . atorvastatin  20 mg Oral q1800  . clopidogrel  75 mg Oral Q breakfast  . docusate sodium  100 mg Oral BID  . ferrous sulfate  325 mg Oral BID WC  . folic acid  1 mg Oral Daily  . gabapentin  600 mg Oral BID  . heparin  5,000 Units Subcutaneous Q8H  . insulin aspart  0-9 Units Subcutaneous TID WC  . insulin glargine  7 Units Subcutaneous QHS  . levETIRAcetam  500 mg Oral BID  . multivitamin with minerals  1 tablet Oral Daily  . polyethylene glycol  17 g Oral Daily  . protein supplement shake  11 oz Oral BID BM    SUBJECTIVE: Still with pain in the  right lower extremity and decreased appetite overall. He has been going back and forth with deciding to go through with amputation or not. Continues to have low grade fevers to 100.8 deg. Tolerating the antibiotics well without side effects.   No Known Allergies  OBJECTIVE: Vitals:   03/17/18 1813 03/17/18 2204 03/18/18 0547 03/18/18 0858  BP: 129/60 135/69 (!) 110/51 (!) 153/94  Pulse: 99 (!) 109 (!) 107 (!) 105  Resp:  18 18 18   Temp: 99.6 F (37.6 C) (!) 100.8 F (38.2 C) (!) 100.6 F (38.1 C)   TempSrc:      SpO2: 99% 99% 100% 100%  Weight:      Height:       Body mass index is 21.03 kg/m.  Physical Exam  Constitutional: He is oriented to person, place, and time and well-developed, well-nourished, and in no distress.  Grimacing and obviously in a fair amount of pain as he is resting in bed.   HENT:  Mouth/Throat: No oral lesions. Normal dentition. No dental caries.  Eyes: No scleral icterus.  Cardiovascular: Regular rhythm and normal heart sounds. Tachycardia present.  Pulmonary/Chest: Effort normal and breath sounds normal.  Abdominal: Soft. He exhibits no distension. There is no tenderness.  Musculoskeletal:  Right foot  partially wrapped. No drainage to bandage.   Lymphadenopathy:    He has no cervical adenopathy.  Neurological: He is alert and oriented to person, place, and time.  Skin: Skin is warm and dry. No rash noted.  Psychiatric: Mood and affect normal.  Vitals reviewed.   Lab Results Lab Results  Component Value Date   WBC 21.7 (H) 03/18/2018   HGB 9.2 (L) 03/18/2018   HCT 27.7 (L) 03/18/2018   MCV 80.5 03/18/2018   PLT 369 03/18/2018    Lab Results  Component Value Date   CREATININE 1.06 03/18/2018   BUN 9 03/18/2018   NA 135 03/18/2018   K 3.9 03/18/2018   CL 103 03/18/2018   CO2 23 03/18/2018    Lab Results  Component Value Date   ALT 11 (L) 03/15/2018   AST 21 03/15/2018   ALKPHOS 79 03/15/2018   BILITOT 0.6 03/15/2018      Microbiology: BCx 3/23 >> no growth   Rexene Alberts, MSN, NP-C [facility] Cell: [phone] Pager: [phone]  03/18/2018  11:25 AM

## 2018-03-18 NOTE — Progress Notes (Signed)
   Subjective:    Patient reports pain as moderate.    Objective: Vital signs in last 24 hours: Temp:  [99.3 F (37.4 C)-100.8 F (38.2 C)] 100.6 F (38.1 C) (03/26 0547) Pulse Rate:  [99-109] 107 (03/26 0547) Resp:  [18-20] 18 (03/26 0547) BP: (110-163)/(51-84) 110/51 (03/26 0547) SpO2:  [99 %-100 %] 100 % (03/26 0547)  Intake/Output from previous day: 03/25 0701 - 03/26 0700 In: 220 [P.O.:120; IV Piggyback:100] Out: 900 [Urine:900] Intake/Output this shift: No intake/output data recorded.  Recent Labs    03/15/18 1813 03/16/18 0228 03/17/18 0655  HGB 10.8* 10.8* 8.9*   Recent Labs    03/16/18 0228 03/17/18 0655  WBC 19.1* 17.8*  RBC 4.08* 3.34*  HCT 33.0* 26.7*  PLT 330 342   Recent Labs    03/17/18 0655 03/18/18 0516  NA 135 135  K 3.2* 3.9  CL 105 103  CO2 20* 23  BUN 11 9  CREATININE 1.16 1.06  GLUCOSE 119* 125*  CALCIUM 7.9* 8.2*   Recent Labs    03/15/18 2130  INR 1.24    subQ air dorsal foot. lateral skin necrosis over 5th MT shaft. necrosis of skin edges from 3rd toe amputation.  No results found.  Assessment/Plan:    Plan: Patient agrees to proceed with right below-knee amputation.  Surgery for Wednesday afternoon around 4 PM.  Procedure discussed.  Patient's wife was unable to come in this morning.  She has my cell phone number and can call me if she has any questions.  Eldred MangesMark C Shanece Cochrane 03/18/2018, 8:22 AM

## 2018-03-18 NOTE — Progress Notes (Signed)
[facility] will follow pt with ID team to support Home [facility]fusion Pharm[facility] services for home IV ABX at DC as ordered.  If patient discharges after hours, please call [phone].   Sedalia Mutaamela S Chandler 03/18/2018, 9:51 PM

## 2018-03-18 NOTE — Progress Notes (Signed)
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 Ophelia CharterYates presented with worsening right foot pain.  In the ER patient was found to have WBC of 21.6, elevated lactic acid level.  He started on antibiotics and admitted for further evaluation.  Assessment & Plan:   #Sepsis due to diabetic right foot wound infection: Patient had surgery of right toe about a month ago.  MRI of the foot showed cellulitis and myositis of the forefoot with soft tissue ulceration and associated subcutaneous emphysema.  -Evaluated by orthopedics and infectious disease.  Currently on cefepime and Flagyl.  Patient is still having low-grade temperature.  Follow-up culture results.  Plan for possible surgical intervention tomorrow per orthopedics.   #Pain management: Patient reported that his pain is currently controlled.  Continue oral oxycodone and IV morphine as needed for breakthrough pain management.  Continue MiraLAX.    #Type 2 diabetes with renal manifestation: Monitor blood sugar level.  Reduce the dose of Lantus to 5 units tonight since patient will be n.p.o. for possible procedure.  #Hypertension: Monitor blood pressure.  On amlodipine.  #Hypokalemia: Improved, repleted magnesium for hypomagnesemia.  Repeat lab.   #Seizure disorder: Continue Keppra, Ativan as needed and seizure precaution.  #History of hyperlipidemia: Continue Lipitor.  #Peripheral vascular disease a status post angioplasty of right superficial femoral artery by vascular surgery on February 2019.  Continue aspirin Plavix.    #Acute kidney injury: Improved.  Continue low rate of IV fluid.  Monitor BMP.  DVT prophylaxis: Heparin subcutaneous Code Status: Full  code Family Communication: No family at bedside Disposition Plan: Likely discharge home in 2-3 days    Consultants:   Orthopedics  Procedures: None Antimicrobials: Flagyl and cefepime only.  Subjective: Seen and examined at bedside.  Pain is controlled.  Denies headache, dizziness, nausea vomiting chest pain.  Objective: Vitals:   03/17/18 1813 03/17/18 2204 03/18/18 0547 03/18/18 0858  BP: 129/60 135/69 (!) 110/51 (!) 153/94  Pulse: 99 (!) 109 (!) 107 (!) 105  Resp:  18 18 18   Temp: 99.6 F (37.6 C) (!) 100.8 F (38.2 C) (!) 100.6 F (38.1 C)   TempSrc:      SpO2: 99% 99% 100% 100%  Weight:      Height:        Intake/Output Summary (Last 24 hours) at 03/18/2018 1257 Last data filed at 03/18/2018 1029 Gross per 24 hour  Intake 370 ml  Output 900 ml  Net -530 ml   Filed Weights   03/15/18 1753 03/15/18 1837  Weight: 52.2 kg (115 lb) 52.2 kg (115 lb)    Examination:  General exam: Not in distress Respiratory system: Clear bilateral, respiratory for normal Cardiovascular system: Regular rate rhythm S1-S2 normal.  No pedal edema. Gastrointestinal system: Abdomen is nondistended, soft and nontender. Normal bowel sounds heard. Central nervous system: Alert awake and following commands. Extremities: Right foot has dressing applied, Skin: No rashes, lesions or ulcers Psychiatry: Judgement and insight appear normal. Mood & affect appropriate.     Data Reviewed: I have personally reviewed following labs and imaging studies  CBC: Recent Labs  Lab 03/15/18 1813 03/16/18 0228 03/17/18 0655 03/18/18 0854  WBC 21.6* 19.1* 17.8* 21.7*  NEUTROABS 17.7*  --   --   --  HGB 10.8* 10.8* 8.9* 9.2*  HCT 33.3* 33.0* 26.7* 27.7*  MCV 81.4 80.9 79.9 80.5  PLT 387 330 342 369   Basic Metabolic Panel: Recent Labs  Lab 03/15/18 1813 03/16/18 0228 03/17/18 0655 03/18/18 0516  NA 133* 138 135 135  K 3.8 3.9 3.2* 3.9  CL 97* 102 105 103  CO2 24 24 20* 23  GLUCOSE  244* 155* 119* 125*  BUN 20 20 11 9   CREATININE 1.50* 1.63* 1.16 1.06  CALCIUM 9.2 8.3* 7.9* 8.2*  MG  --   --   --  1.6*   GFR: Estimated Creatinine Clearance: 56.1 mL/min (by C-G formula based on SCr of 1.06 mg/dL). Liver Function Tests: Recent Labs  Lab 03/15/18 1813  AST 21  ALT 11*  ALKPHOS 79  BILITOT 0.6  PROT 8.9*  ALBUMIN 3.0*   No results for input(s): LIPASE, AMYLASE in the last 168 hours. No results for input(s): AMMONIA in the last 168 hours. Coagulation Profile: Recent Labs  Lab 03/15/18 2130  INR 1.24   Cardiac Enzymes: No results for input(s): CKTOTAL, CKMB, CKMBINDEX, TROPONINI in the last 168 hours. BNP (last 3 results) No results for input(s): PROBNP in the last 8760 hours. HbA1C: No results for input(s): HGBA1C in the last 72 hours. CBG: Recent Labs  Lab 03/17/18 1645 03/17/18 2203 03/17/18 2313 03/18/18 0806 03/18/18 1210  GLUCAP 102* 67 103* 102* 121*   Lipid Profile: No results for input(s): CHOL, HDL, LDLCALC, TRIG, CHOLHDL, LDLDIRECT in the last 72 hours. Thyroid Function Tests: No results for input(s): TSH, T4TOTAL, FREET4, T3FREE, THYROIDAB in the last 72 hours. Anemia Panel: No results for input(s): VITAMINB12, FOLATE, FERRITIN, TIBC, IRON, RETICCTPCT in the last 72 hours. Sepsis Labs: Recent Labs  Lab 03/15/18 1839 03/15/18 2130 03/15/18 2141 03/16/18 0021  PROCALCITON  --  0.16  --   --   LATICACIDVEN 2.15* 1.6 1.72 1.4    Recent Results (from the past 240 hour(s))  Culture, blood (routine x 2)     Status: None (Preliminary result)   Collection Time: 03/15/18  6:13 PM  Result Value Ref Range Status   Specimen Description BLOOD BLOOD RIGHT FOREARM  Final   Special Requests   Final    BOTTLES DRAWN AEROBIC AND ANAEROBIC Blood Culture adequate volume   Culture   Final    NO GROWTH 3 DAYS Performed at [facility], [address] [place], [place] [address]    Report Status PENDING  Incomplete  Culture, blood  (routine x 2)     Status: None (Preliminary result)   Collection Time: 03/15/18  6:54 PM  Result Value Ref Range Status   Specimen Description BLOOD LEFT ANTECUBITAL  Final   Special Requests   Final    BOTTLES DRAWN AEROBIC AND ANAEROBIC Blood Culture results may not be optimal due to an excessive volume of blood received in culture bottles   Culture   Final    NO GROWTH 3 DAYS Performed at [facility], [address], [place], [place] [address]    Report Status PENDING  Incomplete         Radiology Studies: No results found.      Scheduled Meds: . amLODipine  5 mg Oral Daily  . aspirin EC  81 mg Oral Daily  . atorvastatin  20 mg Oral q1800  . clopidogrel  75 mg Oral Q breakfast  . docusate sodium  100 mg Oral BID  . ferrous sulfate  325 mg Oral BID WC  . folic acid  1 mg Oral Daily  . gabapentin  600 mg Oral BID  . heparin  5,000 Units Subcutaneous Q8H  . insulin aspart  0-9 Units Subcutaneous TID WC  . insulin glargine  5 Units Subcutaneous QHS  . levETIRAcetam  500 mg Oral BID  . multivitamin with minerals  1 tablet Oral Daily  . polyethylene glycol  17 g Oral Daily  . protein supplement shake  11 oz Oral BID BM   Continuous Infusions: . sodium chloride 50 mL/hr at 03/17/18 1147  . ceFEPime (MAXIPIME) IV Stopped (03/18/18 1029)  . metronidazole Stopped (03/18/18 0516)     LOS: 3 days    Dron Jaynie Collins, MD [facility] Pager [phone]  If 7PM-7AM, please contact night-coverage [email or web address] Password [facility] 03/18/2018, 12:57 PM

## 2018-03-18 NOTE — H&P (View-Only) (Signed)
   Subjective:    Patient reports pain as moderate.    Objective: Vital signs in last 24 hours: Temp:  [99.3 F (37.4 C)-100.8 F (38.2 C)] 100.6 F (38.1 C) (03/26 0547) Pulse Rate:  [99-109] 107 (03/26 0547) Resp:  [18-20] 18 (03/26 0547) BP: (110-163)/(51-84) 110/51 (03/26 0547) SpO2:  [99 %-100 %] 100 % (03/26 0547)  Intake/Output from previous day: 03/25 0701 - 03/26 0700 In: 220 [P.O.:120; IV Piggyback:100] Out: 900 [Urine:900] Intake/Output this shift: No intake/output data recorded.  Recent Labs    03/15/18 1813 03/16/18 0228 03/17/18 0655  HGB 10.8* 10.8* 8.9*   Recent Labs    03/16/18 0228 03/17/18 0655  WBC 19.1* 17.8*  RBC 4.08* 3.34*  HCT 33.0* 26.7*  PLT 330 342   Recent Labs    03/17/18 0655 03/18/18 0516  NA 135 135  K 3.2* 3.9  CL 105 103  CO2 20* 23  BUN 11 9  CREATININE 1.16 1.06  GLUCOSE 119* 125*  CALCIUM 7.9* 8.2*   Recent Labs    03/15/18 2130  INR 1.24    subQ air dorsal foot. lateral skin necrosis over 5th MT shaft. necrosis of skin edges from 3rd toe amputation.  No results found.  Assessment/Plan:    Plan: Patient agrees to proceed with right below-knee amputation.  Surgery for Wednesday afternoon around 4 PM.  Procedure discussed.  Patient's wife was unable to come in this morning.  She has my cell phone number and can call me if she has any questions.  Luke Manning 03/18/2018, 8:22 AM      

## 2018-03-18 NOTE — Progress Notes (Signed)
Inpatient Diabetes Program Recommendations  AACE/ADA: New Consensus Statement on Inpatient Glycemic Control (2015)  Target Ranges:  Prepandial:   less than 140 mg/dL      Peak postprandial:   less than 180 mg/dL (1-2 hours)      Critically ill patients:  140 - 180 mg/dL   Lab Results  Component Value Date   GLUCAP 121 (H) 03/18/2018   HGBA1C 11.4 (H) 02/12/2018    Review of Glycemic Control Results for Luke Manning, Luke Manning (MRN [id number]) as of 03/18/2018 13:15  Ref. Range 03/17/2018 22:03 03/17/2018 23:13 03/18/2018 08:06 03/18/2018 12:10  Glucose-Capillary Latest Ref Range: 65 - 99 mg/dL 67 103 (H) 102 (H) 121 (H)   Diabetes history: Type 2 DM Outpatient Diabetes medications: Metformin 500 mg QAM, Lantus 10 units QD Current orders for Inpatient glycemic control: Novolog 0-9 units TID, Lantus 7 units QHS   Inpatient Diabetes Program Recommendations:    Noted low BS of 67 mg/dL, Patient is complaining of nausea and vomiting and has had nothing by mouth. No additional recs at this time.   Spoke with patient and verified that he was not taking home medications. Patient states, "I couldn't afford the medications, so I just quit taking them." In talking with the patient, he said that he could afford $40 per month. Patient not checking BS and will need a meter and strips. Blood glucose meter and kit (include lancets and strips) ([id number]) Encouraged patient to begin checking 2/day and to call the PCP with concerns. Seems more appropriate to switch insulin for compliance and affordability, however, difficult to determine what he will need based off of N/V and current insulin requirements. Will follow  Reviewed patient's current A1c of 114.%.Explained what a A1c is and what it measures. Also reviewed goal A1c with patient, importance of good glucose control @ home, and blood sugar goals. Discussed long term complications and the patho behind why, vascular changes, and stressed the importance of  communicating concerns as he needs insulin.  Will place consult for [facility], and case management.  Thanks, Bronson Curb, MSN, RNC-OB Diabetes Coordinator [phone] (8a-5p)

## 2018-03-19 ENCOUNTER — Encounter (HOSPITAL_COMMUNITY)
Admission: EM | Disposition: A | Discharge status: Skilled Nursing Facility | Payer: Self-pay | Source: Home / Self Care | Attending: Internal Medicine

## 2018-03-19 ENCOUNTER — Encounter (HOSPITAL_COMMUNITY): Payer: Self-pay | Admitting: Certified Registered Nurse Anesthetist

## 2018-03-19 ENCOUNTER — Inpatient Hospital Stay (HOSPITAL_COMMUNITY): Payer: PPO | Admitting: Certified Registered Nurse Anesthetist

## 2018-03-19 DIAGNOSIS — E11628 Type 2 diabetes mellitus with other skin complications: Secondary | ICD-10-CM

## 2018-03-19 DIAGNOSIS — I739 Peripheral vascular disease, unspecified: Secondary | ICD-10-CM

## 2018-03-19 DIAGNOSIS — L089 Local infection of the skin and subcutaneous tissue, unspecified: Secondary | ICD-10-CM

## 2018-03-19 HISTORY — PX: AMPUTATION: SHX166

## 2018-03-19 LAB — BASIC METABOLIC PANEL
Anion gap: 12 (ref 5–15)
BUN: 11 mg/dL (ref 6–20)
CALCIUM: 8 mg/dL — AB (ref 8.9–10.3)
CO2: 21 mmol/L — AB (ref 22–32)
CREATININE: 0.98 mg/dL (ref 0.61–1.24)
Chloride: 103 mmol/L (ref 101–111)
GFR calc non Af Amer: >60 mL/min (ref >60)
Glucose, Bld: 103 mg/dL — ABNORMAL HIGH (ref 65–99)
Potassium: 3.4 mmol/L — ABNORMAL LOW (ref 3.5–5.1)
SODIUM: 136 mmol/L (ref 135–145)

## 2018-03-19 LAB — CBC
HEMATOCRIT: 26.3 % — AB (ref 39.0–52.0)
Hemoglobin: 8.8 g/dL — ABNORMAL LOW (ref 13.0–17.0)
MCH: 26.9 pg (ref 26.0–34.0)
MCHC: 33.5 g/dL (ref 30.0–36.0)
MCV: 80.4 fL (ref 78.0–100.0)
Platelets: 363 10*3/uL (ref 150–400)
RBC: 3.27 MIL/uL — ABNORMAL LOW (ref 4.22–5.81)
RDW: 16 % — AB (ref 11.5–15.5)
WBC: 23.1 10*3/uL — ABNORMAL HIGH (ref 4.0–10.5)

## 2018-03-19 LAB — GLUCOSE, CAPILLARY
GLUCOSE-CAPILLARY: 96 mg/dL (ref 65–99)
Glucose-Capillary: 98 mg/dL (ref 65–99)
Glucose-Capillary: 93 mg/dL (ref 65–99)
Glucose-Capillary: 88 mg/dL (ref 65–99)
Glucose-Capillary: 150 mg/dL — ABNORMAL HIGH (ref 65–99)

## 2018-03-19 LAB — SURGICAL PCR SCREEN
MRSA, PCR: NEGATIVE
Staphylococcus aureus: NEGATIVE

## 2018-03-19 LAB — MAGNESIUM: Magnesium: 1.8 mg/dL (ref 1.7–2.4)

## 2018-03-19 SURGERY — AMPUTATION BELOW KNEE
Anesthesia: General | Site: Leg Lower | Laterality: Right

## 2018-03-19 MED ORDER — ONDANSETRON HCL 4 MG/2ML IJ SOLN
INTRAMUSCULAR | Status: DC | PRN
  Administered 2018-03-19: 4 mg via INTRAVENOUS

## 2018-03-19 MED ORDER — MIDAZOLAM HCL 5 MG/5ML IJ SOLN
INTRAMUSCULAR | Status: DC | PRN
  Administered 2018-03-19: 1 mg via INTRAVENOUS

## 2018-03-19 MED ORDER — MIDAZOLAM HCL 2 MG/2ML IJ SOLN
INTRAMUSCULAR | Status: AC
  Filled 2018-03-19: qty 2

## 2018-03-19 MED ORDER — LIDOCAINE 2% (20 MG/ML) 5 ML SYRINGE
INTRAMUSCULAR | Status: DC | PRN
  Administered 2018-03-19: 40 mg via INTRAVENOUS

## 2018-03-19 MED ORDER — METOCLOPRAMIDE HCL 10 MG PO TABS: 5.0000 mg | ORAL_TABLET | Freq: Three times a day (TID) | ORAL | Status: DC | PRN

## 2018-03-19 MED ORDER — KETAMINE HCL 10 MG/ML IJ SOLN
INTRAMUSCULAR | Status: DC | PRN
  Administered 2018-03-19: 25 mg via INTRAVENOUS

## 2018-03-19 MED ORDER — KETAMINE HCL 10 MG/ML IJ SOLN
INTRAMUSCULAR | Status: AC
  Filled 2018-03-19: qty 1

## 2018-03-19 MED ORDER — LACTATED RINGERS IV SOLN
INTRAVENOUS | Status: DC
  Administered 2018-03-19: 15:00:00 via INTRAVENOUS

## 2018-03-19 MED ORDER — DEXAMETHASONE SODIUM PHOSPHATE 10 MG/ML IJ SOLN
INTRAMUSCULAR | Status: AC
  Filled 2018-03-19: qty 1

## 2018-03-19 MED ORDER — PROPOFOL 10 MG/ML IV BOLUS
INTRAVENOUS | Status: DC | PRN
  Administered 2018-03-19: 150 mg via INTRAVENOUS
  Administered 2018-03-19: 50 mg via INTRAVENOUS

## 2018-03-19 MED ORDER — MEPERIDINE HCL 50 MG/ML IJ SOLN: 6.2500 mg | INTRAMUSCULAR | Status: DC | PRN

## 2018-03-19 MED ORDER — DIPHENHYDRAMINE HCL 50 MG/ML IJ SOLN
INTRAMUSCULAR | Status: AC
  Filled 2018-03-19: qty 1

## 2018-03-19 MED ORDER — ONDANSETRON HCL 4 MG/2ML IJ SOLN: 4.0000 mg | Freq: Four times a day (QID) | INTRAMUSCULAR | Status: DC | PRN

## 2018-03-19 MED ORDER — FENTANYL CITRATE (PF) 100 MCG/2ML IJ SOLN
INTRAMUSCULAR | Status: DC | PRN
  Administered 2018-03-19 (×5): 25 ug via INTRAVENOUS

## 2018-03-19 MED ORDER — METOCLOPRAMIDE HCL 5 MG/ML IJ SOLN: 5.0000 mg | Freq: Three times a day (TID) | INTRAMUSCULAR | Status: DC | PRN

## 2018-03-19 MED ORDER — ONDANSETRON HCL 4 MG PO TABS: 4.0000 mg | ORAL_TABLET | Freq: Four times a day (QID) | ORAL | Status: DC | PRN

## 2018-03-19 MED ORDER — DEXAMETHASONE SODIUM PHOSPHATE 10 MG/ML IJ SOLN
INTRAMUSCULAR | Status: DC | PRN
  Administered 2018-03-19: 5 mg via INTRAVENOUS

## 2018-03-19 MED ORDER — PROMETHAZINE HCL 25 MG/ML IJ SOLN: 6.2500 mg | INTRAMUSCULAR | Status: DC | PRN

## 2018-03-19 MED ORDER — DIPHENHYDRAMINE HCL 50 MG/ML IJ SOLN
INTRAMUSCULAR | Status: DC | PRN
  Administered 2018-03-19: 12.5 mg via INTRAVENOUS

## 2018-03-19 MED ORDER — 0.9 % SODIUM CHLORIDE (POUR BTL) OPTIME
TOPICAL | Status: DC | PRN
  Administered 2018-03-19: 1000 mL

## 2018-03-19 MED ORDER — DOCUSATE SODIUM 100 MG PO CAPS: 100.0000 mg | ORAL_CAPSULE | Freq: Two times a day (BID) | ORAL | Status: DC

## 2018-03-19 MED ORDER — MIDAZOLAM HCL 2 MG/2ML IJ SOLN: 0.5000 mg | Freq: Once | INTRAMUSCULAR | Status: DC | PRN

## 2018-03-19 MED ORDER — FENTANYL CITRATE (PF) 250 MCG/5ML IJ SOLN
INTRAMUSCULAR | Status: AC
  Filled 2018-03-19: qty 5

## 2018-03-19 MED ORDER — ONDANSETRON HCL 4 MG/2ML IJ SOLN
INTRAMUSCULAR | Status: AC
  Filled 2018-03-19: qty 2

## 2018-03-19 MED ORDER — HYDROMORPHONE HCL 1 MG/ML IJ SOLN: 0.2500 mg | INTRAMUSCULAR | Status: DC | PRN

## 2018-03-19 MED ORDER — DEXMEDETOMIDINE HCL IN NACL 200 MCG/50ML IV SOLN
INTRAVENOUS | Status: DC | PRN
  Administered 2018-03-19: 12 ug via INTRAVENOUS

## 2018-03-19 SURGICAL SUPPLY — 60 items
BANDAGE ACE 4X5 VEL STRL LF (GAUZE/BANDAGES/DRESSINGS) ×3 IMPLANT
BANDAGE ACE 6X5 VEL STRL LF (GAUZE/BANDAGES/DRESSINGS) ×3 IMPLANT
BANDAGE ESMARK 6X9 LF (GAUZE/BANDAGES/DRESSINGS) IMPLANT
BLADE SAW SAG 29X58X.64 (BLADE) ×3 IMPLANT
BNDG COHESIVE 6X5 TAN STRL LF (GAUZE/BANDAGES/DRESSINGS) ×3 IMPLANT
BNDG ESMARK 6X9 LF (GAUZE/BANDAGES/DRESSINGS) ×3
BNDG GAUZE ELAST 4 BULKY (GAUZE/BANDAGES/DRESSINGS) ×6 IMPLANT
CLEANER TIP ELECTROSURG 2X2 (MISCELLANEOUS) ×3 IMPLANT
COVER SURGICAL LIGHT HANDLE (MISCELLANEOUS) ×3 IMPLANT
CUFF TOURNIQUET SINGLE 34IN LL (TOURNIQUET CUFF) ×2 IMPLANT
CUFF TOURNIQUET SINGLE 44IN (TOURNIQUET CUFF) IMPLANT
DRAIN PENROSE 1/2X12 LTX STRL (WOUND CARE) IMPLANT
DRAPE HALF SHEET 40X57 (DRAPES) ×9 IMPLANT
DRAPE INCISE IOBAN 66X45 STRL (DRAPES) ×3 IMPLANT
DRAPE ORTHO SPLIT 77X108 STRL (DRAPES) ×2
DRAPE SURG ORHT 6 SPLT 77X108 (DRAPES) ×1 IMPLANT
DRAPE U-SHAPE 47X51 STRL (DRAPES) ×3 IMPLANT
DURAPREP 26ML APPLICATOR (WOUND CARE) ×3 IMPLANT
EVACUATOR 1/8 PVC DRAIN (DRAIN) IMPLANT
GAUZE SPONGE 4X4 12PLY STRL (GAUZE/BANDAGES/DRESSINGS) ×5 IMPLANT
GAUZE XEROFORM 5X9 LF (GAUZE/BANDAGES/DRESSINGS) ×3 IMPLANT
GLOVE BIOGEL PI IND STRL 7.0 (GLOVE) IMPLANT
GLOVE BIOGEL PI IND STRL 7.5 (GLOVE) IMPLANT
GLOVE BIOGEL PI IND STRL 8 (GLOVE) ×2 IMPLANT
GLOVE BIOGEL PI INDICATOR 7.0 (GLOVE) ×2
GLOVE BIOGEL PI INDICATOR 7.5 (GLOVE) ×2
GLOVE BIOGEL PI INDICATOR 8 (GLOVE) ×6
GLOVE ORTHO TXT STRL SZ7.5 (GLOVE) ×6 IMPLANT
GOWN STRL REUS W/ TWL LRG LVL3 (GOWN DISPOSABLE) ×1 IMPLANT
GOWN STRL REUS W/ TWL XL LVL3 (GOWN DISPOSABLE) ×1 IMPLANT
GOWN STRL REUS W/TWL 2XL LVL3 (GOWN DISPOSABLE) ×3 IMPLANT
GOWN STRL REUS W/TWL LRG LVL3 (GOWN DISPOSABLE) ×2
GOWN STRL REUS W/TWL XL LVL3 (GOWN DISPOSABLE) ×2
IMMOBILIZER KNEE 20 (SOFTGOODS) ×3 IMPLANT
IMMOBILIZER KNEE 20 THIGH 36 (SOFTGOODS) IMPLANT
KIT BASIN OR (CUSTOM PROCEDURE TRAY) ×3 IMPLANT
KIT TURNOVER KIT B (KITS) ×3 IMPLANT
MANIFOLD NEPTUNE II (INSTRUMENTS) ×3 IMPLANT
NDL MAYO TROCAR (NEEDLE) ×1 IMPLANT
NEEDLE MAYO TROCAR (NEEDLE) ×3 IMPLANT
NS IRRIG 1000ML POUR BTL (IV SOLUTION) ×3 IMPLANT
PACK GENERAL/GYN (CUSTOM PROCEDURE TRAY) ×3 IMPLANT
PAD ABD 8X10 STRL (GAUZE/BANDAGES/DRESSINGS) ×2 IMPLANT
PAD ARMBOARD 7.5X6 YLW CONV (MISCELLANEOUS) ×6 IMPLANT
PADDING CAST COTTON 6X4 STRL (CAST SUPPLIES) ×2 IMPLANT
SPONGE LAP 18X18 X RAY DECT (DISPOSABLE) IMPLANT
STAPLER VISISTAT 35W (STAPLE) ×3 IMPLANT
STOCKINETTE IMPERVIOUS LG (DRAPES) IMPLANT
SUT ETHILON 2 0 FS 18 (SUTURE) ×4 IMPLANT
SUT SILK 3 0 SH CR/8 (SUTURE) ×3 IMPLANT
SUT VIC AB 0 CT1 27 (SUTURE) ×2
SUT VIC AB 0 CT1 27XBRD ANBCTR (SUTURE) ×1 IMPLANT
SUT VIC AB 1 CTX 18 (SUTURE) ×3 IMPLANT
SUT VIC AB 2-0 CT1 27 (SUTURE) ×2
SUT VIC AB 2-0 CT1 TAPERPNT 27 (SUTURE) IMPLANT
SUT VICRYL 0 TIES 12 18 (SUTURE) ×3 IMPLANT
SUT VICRYL AB 2 0 TIES (SUTURE) ×3 IMPLANT
TOWEL OR 17X24 6PK STRL BLUE (TOWEL DISPOSABLE) ×3 IMPLANT
TOWEL OR 17X26 10 PK STRL BLUE (TOWEL DISPOSABLE) ×3 IMPLANT
WATER STERILE IRR 1000ML POUR (IV SOLUTION) ×3 IMPLANT

## 2018-03-19 NOTE — Patient Outreach (Signed)
Request received from Joanna Saportio, LCSW to mail patient personal care resources.  Information mailed today. 

## 2018-03-19 NOTE — Progress Notes (Signed)
1 black phone charger, and 1 white usb charger bagged, labeled and placed in chart.

## 2018-03-19 NOTE — Anesthesia Postprocedure Evaluation (Signed)
Anesthesia Post Note  Patient: Luke Manning  Procedure(s) Performed: AMPUTATION BELOW KNEE (Right Leg Lower)     Patient location during evaluation: PACU Anesthesia Type: General Level of consciousness: oriented, patient cooperative and sedated Pain management: pain level controlled Vital Signs Assessment: post-procedure vital signs reviewed and stable Respiratory status: spontaneous breathing, nonlabored ventilation, respiratory function stable and patient connected to nasal cannula oxygen Cardiovascular status: blood pressure returned to baseline and stable Postop Assessment: no apparent nausea or vomiting Anesthetic complications: no    Last Vitals:  Vitals:   03/19/18 1752 03/19/18 1807  BP: 127/77 (!) 142/82  Pulse: 93 91  Resp: 11 20  Temp:    SpO2: 99% 97%    Last Pain:  Vitals:   03/19/18 1757  TempSrc:   PainSc: Asleep        RLE Motor Response: Purposeful movement (03/19/18 1757) RLE Sensation: Full sensation (03/19/18 1757)      Erling CruzJACKSON,E. Milena Liggett

## 2018-03-19 NOTE — Progress Notes (Signed)
PROGRESS NOTE    Luke Manning  MVH:[id number] DOB: 06/02/1961 DOA: 03/15/2018 PCP: Jackie Plumsei-Bonsu, George, MD   Brief Narrative: 57 year old male with history of hypertension, hyperlipidemia, diabetes, coronary artery disease, iron deficiency anemia, peripheral vascular disease, status post right third toe amputation on February 2019 by Dr. Ophelia CharterYates presented with worsening right foot pain.  In the ER patient was found to have WBC of 21.6, elevated lactic acid level.  He was started on antibiotics and admitted for further evaluation.  Assessment & Plan:   1) Sepsis due to diabetic right foot wound infection/ gangrene: w Reina Fuse^WBC / fevers. Patient had surgery of right toe a month ago.  MRI of foot now showing cellulitis and myositis of the forefoot with soft tissue ulceration and subcutaneous emphysema.  -Evaluated by orthopedics and infectious disease.  Currently on cefepime and Flagyl. Blood cx's are negative, fevers improving, WBC worse .  Plan is for R BKA per ortho later today.  2) Pain management: Patient reported that his pain is currently controlled.  Continue oral oxycodone and IV morphine as needed for breakthrough pain management.  Continue MiraLAX.    3) Type 2 diabetes with renal manifestation: BS's well controlled.  Monitor blood sugar level.  Reduced the dose of Lantus 7 hs > to  5 hs preop.    4) Hypertension: Monitor blood pressure. BP's ok to slightly high. On amlodipine 5 qd  5) Hypokalemia: Improved, repleted magnesium for hypomagnesemia.  Repeat lab.   6) Seizure disorder: Continue Keppra, Ativan as needed and seizure precaution.  7) History of hyperlipidemia: Continue Lipitor.  8) Peripheral vascular disease: is sp angioplasty of right superficial femoral artery by vascular surgery on February 2019.  Hold asa/ plavix today pre-op.    9) Acute kidney injury: Improved.  Continue low rate of IV fluid.  Monitor BMP.  DVT prophylaxis: Heparin SQ Code Status: Full code Family  communication: No family at bedside Disposition Plan: Likely discharge in a few days    Vinson Moselleob Shiloh Southern MD [facility] pgr [phone] 03/19/2018, 2:18 PM  Consultants:   Orthopedics  Procedures: None Antimicrobials: - zosyn 3/23 x 1, dc'd - rocephin 3/23 - 3/25, dc'd - flagyl IV 3/23 >  - cefepime IV 3/26 >  Subjective: Seen and examined at bedside.  Patient is in good spirits, "ready for surgery" later today, says he thinks "maybe I should have had them take the whole foot the first time they operated".    Objective: Vitals:   03/18/18 0858 03/18/18 1429 03/18/18 2111 03/19/18 0556  BP: (!) 153/94 140/78 (!) 144/68 (!) 151/81  Pulse: (!) 105 (!) 108 (!) 106 (!) 105  Resp: 18 18 18  (!) 22  Temp:  98.6 F (37 C) 99.4 F (37.4 C) 98.3 F (36.8 C)  TempSrc:  Oral    SpO2: 100% 98% 99% 100%  Weight:      Height:        Intake/Output Summary (Last 24 hours) at 03/19/2018 [id number] Last data filed at 03/19/2018 [id number] Gross per 24 hour  Intake 3878.33 ml  Output -  Net 3878.33 ml   Filed Weights   03/15/18 1753 03/15/18 1837  Weight: 52.2 kg (115 lb) 52.2 kg (115 lb)    Examination:  General exam: Not in distress Respiratory system: Clear bilateral, respiratory for normal Cardiovascular system: Regular rate rhythm S1-S2 normal.  No pedal edema. Gastrointestinal system: Abdomen is nondistended, soft and nontender. Normal bowel sounds heard. Central nervous system: Alert awake and following  commands. Extremities: Right foot severe gangrene w tissue necrosis and surroinding edema/ erythema Skin: No rashes, lesions or ulcers Psychiatry: Judgement and insight appear normal. Mood & affect appropriate.     Data Reviewed: I have personally reviewed following labs and imaging studies  CBC: Recent Labs  Lab 03/15/18 1813 03/16/18 0228 03/17/18 0655 03/18/18 0854 03/19/18 0623  WBC 21.6* 19.1* 17.8* 21.7* 23.1*  NEUTROABS 17.7*  --   --   --   --   HGB  10.8* 10.8* 8.9* 9.2* 8.8*  HCT 33.3* 33.0* 26.7* 27.7* 26.3*  MCV 81.4 80.9 79.9 80.5 80.4  PLT 387 330 342 369 363   Basic Metabolic Panel: Recent Labs  Lab 03/15/18 1813 03/16/18 0228 03/17/18 0655 03/18/18 0516 03/19/18 0623  NA 133* 138 135 135 136  K 3.8 3.9 3.2* 3.9 3.4*  CL 97* 102 105 103 103  CO2 24 24 20* 23 21*  GLUCOSE 244* 155* 119* 125* 103*  BUN 20 20 11 9 11   CREATININE 1.50* 1.63* 1.16 1.06 0.98  CALCIUM 9.2 8.3* 7.9* 8.2* 8.0*  MG  --   --   --  1.6* 1.8   GFR: Estimated Creatinine Clearance: 60.7 mL/min (by C-G formula based on SCr of 0.98 mg/dL). Liver Function Tests: Recent Labs  Lab 03/15/18 1813  AST 21  ALT 11*  ALKPHOS 79  BILITOT 0.6  PROT 8.9*  ALBUMIN 3.0*   No results for input(s): LIPASE, AMYLASE in the last 168 hours. No results for input(s): AMMONIA in the last 168 hours. Coagulation Profile: Recent Labs  Lab 03/15/18 2130  INR 1.24   Cardiac Enzymes: No results for input(s): CKTOTAL, CKMB, CKMBINDEX, TROPONINI in the last 168 hours. BNP (last 3 results) No results for input(s): PROBNP in the last 8760 hours. HbA1C: No results for input(s): HGBA1C in the last 72 hours. CBG: Recent Labs  Lab 03/18/18 0806 03/18/18 1210 03/18/18 1715 03/18/18 2112 03/19/18 0738  GLUCAP 102* 121* 123* 97 98   Lipid Profile: No results for input(s): CHOL, HDL, LDLCALC, TRIG, CHOLHDL, LDLDIRECT in the last 72 hours. Thyroid Function Tests: No results for input(s): TSH, T4TOTAL, FREET4, T3FREE, THYROIDAB in the last 72 hours. Anemia Panel: No results for input(s): VITAMINB12, FOLATE, FERRITIN, TIBC, IRON, RETICCTPCT in the last 72 hours. Sepsis Labs: Recent Labs  Lab 03/15/18 1839 03/15/18 2130 03/15/18 2141 03/16/18 0021  PROCALCITON  --  0.16  --   --   LATICACIDVEN 2.15* 1.6 1.72 1.4    Recent Results (from the past 240 hour(s))  Culture, blood (routine x 2)     Status: None (Preliminary result)   Collection Time: 03/15/18   6:13 PM  Result Value Ref Range Status   Specimen Description BLOOD BLOOD RIGHT FOREARM  Final   Special Requests   Final    BOTTLES DRAWN AEROBIC AND ANAEROBIC Blood Culture adequate volume   Culture   Final    NO GROWTH 3 DAYS Performed at [facility], [address] [place], [place] [address]    Report Status PENDING  Incomplete  Culture, blood (routine x 2)     Status: None (Preliminary result)   Collection Time: 03/15/18  6:54 PM  Result Value Ref Range Status   Specimen Description BLOOD LEFT ANTECUBITAL  Final   Special Requests   Final    BOTTLES DRAWN AEROBIC AND ANAEROBIC Blood Culture results may not be optimal due to an excessive volume of blood received in culture bottles  Culture   Final    NO GROWTH 3 DAYS Performed at [facility], [address] [place], [place] [address]    Report Status PENDING  Incomplete         Radiology Studies: No results found.      Scheduled Meds: . amLODipine  5 mg Oral Daily  . aspirin EC  81 mg Oral Daily  . atorvastatin  20 mg Oral q1800  . clopidogrel  75 mg Oral Q breakfast  . docusate sodium  100 mg Oral BID  . ferrous sulfate  325 mg Oral BID WC  . folic acid  1 mg Oral Daily  . gabapentin  600 mg Oral BID  . heparin  5,000 Units Subcutaneous Q8H  . insulin aspart  0-9 Units Subcutaneous TID WC  . insulin glargine  5 Units Subcutaneous QHS  . levETIRAcetam  500 mg Oral BID  . multivitamin with minerals  1 tablet Oral Daily  . polyethylene glycol  17 g Oral Daily  . protein supplement shake  11 oz Oral BID BM   Continuous Infusions: . sodium chloride 50 mL/hr at 03/18/18 2335  . ceFEPime (MAXIPIME) IV Stopped (03/19/18 1914)  . metronidazole Stopped (03/19/18 0535)     LOS: 4 days     If 7PM-7AM, please contact night-coverage [email or web address] Password [facility] 03/19/2018, 8:07 AM

## 2018-03-19 NOTE — Anesthesia Procedure Notes (Signed)
Procedure Name: LMA Insertion Date/Time: 03/19/2018 4:25 PM Performed by: Jed LimerickHarder, Satoru Milich S, CRNA Pre-anesthesia Checklist: Patient identified, Emergency Drugs available, Suction available and Patient being monitored Patient Re-evaluated:Patient Re-evaluated prior to induction Oxygen Delivery Method: Circle System Utilized Preoxygenation: Pre-oxygenation with 100% oxygen Induction Type: IV induction Ventilation: Mask ventilation without difficulty LMA: LMA inserted LMA Size: 4.0 Number of attempts: 1 Placement Confirmation: positive ETCO2 Tube secured with: Tape Dental Injury: Teeth and Oropharynx as per pre-operative assessment

## 2018-03-19 NOTE — Care Management Note (Signed)
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         Luke Manning (Spouse)     [phone]           PCP: Jackie PlumGeorge Osei-Bonsu  Action/Plan: Plan BKA .Marland Kitchen.Marland Kitchen.Marland Kitchen.CM following for transitional care needs  Expected Discharge Date:                  Expected Discharge Plan:  Home w Home Health Services  In-House Referral:     Discharge planning Services  CM Consult  Post Acute Care Choice:  Home Health, Resumption of Svcs/PTA Provider Choice offered to:  Patient  DME Arranged:    DME Agency:     HH Arranged:    HH Agency:     Status of Service:  In process, will continue to follow  If discussed at Long Length of Stay Meetings, dates discussed:    Additional Comments:  Epifanio LeschesCole, Doni Bacha Hudson, RN 03/19/2018, 4:15 PM

## 2018-03-19 NOTE — Op Note (Signed)
Preop diagnosis: Diabetic right foot infection with subcutaneous air and skin necrosis, previous third toe amputation for gangrene.  Postop diagnosis: Same  Procedure: Right below-knee amputation  Surgeon: Annell GreeningMark Celina Shiley MD  Anesthesia General  Tourniquet: 350 x 24 minutes  Brief history 57 year old male with severe peripheral arterial disease diabetes since a teenager with PAD.  Previous angiography and balloon angioplasty for the femoral artery with one-vessel foot and severe arterial calcification.  After his balloon angioplasty underwent third toe amputation for necrotic toe and then return back to the hospital with necrosis on the lateral side of his foot subcutaneous air noted by MRI scan and necrosis of skin edges after his toe amputation.  He has been admitted to the hospital for sepsis and on IV antibiotics.  Procedure: After prepping and draping informed consent preoperative Ancef prophylaxis timeout procedure proximal thigh tourniquet was applied DuraPrep extremity sheets and drapes were applied.  The foot is been isolated with Ioban Steri-Drape starting at the ankle and covering the toes with the necrosis.  Patient had lateral foot gangrene.  Extremity sheets and drapes were applied split sheets.  Sterile skin marker was used starting 7 cm below the tibial tubercle transverse and then development of a posterior flap.  Leg was elevated tourniquet inflated skin was incised periosteum was elevated tibia was divided with a saw anterior edge was beveled.  Fibula was cut 2 cm proximal.  Anterior compartment muscles were divided arterial arteries were calcified and the posterior tibial artery had about 80% occlusion with calcified vessels.  After fibula was divided amputation knife was used to skive along the back of the tibia and fibula completing the amputation.  Foot was removed and sent to pathology.  Peroneal nerve posterior tibial nerve sural nerve were cut on stretch liner tract.  Superficial  peroneal nerve was cut on stretch allowed to retract.  Suture ligature ligation of vessels.  Tourniquet was deflated hemostasis obtained.  #1 Vicryl was used to suture the Achilles tendon back up to the anterior periosteum.  Anterior fascia was closed medial lateral with interrupted 0 Vicryl and #1 Vicryl sutures.  2-0 Vicryl subtendinous tissue.  2-0 nylon skin closure.  Xeroform 4 x 4's ABDs.  Stump was copiously irrigated prior to closure and there was good hemostasis.  Muscle contracted nicely.  4 x 4's ABD web roll Ace wrap and knee immobilizer was applied to protect the tip of the stump.

## 2018-03-19 NOTE — Anesthesia Preprocedure Evaluation (Signed)
Anesthesia Evaluation  Patient identified by MRN, date of birth, ID band Patient awake    Reviewed: Allergy & Precautions, NPO status , Patient's Chart, lab work & pertinent test results  History of Anesthesia Complications Negative for: history of anesthetic complications  Airway Mallampati: II  TM Distance: >3 FB Neck ROM: Full    Dental  (+) Poor Dentition, Dental Advisory Given, Chipped   Pulmonary neg pulmonary ROS,    breath sounds clear to auscultation       Cardiovascular hypertension, + Past MI (??) and + Peripheral Vascular Disease   Rhythm:Regular Rate:Normal     Neuro/Psych Seizures -,     GI/Hepatic negative GI ROS, Neg liver ROS,   Endo/Other  diabetes, Insulin Dependent, Oral Hypoglycemic Agents  Renal/GU Renal InsufficiencyRenal disease     Musculoskeletal   Abdominal   Peds  Hematology  (+) Blood dyscrasia (Hb 8.8), ,   Anesthesia Other Findings   Reproductive/Obstetrics                             Anesthesia Physical Anesthesia Plan  ASA: III  Anesthesia Plan: General   Post-op Pain Management:    Induction: Intravenous  PONV Risk Score and Plan: 3 and Ondansetron, Dexamethasone and Diphenhydramine  Airway Management Planned: LMA  Additional Equipment:   Intra-op Plan:   Post-operative Plan:   Informed Consent: I have reviewed the patients History and Physical, chart, labs and discussed the procedure including the risks, benefits and alternatives for the proposed anesthesia with the patient or authorized representative who has indicated his/her understanding and acceptance.   Dental advisory given  Plan Discussed with: CRNA and Surgeon  Anesthesia Plan Comments: (Plan routine monitors, GA- LMA OK)        Anesthesia Quick Evaluation

## 2018-03-19 NOTE — Transfer of Care (Signed)
Immediate Anesthesia Transfer of Care Note  Patient: Luke Manning  Procedure(s) Performed: AMPUTATION BELOW KNEE (Right Leg Lower)  Patient Location: PACU  Anesthesia Type:General  Level of Consciousness: drowsy  Airway & Oxygen Therapy: Patient Spontanous Breathing and Patient connected to nasal cannula oxygen  Post-op Assessment: Report given to RN and Post -op Vital signs reviewed and stable  Post vital signs: Reviewed and stable  Last Vitals:  Vitals Value Taken Time  BP    Temp    Pulse 93 03/19/2018  5:36 PM  Resp    SpO2 93 % 03/19/2018  5:36 PM  Vitals shown include unvalidated device data.  Last Pain:  Vitals:   03/19/18 1357  TempSrc: Oral  PainSc:       Patients Stated Pain Goal: 4 (03/19/18 1300)  Complications: No apparent anesthesia complications

## 2018-03-19 NOTE — Interval H&P Note (Signed)
History and Physical Interval Note:  03/19/2018 3:27 PM  Luke Manning  has presented today for surgery, with the diagnosis of rigth foot osteomylitis  The various methods of treatment have been discussed with the patient and family. After consideration of risks, benefits and other options for treatment, the patient has consented to  Procedure(s): AMPUTATION BELOW KNEE (Right) as a surgical intervention .  The patient's history has been reviewed, patient examined, no change in status, stable for surgery.  I have reviewed the patient's chart and labs.  Questions were answered to the patient's satisfaction.     Eldred MangesMark C Abdulai Blaylock

## 2018-03-20 ENCOUNTER — Encounter (HOSPITAL_COMMUNITY): Payer: Self-pay | Admitting: Orthopaedic Surgery

## 2018-03-20 DIAGNOSIS — M869 Osteomyelitis, unspecified: Secondary | ICD-10-CM

## 2018-03-20 DIAGNOSIS — Z89511 Acquired absence of right leg below knee: Secondary | ICD-10-CM

## 2018-03-20 DIAGNOSIS — E1169 Type 2 diabetes mellitus with other specified complication: Secondary | ICD-10-CM

## 2018-03-20 DIAGNOSIS — E1129 Type 2 diabetes mellitus with other diabetic kidney complication: Secondary | ICD-10-CM

## 2018-03-20 DIAGNOSIS — I96 Gangrene, not elsewhere classified: Secondary | ICD-10-CM

## 2018-03-20 DIAGNOSIS — E1152 Type 2 diabetes mellitus with diabetic peripheral angiopathy with gangrene: Secondary | ICD-10-CM

## 2018-03-20 DIAGNOSIS — L97519 Non-pressure chronic ulcer of other part of right foot with unspecified severity: Secondary | ICD-10-CM

## 2018-03-20 LAB — BASIC METABOLIC PANEL
Anion gap: 10 (ref 5–15)
BUN: 19 mg/dL (ref 6–20)
CALCIUM: 7.8 mg/dL — AB (ref 8.9–10.3)
CO2: 22 mmol/L (ref 22–32)
CREATININE: 1.06 mg/dL (ref 0.61–1.24)
Chloride: 105 mmol/L (ref 101–111)
GFR calc non Af Amer: >60 mL/min (ref >60)
Glucose, Bld: 199 mg/dL — ABNORMAL HIGH (ref 65–99)
Potassium: 3.8 mmol/L (ref 3.5–5.1)
Sodium: 137 mmol/L (ref 135–145)

## 2018-03-20 LAB — CULTURE, BLOOD (ROUTINE X 2)
CULTURE: NO GROWTH
CULTURE: NO GROWTH
Special Requests: ADEQUATE

## 2018-03-20 LAB — CBC
HCT: 26.4 % — ABNORMAL LOW (ref 39.0–52.0)
Hemoglobin: 8.6 g/dL — ABNORMAL LOW (ref 13.0–17.0)
MCH: 26.3 pg (ref 26.0–34.0)
MCHC: 32.6 g/dL (ref 30.0–36.0)
MCV: 80.7 fL (ref 78.0–100.0)
Platelets: 384 10*3/uL (ref 150–400)
RBC: 3.27 MIL/uL — ABNORMAL LOW (ref 4.22–5.81)
RDW: 16.2 % — AB (ref 11.5–15.5)
WBC: 10 10*3/uL (ref 4.0–10.5)

## 2018-03-20 LAB — GLUCOSE, CAPILLARY
GLUCOSE-CAPILLARY: 193 mg/dL — AB (ref 65–99)
GLUCOSE-CAPILLARY: 197 mg/dL — AB (ref 65–99)
Glucose-Capillary: 166 mg/dL — ABNORMAL HIGH (ref 65–99)
Glucose-Capillary: 214 mg/dL — ABNORMAL HIGH (ref 65–99)

## 2018-03-20 NOTE — Progress Notes (Signed)
PROGRESS NOTE    Luke Manning  ZOX:[id number] DOB: May 27, 1961 DOA: 03/15/2018 PCP: Jackie Plum, MD    Brief Narrative:  57 year old male who presented with right foot pain. Patient does have the significant past medical history for hypertension, dyslipidemia, type 2 diabetes mellitus, coronary disease, iron deficiency anemia, chronic kidney disease stage II, peripheral vascular disease and seizures. Patient had a recent angioplasty of the right superficial femoral artery and third toe amputation 02/14/2018. He was not able to take his oral antibiotics at home. He developed worsening right foot pain for several days prior to hospitalization, associated with nonhealing wound, and edema. On initial physical examination blood pressure 114/68, heart rate 89, respiratory 18, oxygen saturation 99%. Heart S1-S2 present, rhythmic, lungs are clear to auscultation bilaterally, abdomen was soft nontender, he had a nonhealing wound at the base of the third toe, with purulent discharge. Sodium 133, potassium 3.8, chloride 97, bicarbonate 24, glucose 244, BUN 20, creatinine 1.50, white count 21.6,hemoglobin 10.8, hematocrit 33.3, platelets 387. Urinalysis, specific gravity 1.012, glucose 50 0-5 rbc, 0-5 white cells. Right foot x-ray with postsurgical changes, air in the edges and soft tissues at the site of the third toe amputation. Right foot MRI with cellulitis and myositis of the forefoot, associated subcutaneous emphysema. EKG sinus rhythm, normal axis, normal intervals, positive septal Q waves.   Patient was admitted to the hospital with the working diagnosis of sepsis due to diabetic right foot infection.   Assessment & Plan:   Principal Problem:   Diabetic foot infection (HCC) Active Problems:   Type II diabetes mellitus with renal manifestations (HCC)   HTN (hypertension)   Seizure disorder (HCC)   Wound infection   HLD (hyperlipidemia)   PVD (peripheral vascular disease) (HCC)   CAD (coronary  artery disease)   Iron deficiency anemia   Acute renal failure superimposed on stage 2 chronic kidney disease (HCC)   Sepsis (HCC)   Cellulitis of right lower extremity   1. Sepsis due to diabetic foot wound infection/ gangrene. Sp below the knee amputation, will continue IV antibiotic therapy for 48 hours with cefepime, per surgery recommendations. Patient has been afebrile, pain is controlled, wbc at 10.0  2. T2DM. Will continue glucose cover and monitoring with insulin sliding scale, basal insulin with glargine at 5 units daily.   3. HTN. Blood pressure monitoring, continue amlodipine and as needed hydralazine, blood pressure systolic 132 mmHg.   4. Hypokalemia. K correction with po kcl, follow on renal panel in am. Serum K at 3,8. Renal function with serum cr at 1.06.   5. Seizure disorder. No seizure activity. Will continue levetiracetam and as needed lorazepam.   6. AKI. Renal function has improved, will continue to follow renal panel in am.   7. Peripheral vascular disease. Continue pain control, physical therapy evaluation. Will continue antiplatelet therapy with asa and clopidogrel. Continue statin therapy.    DVT prophylaxis: sq heparin  Code Status:  full Family Communication:  No family at the bedside Disposition Plan:  home   Consultants:    Orthopedic surgery  Procedures:     Antimicrobials:   cefepime    Subjective: Patient feeling better this am, pain with improved control, no nausea or vomiting, no chest pain or dyspnea, reported to be weak.   Objective: Vitals:   03/19/18 1859 03/19/18 2030 03/19/18 2120 03/20/18 0608  BP: 139/76 140/85 129/77 132/73  Pulse: 91 90 94 89  Resp: 18 18 17    Temp: 98.4 F (36.9 C) 98.5  F (36.9 C) 98.5 F (36.9 C) 98 F (36.7 C)  TempSrc: Oral Oral    SpO2: 100% 98% 98% 99%  Weight:      Height:        Intake/Output Summary (Last 24 hours) at 03/20/2018 0804 Last data filed at 03/20/2018 0610 Gross per 24  hour  Intake 800 ml  Output 750 ml  Net 50 ml   Filed Weights   03/15/18 1753 03/15/18 1837  Weight: 52.2 kg (115 lb) 52.2 kg (115 lb)    Examination:   General: Not in pain or dyspnea, deconditioned Neurology: Awake and alert, non focal  E ENT: mild pallor, no icterus, oral mucosa moist Cardiovascular: No JVD. S1-S2 present, rhythmic, no gallops, rubs, or murmurs. No lower extremity edema. Pulmonary: vesicular breath sounds bilaterally, adequate air movement, no wheezing, rhonchi or rales. Gastrointestinal. Abdomen with no organomegaly, non tender, no rebound or guarding Skin. Right lower extremity surgical wound with dressing on place.  Musculoskeletal: right lower extremity, sp amputation below the knee.     Data Reviewed: I have personally reviewed following labs and imaging studies  CBC: Recent Labs  Lab 03/15/18 1813 03/16/18 0228 03/17/18 0655 03/18/18 0854 03/19/18 0623 03/20/18 0407  WBC 21.6* 19.1* 17.8* 21.7* 23.1* 10.0  NEUTROABS 17.7*  --   --   --   --   --   HGB 10.8* 10.8* 8.9* 9.2* 8.8* 8.6*  HCT 33.3* 33.0* 26.7* 27.7* 26.3* 26.4*  MCV 81.4 80.9 79.9 80.5 80.4 80.7  PLT 387 330 342 369 363 384   Basic Metabolic Panel: Recent Labs  Lab 03/16/18 0228 03/17/18 0655 03/18/18 0516 03/19/18 0623 03/20/18 0407  NA 138 135 135 136 137  K 3.9 3.2* 3.9 3.4* 3.8  CL 102 105 103 103 105  CO2 24 20* 23 21* 22  GLUCOSE 155* 119* 125* 103* 199*  BUN 20 11 9 11 19   CREATININE 1.63* 1.16 1.06 0.98 1.06  CALCIUM 8.3* 7.9* 8.2* 8.0* 7.8*  MG  --   --  1.6* 1.8  --    GFR: Estimated Creatinine Clearance: 56.1 mL/min (by C-G formula based on SCr of 1.06 mg/dL). Liver Function Tests: Recent Labs  Lab 03/15/18 1813  AST 21  ALT 11*  ALKPHOS 79  BILITOT 0.6  PROT 8.9*  ALBUMIN 3.0*   No results for input(s): LIPASE, AMYLASE in the last 168 hours. No results for input(s): AMMONIA in the last 168 hours. Coagulation Profile: Recent Labs  Lab  03/15/18 2130  INR 1.24   Cardiac Enzymes: No results for input(s): CKTOTAL, CKMB, CKMBINDEX, TROPONINI in the last 168 hours. BNP (last 3 results) No results for input(s): PROBNP in the last 8760 hours. HbA1C: No results for input(s): HGBA1C in the last 72 hours. CBG: Recent Labs  Lab 03/19/18 0738 03/19/18 1227 03/19/18 1404 03/19/18 1737 03/19/18 2122  GLUCAP 98 93 96 88 150*   Lipid Profile: No results for input(s): CHOL, HDL, LDLCALC, TRIG, CHOLHDL, LDLDIRECT in the last 72 hours. Thyroid Function Tests: No results for input(s): TSH, T4TOTAL, FREET4, T3FREE, THYROIDAB in the last 72 hours. Anemia Panel: No results for input(s): VITAMINB12, FOLATE, FERRITIN, TIBC, IRON, RETICCTPCT in the last 72 hours.    Radiology Studies: I have reviewed all of the imaging during this hospital visit personally     Scheduled Meds: . amLODipine  5 mg Oral Daily  . aspirin EC  81 mg Oral Daily  . atorvastatin  20 mg Oral q1800  .  clopidogrel  75 mg Oral Q breakfast  . docusate sodium  100 mg Oral BID  . ferrous sulfate  325 mg Oral BID WC  . folic acid  1 mg Oral Daily  . gabapentin  600 mg Oral BID  . heparin  5,000 Units Subcutaneous Q8H  . insulin aspart  0-9 Units Subcutaneous TID WC  . insulin glargine  5 Units Subcutaneous QHS  . levETIRAcetam  500 mg Oral BID  . multivitamin with minerals  1 tablet Oral Daily  . polyethylene glycol  17 g Oral Daily  . protein supplement shake  11 oz Oral BID BM   Continuous Infusions: . sodium chloride 50 mL/hr at 03/19/18 2011  . ceFEPime (MAXIPIME) IV Stopped (03/20/18 4132)  . lactated ringers 50 mL/hr at 03/19/18 1440  . metronidazole Stopped (03/20/18 0505)     LOS: 5 days        Mauricio Annett Gula, MD [facility] Pager [phone]

## 2018-03-20 NOTE — [place] FL2 (Signed)
[place] MEDICAID FL2 LEVEL OF CARE SCREENING TOOL     IDENTIFICATION  Patient Name: Luke Manning Birthdate: Apr 29, 1961 Sex: male Admission Date (Current Location): 03/15/2018  [facility] and [place] Number:  Producer, television/film/video and Address:  [facility] [place] [address], [place], [place] [address]      Provider Number: [id number]  Attending Physician Name and Address:  Coralie Keens,*  Relative Name and Phone Number:  Adekunle Rohrbach, spouse, [phone]    Current Level of Care: Hospital Recommended Level of Care: Skilled Nursing Facility Prior Approval Number:    Date Approved/Denied:   PASRR Number: [id number]  Discharge Plan: SNF    Current Diagnoses: Patient Active Problem List   Diagnosis Date Noted  . Cellulitis of right lower extremity   . Wound infection 03/15/2018  . Diabetic foot infection (HCC) 03/15/2018  . HLD (hyperlipidemia) 03/15/2018  . PVD (peripheral vascular disease) (HCC) 03/15/2018  . CAD (coronary artery disease) 03/15/2018  . Iron deficiency anemia 03/15/2018  . Acute renal failure superimposed on stage 2 chronic kidney disease (HCC) 03/15/2018  . Sepsis (HCC) 03/15/2018  . Poorly controlled type 2 diabetes mellitus (HCC)   . Gangrene of toe of right foot (HCC) 02/12/2018  . Type II diabetes mellitus with renal manifestations (HCC) 02/12/2018  . HTN (hypertension) 02/12/2018  . Seizure disorder (HCC) 02/12/2018  . Ischemic pain of foot, right 02/11/2018  . Gangrene of foot (HCC) 02/11/2018    Orientation RESPIRATION BLADDER Height & Weight     Self, Situation, Time, Place  Normal Continent Weight: 52.2 kg (115 lb) Height:  5\' 2"  (157.5 cm)  BEHAVIORAL SYMPTOMS/MOOD NEUROLOGICAL BOWEL NUTRITION STATUS      Continent Diet(Please see DC Summary)  AMBULATORY STATUS COMMUNICATION OF NEEDS Skin   Extensive Assist Verbally Surgical wounds(Closed incision on leg)                       Personal Care  Assistance Level of Assistance  Bathing, Feeding, Dressing Bathing Assistance: Maximum assistance Feeding assistance: Independent Dressing Assistance: Limited assistance     Functional Limitations Info  Sight, Hearing, Speech Sight Info: Adequate Hearing Info: Adequate Speech Info: Adequate    SPECIAL CARE FACTORS FREQUENCY  PT (By licensed PT), OT (By licensed OT)     PT Frequency: 5x/week OT Frequency: 3x/week            Contractures Contractures Info: Not present    Additional Factors Info  Code Status, Allergies, Insulin Sliding Scale Code Status Info: Full Allergies Info: NKA   Insulin Sliding Scale Info: 3x daily with meals and at bedtime       Current Medications (03/20/2018):  This is the current hospital active medication list Current Facility-Administered Medications  Medication Dose Route Frequency Provider Last Rate Last Dose  . acetaminophen (TYLENOL) tablet 650 mg  650 mg Oral Q6H PRN Lorretta Harp, MD   650 mg at 03/20/18 0412  . amLODipine (NORVASC) tablet 5 mg  5 mg Oral Daily Lorretta Harp, MD   5 mg at 03/20/18 0908  . aspirin EC tablet 81 mg  81 mg Oral Daily Lorretta Harp, MD   81 mg at 03/20/18 0908  . atorvastatin (LIPITOR) tablet 20 mg  20 mg Oral q1800 Lorretta Harp, MD   20 mg at 03/18/18 1721  . ceFEPIme (MAXIPIME) 1 g in sodium chloride 0.9 % 100 mL IVPB  1 g Intravenous Q8H Hammons, Gerhard Munch, RPH  Stopped at 03/20/18 0607  . clopidogrel (PLAVIX) tablet 75 mg  75 mg Oral Q breakfast Lorretta HarpNiu, Xilin, MD   75 mg at 03/20/18 0902  . docusate sodium (COLACE) capsule 100 mg  100 mg Oral BID Lorretta HarpNiu, Xilin, MD   100 mg at 03/20/18 0902  . ferrous sulfate tablet 325 mg  325 mg Oral BID WC Lorretta HarpNiu, Xilin, MD   325 mg at 03/20/18 0902  . folic acid (FOLVITE) tablet 1 mg  1 mg Oral Daily Lorretta HarpNiu, Xilin, MD   1 mg at 03/20/18 0902  . gabapentin (NEURONTIN) tablet 600 mg  600 mg Oral BID Lorretta HarpNiu, Xilin, MD   600 mg at 03/20/18 0902  . heparin injection 5,000 Units  5,000 Units  Subcutaneous Alean RinneQ8H Niu, Xilin, MD   5,000 Units at 03/20/18 0534  . hydrALAZINE (APRESOLINE) injection 5 mg  5 mg Intravenous Q2H PRN Lorretta HarpNiu, Xilin, MD      . insulin aspart (novoLOG) injection 0-9 Units  0-9 Units Subcutaneous TID WC Lorretta HarpNiu, Xilin, MD   2 Units at 03/20/18 0919  . insulin glargine (LANTUS) injection 5 Units  5 Units Subcutaneous QHS Maxie BarbBhandari, Dron Prasad, MD   5 Units at 03/19/18 2125  . levETIRAcetam (KEPPRA) tablet 500 mg  500 mg Oral BID Lorretta HarpNiu, Xilin, MD   500 mg at 03/20/18 0902  . LORazepam (ATIVAN) injection 1 mg  1 mg Intravenous Q2H PRN Lorretta HarpNiu, Xilin, MD      . metoCLOPramide (REGLAN) tablet 5-10 mg  5-10 mg Oral Q8H PRN Naida Sleightwens, James M, PA-C       Or  . metoCLOPramide (REGLAN) injection 5-10 mg  5-10 mg Intravenous Q8H PRN Zonia Kiefwens, James M, PA-C      . metroNIDAZOLE (FLAGYL) IVPB 500 mg  500 mg Intravenous Alean RinneQ8H Niu, Xilin, MD   Stopped at 03/20/18 0505  . morphine 2 MG/ML injection 2 mg  2 mg Intravenous Q4H PRN Maxie BarbBhandari, Dron Prasad, MD   2 mg at 03/20/18 0102  . multivitamin with minerals tablet 1 tablet  1 tablet Oral Daily Maxie BarbBhandari, Dron Prasad, MD   1 tablet at 03/20/18 0902  . ondansetron (ZOFRAN) tablet 4 mg  4 mg Oral Q6H PRN Naida Sleightwens, James M, PA-C       Or  . ondansetron [facility](ZOFRAN) injection 4 mg  4 mg Intravenous Q6H PRN Naida Sleightwens, James M, PA-C      . oxyCODONE (Oxy IR/ROXICODONE) immediate release tablet 10 mg  10 mg Oral Q6H PRN Maxie BarbBhandari, Dron Prasad, MD   10 mg at 03/20/18 0901  . polyethylene glycol (MIRALAX / GLYCOLAX) packet 17 g  17 g Oral Daily Maxie BarbBhandari, Dron Prasad, MD   17 g at 03/20/18 0902  . protein supplement (PREMIER PROTEIN) liquid  11 oz Oral BID BM Maxie BarbBhandari, Dron Prasad, MD   11 oz at 03/20/18 0919  . tiZANidine (ZANAFLEX) tablet 4 mg  4 mg Oral Q8H PRN Lorretta HarpNiu, Xilin, MD   4 mg at 03/17/18 1141  . zolpidem (AMBIEN) tablet 5 mg  5 mg Oral QHS PRN Lorretta HarpNiu, Xilin, MD   5 mg at 03/18/18 2156     Discharge Medications: Please see discharge summary for a list of discharge  medications.  Relevant Imaging Results:  Relevant Lab Results:   Additional Information SSN: [id number]  Mearl LatinNadia S Flemon Kelty, LCSWA

## 2018-03-20 NOTE — Care Management Important Message (Signed)
Important Message  Patient Details  Name: Luke Manning MRN: [id number] Date of Birth: 01/15/1961   Medicare Important Message Given:  Yes    Zidane Renner 03/20/2018, 9:10 AM

## 2018-03-20 NOTE — Progress Notes (Signed)
CSW notified patient that the closest facility to his house that can accept him is [facility]. Patient agreeable. Will continue to follow for discharge date.   Osborne Cascoadia Nirvi Boehler LCSW [phone]

## 2018-03-20 NOTE — Clinical Social Work Note (Signed)
Clinical Social Work Assessment  Patient Details  Name: Luke Manning MRN: [id number] Date of Birth: 04/27/1961  Date of referral:  03/20/18               Reason for consult:  Facility Placement                Permission sought to share information with:  Facility Medical sales representativeContact Representative, Family Supports Permission granted to share information::  Yes, Verbal Permission Granted  Name::     Luke Manning  Agency::  SNFs  Relationship::  Spouse  Contact Information:  [phone]  Housing/Transportation Living arrangements for the past 2 months:  Skilled Nursing Facility Source of Information:  Patient Patient Interpreter Needed:  None Criminal Activity/Legal Involvement Pertinent to Current Situation/Hospitalization:  Yes Significant Relationships:  Spouse Lives with:  Spouse Do you feel safe going back to the place where you live?  No Need for family participation in patient care:  No (Coment)  Care giving concerns:  CSW received consult for possible SNF placement at time of discharge. CSW spoke with patient regarding PT recommendation of SNF placement at time of discharge. Patient reported that patient's spouse is currently unable to care for patient at their home given patient's current physical needs and fall risk. Patient expressed understanding of PT recommendation and is agreeable to SNF placement at time of discharge. CSW to continue to follow and assist with discharge planning needs.   Social Worker assessment / plan:  CSW spoke with patient concerning [facility] before [facility] home.  Employment status:  Retired Network engineernsurance information:  Managed Care PT Recommendations:  Skilled Nursing Facility Information / Referral to community resources:  Skilled Nursing Facility  Patient/Family's Response to care:  Patient recognizes need for rehab before returning home and [place]. Patient reported preference [place] in [place] so  his wife does not have to drive far. Patient aware that there will be a copay of $10-20/day at SNF if they are in network with his insurance.  Patient/Family's Understanding of and Emotional Response to Diagnosis, Current Treatment, and Prognosis:  Patient/family is realistic regarding therapy needs and expressed being hopeful for SNF placement. Patient expressed understanding of CSW role and discharge process as well as medical condition. No questions/concerns about plan or treatment.    Emotional Assessment Appearance:  Appears stated age Attitude/Demeanor/Rapport:  Engaged Affect (typically observed):  Accepting, Appropriate Orientation:  Oriented to Self, Oriented to Situation, Oriented to Place, Oriented to  Time Alcohol / Substance use:  Not Applicable Psych involvement (Current and /or in the community):  No (Comment)  Discharge Needs  Concerns to be addressed:  Care Coordination Readmission within the last 30 days:  Yes Current discharge risk:  Physical Impairment Barriers to Discharge:  Continued Medical Work up   Luke Manning, LCSWA 03/20/2018, 10:09 AM

## 2018-03-20 NOTE — Plan of Care (Signed)
  Problem: Pain Managment: Goal: General experience of comfort will improve Outcome: Progressing Note:  Pain managed with oxy and iv morphine prn Able to rest intermittently overnight    Problem: Safety: Goal: Ability to remain free from injury will improve Outcome: Progressing Note:  Bed alarm on educated patient on how to use call bell.   Problem: Skin Integrity: Goal: Risk for impaired skin integrity will decrease Outcome: Progressing Note:  Right BKA dressing remains clean dry intact Knee immobilizer remains in place

## 2018-03-20 NOTE — Progress Notes (Signed)
[facility]  Date of Admission:  03/15/2018     Total days of antibiotics 5   Day 4 cefepime            ASSESSMENT: 57 y.o. 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 His fevers and leukocytosis have resolved since amputation and amputation was well above level of infection providing cure for him.   PLAN: 1. Will stop metronidazole now that surgery is complete 2. Would give 1-2 more days of IV cefepime then stop.  3. Discharge once orthopedic / medical team feel he is ready.   Will sign off but available for further questions if needed.   Principal Problem:   Diabetic foot infection (HCC) Active Problems:   Type II diabetes mellitus with renal manifestations (HCC)   HTN (hypertension)   Seizure disorder (HCC)   Wound infection   HLD (hyperlipidemia)   PVD (peripheral vascular disease) (HCC)   CAD (coronary artery disease)   Iron deficiency anemia   Acute renal failure superimposed on stage 2 chronic kidney disease (HCC)   Sepsis (HCC)   Cellulitis of right lower extremity   . amLODipine  5 mg Oral Daily  . aspirin EC  81 mg Oral Daily  . atorvastatin  20 mg Oral q1800  . clopidogrel  75 mg Oral Q breakfast  . docusate sodium  100 mg Oral BID  . ferrous sulfate  325 mg Oral BID WC  . folic acid  1 mg Oral Daily  . gabapentin  600 mg Oral BID  . heparin  5,000 Units Subcutaneous Q8H  . insulin aspart  0-9 Units Subcutaneous TID WC  . insulin glargine  5 Units Subcutaneous QHS  . levETIRAcetam  500 mg Oral BID  . multivitamin with minerals  1 tablet Oral Daily  . polyethylene glycol  17 g Oral Daily  . protein supplement shake  11 oz Oral BID BM    SUBJECTIVE: Feeling so much better after amputation. Pain is much more tolerable and he actually feels like brushing his teeth today.   No Known Allergies  OBJECTIVE: Vitals:   03/19/18 1859  03/19/18 2030 03/19/18 2120 03/20/18 0608  BP: 139/76 140/85 129/77 132/73  Pulse: 91 90 94 89  Resp: 18 18 17    Temp: 98.4 F (36.9 C) 98.5 F (36.9 C) 98.5 F (36.9 C) 98 F (36.7 C)  TempSrc: Oral Oral    SpO2: 100% 98% 98% 99%  Weight:      Height:       Body mass index is 21.03 kg/m.  Physical Exam  Constitutional: He is oriented to person, place, and time and well-developed, well-nourished, and in no distress.  Up in chair brushing his teeth. Smiling.   HENT:  Mouth/Throat: No oral lesions. Normal dentition. No dental caries.  Eyes: No scleral icterus.  Cardiovascular: Regular rhythm and normal heart sounds. Tachycardia present.  Pulmonary/Chest: Effort normal and breath sounds normal.  Abdominal: Soft. He exhibits no distension. There is no tenderness.  Musculoskeletal:  Right leg wrapped. Clean and dry   Lymphadenopathy:    He has no cervical adenopathy.  Neurological: He is alert and oriented to person, place, and time.  Skin: Skin is warm and dry. No rash noted.  Psychiatric: Mood and affect normal.  Vitals reviewed.   Lab Results Lab Results  Component Value Date  WBC 10.0 03/20/2018   HGB 8.6 (L) 03/20/2018   HCT 26.4 (L) 03/20/2018   MCV 80.7 03/20/2018   PLT 384 03/20/2018    Lab Results  Component Value Date   CREATININE 1.06 03/20/2018   BUN 19 03/20/2018   NA 137 03/20/2018   K 3.8 03/20/2018   CL 105 03/20/2018   CO2 22 03/20/2018    Lab Results  Component Value Date   ALT 11 (L) 03/15/2018   AST 21 03/15/2018   ALKPHOS 79 03/15/2018   BILITOT 0.6 03/15/2018     Microbiology: BCx 3/23 >> no growth   Rexene Alberts, MSN, NP-C [facility] for Infectious Disease [facility] Cell: [phone] Pager: [phone]  03/20/2018  12:30 PM

## 2018-03-20 NOTE — Progress Notes (Signed)
03/20/18 1500  PT Visit Information  Assistance Needed +1  History of Present Illness 57 y.o. 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 PMH DM, HTN, CKD  Precautions  Precautions Fall  Precaution Comments R KI  Restrictions  Weight Bearing Restrictions No  RLE Weight Bearing NWB  Home Living  Family/patient expects to be discharged to: Private residence  Living Arrangements Spouse/significant other  Available Help at Discharge Family  Type of Home House  Home Access Stairs to enter  Entrance Stairs-Number of Steps 4  Entrance Stairs-Rails Can reach both  Home Layout One level  Bathroom Shower/Tub Walk-in Audiological scientist - single point;Grab bars - tub/shower  Prior Function  Level of Independence Independent with assistive device(s)  Comments uses cane  Communication  Communication No difficulties  Pain Assessment  Pain Assessment 0-10  Pain Score 4  Pain Location R LE  Pain Descriptors / Indicators Aching;Sore  Cognition  Arousal/Alertness Awake/alert  Behavior During Therapy WFL for tasks assessed/performed  Overall Cognitive Status Within Functional Limits for tasks assessed  Upper Extremity Assessment  Upper Extremity Assessment Generalized weakness  Lower Extremity Assessment  Lower Extremity Assessment Defer to PT evaluation  RLE Deficits / Details Decreased strength and ROM as expected post-operatively.   Cervical / Trunk Assessment  Cervical / Trunk Assessment Normal  Bed Mobility  General bed mobility comments OOB in chair  Transfers  Overall transfer level Needs assistance  Equipment used Rolling walker (2 wheeled)  Transfers Sit to/from Stand  Sit to Stand Min assist  General transfer comment cues for hand placement  Balance  Overall balance assessment Needs assistance  Sitting balance-Leahy Scale Fair  Standing balance-Leahy  Scale Poor  Exercises  Exercises Other exercises  Other Exercises  Other Exercises quad sets and  ankle pumps  PT - End of Session  Equipment Utilized During Treatment Gait belt  Activity Tolerance Patient tolerated treatment well  Patient left in chair;with call bell/phone within reach;with chair alarm set;with family/visitor present  Nurse Communication Mobility status  PT Assessment  PT Recommendation/Assessment Patient needs continued PT services  PT Visit Diagnosis Other abnormalities of gait and mobility (R26.89);Muscle weakness (generalized) (M62.81);Difficulty in walking, not elsewhere classified (R26.2)  Pain - Right/Left Right  Pain - part of body Ankle and joints of foot  PT Problem List Decreased strength;Decreased mobility;Decreased coordination;Decreased activity tolerance;Decreased balance;Pain  Barriers to Discharge Inaccessible home environment  Barriers to Discharge Comments unable to climb stairs in his home  PT Plan  PT Frequency (ACUTE ONLY) Min 3X/week  PT Treatment/Interventions (ACUTE ONLY) DME instruction;Therapeutic activities;Gait training;Therapeutic exercise;Patient/family education;Stair training;Balance training;Functional mobility training;Neuromuscular re-education  AM-PAC PT "6 Clicks" Daily Activity Outcome Measure  Difficulty turning over in bed (including adjusting bedclothes, sheets and blankets)? 4  Difficulty moving from lying on back to sitting on the side of the bed?  3  Difficulty sitting down on and standing up from a chair with arms (e.g., wheelchair, bedside commode, etc,.)? 3  Help needed moving to and from a bed to chair (including a wheelchair)? 3  Help needed walking in hospital room? 3  Help needed climbing 3-5 steps with a railing?  3  6 Click Score 19  Mobility G Code  CJ  PT Recommendation  Recommendations for Other Services Rehab consult  Individuals Consulted  Consulted and Agree with Results and Recommendations Patient  Acute  Rehab  PT Goals  Patient Stated Goal to walk again  PT Goal Formulation With patient  Time For Goal Achievement 04/03/18  Potential to Achieve Goals Good  PT Time Calculation  PT Start Time (ACUTE ONLY) 0914  PT Stop Time (ACUTE ONLY) 0944  PT Time Calculation (min) (ACUTE ONLY) 30 min  PT G-Codes **NOT FOR INPATIENT CLASS**  Functional Assessment Tool Used AM-PAC 6 Clicks Basic Mobility  PT General Charges  $$ ACUTE PT VISIT 1 Visit  PT Evaluation  $PT Eval Moderate Complexity 1 Mod  Written Expression  Dominant Hand Right    Samul Dadauth Davetta Olliff, PT MS Acute Rehab Dept. Number: [facility] [phone] and [facility] [phone]

## 2018-03-20 NOTE — Progress Notes (Signed)
   Subjective: 1 Day Post-Op Procedure(s) (LRB): AMPUTATION BELOW KNEE (Right) Patient reports pain as mild and moderate.    Objective: Vital signs in last 24 hours: Temp:  [98 F (36.7 C)-99.1 F (37.3 C)] 98 F (36.7 C) (03/28 82950608) Pulse Rate:  [89-105] 89 (03/28 0608) Resp:  [11-26] 17 (03/27 2120) BP: (115-146)/(71-86) 132/73 (03/28 0608) SpO2:  [91 %-100 %] 99 % (03/28 0608)  Intake/Output from previous day: 03/27 0701 - 03/28 0700 In: 800 [I.V.:800] Out: 750 [Urine:700; Blood:50] Intake/Output this shift: No intake/output data recorded.  Recent Labs    03/18/18 0854 03/19/18 0623 03/20/18 0407  HGB 9.2* 8.8* 8.6*   Recent Labs    03/19/18 0623 03/20/18 0407  WBC 23.1* 10.0  RBC 3.27* 3.27*  HCT 26.3* 26.4*  PLT 363 384   Recent Labs    03/19/18 0623 03/20/18 0407  NA 136 137  K 3.4* 3.8  CL 103 105  CO2 21* 22  BUN 11 19  CREATININE 0.98 1.06  GLUCOSE 103* 199*  CALCIUM 8.0* 7.8*   No results for input(s): LABPT, INR in the last 72 hours.  dressing dry No results found.  Assessment/Plan: 1 Day Post-Op Procedure(s) (LRB): AMPUTATION BELOW KNEE (Right) Up with therapy   Plan 48 hrs ABX then can be transferred to SNF vs home if he did well and was a safe ambulator. Would need W/C for home with elevated right leg, walker for home. Knee immobilizer can come off for knee ROM and is on to protect him in case he falls and lands on stump to prevent dehiscence.  More likely SNF based on generalized weakness.  My cell [phone]  Eldred MangesMark C Erdine Hulen 03/20/2018, 7:40 AM

## 2018-03-20 NOTE — Progress Notes (Addendum)
Occupational Therapy Evaluation Patient Details Name: Luke Manning MRN: [id number] DOB: 04/19/1961 Today's Date: 03/20/2018    History of Present Illness 57 y.o. 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 PMH DM, HTN, CKD   Clinical Impression   PTA, pt living at home with his wife and was modified independent with ADL and mobility. Pt making excellent progress. Able to take several hop steps to chair with min A @ RW level. Mod A with LB ADL. Pt will benefit from postacute rehab at SNF to facilitate safe DC home @ modified independent level. Will follow acutely to address established goals.    Follow Up Recommendations  Supervision/Assistance - 24 hour;SNF    Equipment Recommendations  3 in 1 bedside commode    Recommendations for Other Services       Precautions / Restrictions Precautions Precautions: Fall Precaution Comments: R KI Restrictions Weight Bearing Restrictions: No RLE Weight Bearing: Non weight bearing      Mobility Bed Mobility               General bed mobility comments; S with sit - supine. Pt managing RKI himself  Transfers Overall transfer level: Needs assistance Equipment used: Rolling walker (2 wheeled) Transfers: Sit to/from Stand Sit to Stand: Min assist         General transfer comment: cues for hand placement  Able to take several hop steps to bed    Balance Overall balance assessment: Needs assistance   Sitting balance-Leahy Scale: Fair       Standing balance-Leahy Scale: Poor                             ADL either performed or assessed with clinical judgement   ADL Overall ADL's : Needs assistance/impaired Eating/Feeding: Set up;Sitting   Grooming: Supervision/safety;Set up;Sitting   Upper Body Bathing: Set up;Sitting;Supervision/ safety   Lower Body Bathing: Minimal assistance;Sitting/lateral leans   Upper Body Dressing  : Sitting;Supervision/safety;Set up   Lower Body Dressing: Moderate assistance;Sit to/from stand;Sitting/lateral leans   Toilet Transfer: RW;Minimal assistance;Stand-pivot           Functional mobility during ADLs: Rolling walker;Minimal assistance;Cueing for safety  Began education on desensitization Pt discussed his difficulty with opiods and prefers to not take opiods to avoid "addiction"       Vision Patient Visual Report: No change from baseline Vision Assessment?: No apparent visual deficits     Perception     Praxis      Pertinent Vitals/Pain Pain Assessment: 0-10 Pain Score: 4  Pain Location: R LE Pain Descriptors / Indicators: Aching;Sore     Hand Dominance Right   Extremity/Trunk Assessment Upper Extremity Assessment Upper Extremity Assessment: Generalized weakness   Lower Extremity Assessment Lower Extremity Assessment: Defer to PT evaluation RLE Deficits / Details: Decreased strength and ROM as expected post-operatively.    Cervical / Trunk Assessment Cervical / Trunk Assessment: Normal   Communication Communication Communication: No difficulties   Cognition Arousal/Alertness: Awake/alert Behavior During Therapy: WFL for tasks assessed/performed Overall Cognitive Status: Within Functional Limits for tasks assessed                                     General Comments   Pt would be excellent CIR candidate but pt wants to find snf close  to home as his wife has limited transportation. Very motivated. Given contact number for neuro outpt to contact [facility]   Exercises Exercises: Other exercises Other Exercises Other Exercises: chair push ups x 10   Shoulder Instructions      Home Living Family/patient expects to be discharged to:: Private residence Living Arrangements: Spouse/significant other Available Help at Discharge: Family Type of Home: House Home Access: Stairs to enter Secretary/administratorntrance Stairs-Number of Steps:  4 Entrance Stairs-Rails: Can reach both Home Layout: One level     Bathroom Shower/Tub: Producer, television/film/videoWalk-in shower   Bathroom Toilet: Standard     Home Equipment: Cane - single point;Grab bars - tub/shower          Prior Functioning/Environment Level of Independence: Independent with assistive device(s)        Comments: uses cane        OT Problem List: Decreased strength;Decreased range of motion;Decreased activity tolerance;Impaired balance (sitting and/or standing);Decreased safety awareness;Decreased knowledge of use of DME or AE;Decreased knowledge of precautions;Pain      OT Treatment/Interventions: Self-care/ADL training;Therapeutic exercise;Energy conservation;DME and/or AE instruction;Therapeutic activities;Patient/family education;Balance training    OT Goals(Current goals can be found in the care plan section) Acute Rehab OT Goals Patient Stated Goal: to walk again OT Goal Formulation: With patient Time For Goal Achievement: 03/04/18 Potential to Achieve Goals: Good  OT Frequency: Min 2X/week   Barriers to D/C:            Co-evaluation              AM-PAC PT "6 Clicks" Daily Activity     Outcome Measure Help from another person eating meals?: None Help from another person taking care of personal grooming?: A Little Help from another person toileting, which includes using toliet, bedpan, or urinal?: A Little Help from another person bathing (including washing, rinsing, drying)?: A Little Help from another person to put on and taking off regular upper body clothing?: A Little Help from another person to put on and taking off regular lower body clothing?: A Lot 6 Click Score: 18   End of Session Equipment Utilized During Treatment: Other (comment);Rolling walker(R KI) Nurse Communication: Mobility status  Activity Tolerance: Patient tolerated treatment well Patient left: in chair;with call bell/phone within reach;with chair alarm set  OT Visit Diagnosis:  Other abnormalities of gait and mobility (R26.89);Pain;Muscle weakness (generalized) (M62.81) Pain - Right/Left: Right Pain - part of body: Leg                Time: 5621-30861513-1529 OT Time Calculation (min): 16 min Charges:  OT General Charges $OT Visit: 1 Visit OT Evaluation $OT Eval Moderate Complexity: 1 Mod G-Codes:     Harpreet Pompey, OT/L  [phone] 03/20/2018  Tobie Hellen,HILLARY 03/20/2018, 3:41 PM

## 2018-03-21 ENCOUNTER — Encounter (HOSPITAL_COMMUNITY): Payer: PPO

## 2018-03-21 LAB — GLUCOSE, CAPILLARY
GLUCOSE-CAPILLARY: 146 mg/dL — AB (ref 65–99)
GLUCOSE-CAPILLARY: 182 mg/dL — AB (ref 65–99)
GLUCOSE-CAPILLARY: 166 mg/dL — AB (ref 65–99)
Glucose-Capillary: 195 mg/dL — ABNORMAL HIGH (ref 65–99)

## 2018-03-21 LAB — BASIC METABOLIC PANEL
ANION GAP: 7 (ref 5–15)
BUN: 14 mg/dL (ref 6–20)
CALCIUM: 7.7 mg/dL — AB (ref 8.9–10.3)
CO2: 25 mmol/L (ref 22–32)
CREATININE: 0.89 mg/dL (ref 0.61–1.24)
Chloride: 105 mmol/L (ref 101–111)
GFR calc non Af Amer: >60 mL/min (ref >60)
Glucose, Bld: 131 mg/dL — ABNORMAL HIGH (ref 65–99)
Potassium: 3 mmol/L — ABNORMAL LOW (ref 3.5–5.1)
SODIUM: 137 mmol/L (ref 135–145)

## 2018-03-21 LAB — CBC WITH DIFFERENTIAL/PLATELET
BASOS ABS: 0 10*3/uL (ref 0.0–0.1)
Basophils Relative: 0 %
EOS ABS: 0 10*3/uL (ref 0.0–0.7)
Eosinophils Relative: 0 %
HCT: 25.2 % — ABNORMAL LOW (ref 39.0–52.0)
HEMOGLOBIN: 8 g/dL — AB (ref 13.0–17.0)
LYMPHS ABS: 2.7 10*3/uL (ref 0.7–4.0)
LYMPHS PCT: 18 %
MCH: 25.4 pg — ABNORMAL LOW (ref 26.0–34.0)
MCHC: 31.7 g/dL (ref 30.0–36.0)
MCV: 80 fL (ref 78.0–100.0)
MONOS PCT: 6 %
Monocytes Absolute: 0.9 10*3/uL (ref 0.1–1.0)
Neutro Abs: 11.5 10*3/uL — ABNORMAL HIGH (ref 1.7–7.7)
Neutrophils Relative %: 76 %
Platelets: 425 10*3/uL — ABNORMAL HIGH (ref 150–400)
RBC: 3.15 MIL/uL — AB (ref 4.22–5.81)
RDW: 16 % — AB (ref 11.5–15.5)
WBC: 15.1 10*3/uL — AB (ref 4.0–10.5)

## 2018-03-21 MED ORDER — POTASSIUM CHLORIDE CRYS ER 20 MEQ PO TBCR
40.0000 meq | EXTENDED_RELEASE_TABLET | ORAL | Status: AC
  Administered 2018-03-21 (×2): 40 meq via ORAL
  Filled 2018-03-21 (×2): qty 2

## 2018-03-21 NOTE — Progress Notes (Signed)
Patient ID: Luke Manning, male   DOB: 01/05/1961, [age] y.o.   MRN: [id number] Order placed for dressing change. 4X4 and 6 inch ace wrap . OK for PT walker ambulation/ transfers. Would slowly wean pain meds. Office 2wks for wound check placed in Discharge follow-up.   Will start stump shrinker sock when I see him back in office in 2 wks.

## 2018-03-21 NOTE — Consult Note (Signed)
   [facility] Consult   03/21/2018  Rob Hickmanlvin Culotta 01/02/1961 [id number]    [facility] follow [facility].   Spoke with Mr. Luke Manning at bedside. He endorses his discharge [facility] is [facility]. Discussed Clinical research associatewriter will make [facility] team aware of the updated disposition plans.   Appreciative of visit.   Will make [facility] aware of discharge plan.    Raiford NobleAtika Hall, MSN-Ed, RN,BSN [facility] Care Management Hospital Liaison [phone]

## 2018-03-21 NOTE — Progress Notes (Signed)
Physical Therapy Treatment Patient Details Name: Luke Manning MRN: [id number] DOB: 03/29/1961 Today's Date: 03/21/2018    History of Present Illness 57 y.o. 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 PMH DM, HTN, CKD    PT Comments    Pt performed multiple transfers from surface to surface.  Pt remains slow and guarded and limited due to bowel complications and increased standing trials due to bowel movement and need for pericare.  Plan next session to progress gait training.  Based on patient's presentation he would benefit from short stay at CIR to improve strength and function before returning home.  Informed supervising PT of patient's d/c plan based on safest option post amputation.     Follow Up Recommendations  Supervision/Assistance - 24 hour;CIR     Equipment Recommendations  Rolling walker with 5" wheels;3in1 (PT)    Recommendations for Other Services Rehab consult     Precautions / Restrictions Precautions Precautions: Fall Precaution Comments: R KI adjusted for comfort Restrictions Weight Bearing Restrictions: Yes RLE Weight Bearing: Non weight bearing    Mobility  Bed Mobility               General bed mobility comments: Pt in recliner on arrival.    Transfers Overall transfer level: Needs assistance Equipment used: Rolling walker (2 wheeled) Transfers: Sit to/from UGI CorporationStand;Stand Pivot Transfers Sit to Stand: Min assist Stand pivot transfers: Mod assist       General transfer comment: min assistance to boost in standing and steady balance in standing.  pt required cues for hand placement to and from seated surface.    Ambulation/Gait Ambulation/Gait assistance: Mod assist Ambulation Distance (Feet): 4 Feet(x2 trials.  ) Assistive device: Rolling walker (2 wheeled) Gait Pattern/deviations: Step-to pattern;Antalgic;Trunk flexed   Gait velocity interpretation:  Below normal speed for age/gender General Gait Details: Pt with hop to pattern to and from commode.  Pt required cues for sequencing and RW positioning to turn from surface to surface.     Stairs            Wheelchair Mobility    Modified Rankin (Stroke Patients Only)       Balance     Sitting balance-Leahy Scale: Fair       Standing balance-Leahy Scale: Poor                              Cognition Arousal/Alertness: Awake/alert Behavior During Therapy: WFL for tasks assessed/performed Overall Cognitive Status: Within Functional Limits for tasks assessed                                        Exercises Other Exercises Other Exercises: knee flexion and extension to R residual limb.      General Comments        Pertinent Vitals/Pain Pain Assessment: 0-10 Pain Score: 7  Pain Location: R LE and R PIV site due to burning.   Pain Descriptors / Indicators: Burning;Grimacing;Discomfort Pain Intervention(s): Monitored during session;Repositioned    Home Living                      Prior Function            PT Goals (current goals can now be found in the care plan  section) Acute Rehab PT Goals Patient Stated Goal: to walk again Potential to Achieve Goals: Good Progress towards PT goals: Progressing toward goals    Frequency    Min 3X/week      PT Plan Discharge plan needs to be updated    Co-evaluation              AM-PAC PT "6 Clicks" Daily Activity  Outcome Measure  Difficulty turning over in bed (including adjusting bedclothes, sheets and blankets)?: None Difficulty moving from lying on back to sitting on the side of the bed? : A Little Difficulty sitting down on and standing up from a chair with arms (e.g., wheelchair, bedside commode, etc,.)?: A Little Help needed moving to and from a bed to chair (including a wheelchair)?: A Little Help needed walking in hospital room?: A Little Help needed  climbing 3-5 steps with a railing? : A Little 6 Click Score: 19    End of Session Equipment Utilized During Treatment: Gait belt Activity Tolerance: Patient tolerated treatment well Patient left: in chair;with call bell/phone within reach;with chair alarm set;with family/visitor present Nurse Communication: Mobility status PT Visit Diagnosis: Other abnormalities of gait and mobility (R26.89);Muscle weakness (generalized) (M62.81);Difficulty in walking, not elsewhere classified (R26.2) Pain - Right/Left: Right Pain - part of body: Ankle and joints of foot     Time: 5784-6962 PT Time Calculation (min) (ACUTE ONLY): 30 min  Charges:  $Therapeutic Activity: 23-37 mins                    G CodesJoycelyn Rua, PTA pager [phone]    Florestine Avers 03/21/2018, 4:09 PM

## 2018-03-21 NOTE — Progress Notes (Addendum)
PROGRESS NOTE    Luke Manning  ZOX:[id number] DOB: 04/24/1961 DOA: 03/15/2018 PCP: Jackie Plumsei-Bonsu, George, MD    Brief Narrative:  57 year old male who presented with right foot pain. Patient does have the significant past medical history for hypertension, dyslipidemia, type 2 diabetes mellitus, coronary disease, iron deficiency anemia, chronic kidney disease stage II, peripheral vascular disease and seizures. Patient had a recent angioplasty of the right superficial femoral artery and third toe amputation 02/14/2018. He was not able to take his oral antibiotics at home. He developed worsening right foot pain for several days prior to hospitalization, associated with nonhealing wound, and edema. On initial physical examination blood pressure 114/68, heart rate 89, respiratory 18, oxygen saturation 99%. Heart S1-S2 present, rhythmic, lungs are clear to auscultation bilaterally, abdomen was soft nontender, he had a nonhealing wound at the base of the third toe, with purulent discharge. Sodium 133, potassium 3.8, chloride 97, bicarbonate 24, glucose 244, BUN 20, creatinine 1.50, white count 21.6,hemoglobin 10.8, hematocrit 33.3, platelets 387. Urinalysis, specific gravity 1.012, glucose 50 0-5 rbc, 0-5 white cells. Right foot x-ray with postsurgical changes, air in the edges and soft tissues at the site of the third toe amputation. Right foot MRI with cellulitis and myositis of the forefoot, associated subcutaneous emphysema. EKG sinus rhythm, normal axis, normal intervals, positive septal Q waves.   Patient was admitted to the hospital with the working diagnosis of sepsis due to diabetic right foot infection.  Assessment & Plan:   Principal Problem:   Diabetic foot infection (HCC) Active Problems:   Type II diabetes mellitus with renal manifestations (HCC)   HTN (hypertension)   Seizure disorder (HCC)   Wound infection   HLD (hyperlipidemia)   PVD (peripheral vascular disease) (HCC)   CAD (coronary  artery disease)   Iron deficiency anemia   Acute renal failure superimposed on stage 2 chronic kidney disease (HCC)   Sepsis (HCC)   Cellulitis of right lower extremity   1. Sepsis due to right diabetic foot wound infection/ gangrene. Sp right below the knee amputation. Plan to continue IV antibiotic therapy with cefepime to be completed on 03/22/18. Follow cell count and temperature curve. Plan to discharge to SNF on 03/23/18. Pain control with morphine, oxycodone and tizanidine.   2. T2DM. Glucose cover and monitoring with insulin sliding scale, basal insulin with glargine at 5 units daily. Capillary glucose 166, 214, 193, 197, 146.   3. HTN. Continue amlodipine for blood pressure control, systolic 130 to 160 mmHg.   4. Hypokalemia. This am serum K down to 3,0, will give 80 meq kcl po in 2 divided doses for correction correction. Follow on renal panel in am. Serum cr at 0.89.   5. Seizure disorder. On levetiracetam and as needed lorazepam. Controlled.   6. AKI. Has resolved with serum cr at 0.89, continue electrolyte correction. Serum bicarbonate at 25.   7. Peripheral vascular disease. On dual antiplatelet therapy with asa and clopidogrel, on atorvastatin.  Physical therapy.    DVT prophylaxis: sq heparin  Code Status:  full Family Communication:  No family at the bedside Disposition Plan:  SNF on 03/23/18   Consultants:    Orthopedic surgery  Procedures:     Antimicrobials:   cefepime     Subjective: Patient is feeling well, no nausea or vomiting, pain is well controlled, no chest pain or dyspnea.   Objective: Vitals:   03/20/18 1452 03/20/18 2139 03/21/18 0611 03/21/18 0848  BP: 138/79 140/76 135/83 (!) 162/84  Pulse: Marland Kitchen(!)  101 94 91 88  Resp:  20 18 16   Temp: 98.1 F (36.7 C) 98.9 F (37.2 C) 98 F (36.7 C) 98 F (36.7 C)  TempSrc: Oral   Oral  SpO2: 100% 98% 99% 100%  Weight:      Height:        Intake/Output Summary (Last 24 hours) at  03/21/2018 0940 Last data filed at 03/21/2018 0847 Gross per 24 hour  Intake 900 ml  Output 625 ml  Net 275 ml   Filed Weights   03/15/18 1753 03/15/18 1837  Weight: 52.2 kg (115 lb) 52.2 kg (115 lb)    Examination:   General: Not in pain or dyspnea, deconditioned Neurology: Awake and alert, non focal  E ENT: mild pallor, no icterus, oral mucosa moist Cardiovascular: No JVD. S1-S2 present, rhythmic, no gallops, rubs, or murmurs. No lower extremity edema. Pulmonary: vesicular breath sounds bilaterally, adequate air movement, no wheezing, rhonchi or rales. Gastrointestinal. Abdomen flat, no organomegaly, non tender, no rebound or guarding Skin. No rashes Musculoskeletal: no joint deformities/ below the knee amputation on the right.      Data Reviewed: I have personally reviewed following labs and imaging studies  CBC: Recent Labs  Lab 03/15/18 1813  03/17/18 0655 03/18/18 0854 03/19/18 0623 03/20/18 0407 03/21/18 0530  WBC 21.6*   < > 17.8* 21.7* 23.1* 10.0 15.1*  NEUTROABS 17.7*  --   --   --   --   --  11.5*  HGB 10.8*   < > 8.9* 9.2* 8.8* 8.6* 8.0*  HCT 33.3*   < > 26.7* 27.7* 26.3* 26.4* 25.2*  MCV 81.4   < > 79.9 80.5 80.4 80.7 80.0  PLT 387   < > 342 369 363 384 425*   < > = values in this interval not displayed.   Basic Metabolic Panel: Recent Labs  Lab 03/17/18 0655 03/18/18 0516 03/19/18 0623 03/20/18 0407 03/21/18 0530  NA 135 135 136 137 137  K 3.2* 3.9 3.4* 3.8 3.0*  CL 105 103 103 105 105  CO2 20* 23 21* 22 25  GLUCOSE 119* 125* 103* 199* 131*  BUN 11 9 11 19 14   CREATININE 1.16 1.06 0.98 1.06 0.89  CALCIUM 7.9* 8.2* 8.0* 7.8* 7.7*  MG  --  1.6* 1.8  --   --    GFR: Estimated Creatinine Clearance: 66.8 mL/min (by C-G formula based on SCr of 0.89 mg/dL). Liver Function Tests: Recent Labs  Lab 03/15/18 1813  AST 21  ALT 11*  ALKPHOS 79  BILITOT 0.6  PROT 8.9*  ALBUMIN 3.0*   No results for input(s): LIPASE, AMYLASE in the last 168  hours. No results for input(s): AMMONIA in the last 168 hours. Coagulation Profile: Recent Labs  Lab 03/15/18 2130  INR 1.24   Cardiac Enzymes: No results for input(s): CKTOTAL, CKMB, CKMBINDEX, TROPONINI in the last 168 hours. BNP (last 3 results) No results for input(s): PROBNP in the last 8760 hours. HbA1C: No results for input(s): HGBA1C in the last 72 hours. CBG: Recent Labs  Lab 03/20/18 0808 03/20/18 1217 03/20/18 1703 03/20/18 2137 03/21/18 0759  GLUCAP 166* 214* 193* 197* 146*   Lipid Profile: No results for input(s): CHOL, HDL, LDLCALC, TRIG, CHOLHDL, LDLDIRECT in the last 72 hours. Thyroid Function Tests: No results for input(s): TSH, T4TOTAL, FREET4, T3FREE, THYROIDAB in the last 72 hours. Anemia Panel: No results for input(s): VITAMINB12, FOLATE, FERRITIN, TIBC, IRON, RETICCTPCT in the last 72 hours.  Radiology Studies: I have reviewed all of the imaging during this hospital visit personally     Scheduled Meds: . amLODipine  5 mg Oral Daily  . aspirin EC  81 mg Oral Daily  . atorvastatin  20 mg Oral q1800  . clopidogrel  75 mg Oral Q breakfast  . docusate sodium  100 mg Oral BID  . ferrous sulfate  325 mg Oral BID WC  . folic acid  1 mg Oral Daily  . gabapentin  600 mg Oral BID  . heparin  5,000 Units Subcutaneous Q8H  . insulin aspart  0-9 Units Subcutaneous TID WC  . insulin glargine  5 Units Subcutaneous QHS  . levETIRAcetam  500 mg Oral BID  . multivitamin with minerals  1 tablet Oral Daily  . polyethylene glycol  17 g Oral Daily  . protein supplement shake  11 oz Oral BID BM   Continuous Infusions: . ceFEPime (MAXIPIME) IV Stopped (03/21/18 0534)     LOS: 6 days        Luke Annett Gula, MD [facility] Pager [phone]

## 2018-03-21 NOTE — Progress Notes (Addendum)
Patient has insurance authorization to discharge [facility] when stable. They are hopeful to have a bed available over the weekend.   Osborne Cascoadia Trystan Akhtar LCSW [phone]

## 2018-03-21 NOTE — Progress Notes (Signed)
Assumed care of pt from off going nurse. No changes in assessment. Condition stable at this time 

## 2018-03-22 LAB — CBC WITH DIFFERENTIAL/PLATELET
BASOS ABS: 0 10*3/uL (ref 0.0–0.1)
Basophils Relative: 0 %
EOS ABS: 0.2 10*3/uL (ref 0.0–0.7)
Eosinophils Relative: 2 %
HCT: 25 % — ABNORMAL LOW (ref 39.0–52.0)
HEMOGLOBIN: 7.9 g/dL — AB (ref 13.0–17.0)
LYMPHS PCT: 23 %
Lymphs Abs: 2.9 10*3/uL (ref 0.7–4.0)
MCH: 25.3 pg — ABNORMAL LOW (ref 26.0–34.0)
MCHC: 31.6 g/dL (ref 30.0–36.0)
MCV: 80.1 fL (ref 78.0–100.0)
MONOS PCT: 6 %
Monocytes Absolute: 0.7 10*3/uL (ref 0.1–1.0)
NEUTROS PCT: 69 %
Neutro Abs: 8.6 10*3/uL — ABNORMAL HIGH (ref 1.7–7.7)
Platelets: 441 10*3/uL — ABNORMAL HIGH (ref 150–400)
RBC: 3.12 MIL/uL — AB (ref 4.22–5.81)
RDW: 16.3 % — ABNORMAL HIGH (ref 11.5–15.5)
WBC: 12.4 10*3/uL — AB (ref 4.0–10.5)

## 2018-03-22 LAB — BASIC METABOLIC PANEL
ANION GAP: 8 (ref 5–15)
BUN: 14 mg/dL (ref 6–20)
CHLORIDE: 104 mmol/L (ref 101–111)
CO2: 25 mmol/L (ref 22–32)
Calcium: 8.1 mg/dL — ABNORMAL LOW (ref 8.9–10.3)
Creatinine, Ser: 0.84 mg/dL (ref 0.61–1.24)
GFR calc non Af Amer: >60 mL/min (ref >60)
Glucose, Bld: 102 mg/dL — ABNORMAL HIGH (ref 65–99)
Potassium: 3.4 mmol/L — ABNORMAL LOW (ref 3.5–5.1)
SODIUM: 137 mmol/L (ref 135–145)

## 2018-03-22 LAB — GLUCOSE, CAPILLARY
GLUCOSE-CAPILLARY: 267 mg/dL — AB (ref 65–99)
Glucose-Capillary: 104 mg/dL — ABNORMAL HIGH (ref 65–99)
Glucose-Capillary: 158 mg/dL — ABNORMAL HIGH (ref 65–99)
Glucose-Capillary: 111 mg/dL — ABNORMAL HIGH (ref 65–99)

## 2018-03-22 MED ORDER — INSULIN DETEMIR 100 UNIT/ML ~~LOC~~ SOLN: 5.0000 [IU] | Freq: Every day | SUBCUTANEOUS | 11 refills | Status: AC

## 2018-03-22 MED ORDER — POLYETHYLENE GLYCOL 3350 17 G PO PACK: 17.0000 g | PACK | Freq: Every day | ORAL | 0 refills | Status: AC

## 2018-03-22 MED ORDER — INSULIN ASPART 100 UNIT/ML ~~LOC~~ SOLN: 0.0000 [IU] | Freq: Three times a day (TID) | SUBCUTANEOUS | 11 refills | Status: AC

## 2018-03-22 MED ORDER — ADULT MULTIVITAMIN W/MINERALS CH: 1.0000 | ORAL_TABLET | Freq: Every day | ORAL | 0 refills | Status: AC

## 2018-03-22 MED ORDER — PREMIER PROTEIN SHAKE: 11.0000 [oz_av] | Freq: Two times a day (BID) | ORAL | 0 refills | Status: DC

## 2018-03-22 MED ORDER — OXYCODONE HCL 10 MG PO TABS: 10.0000 mg | ORAL_TABLET | Freq: Four times a day (QID) | ORAL | 0 refills | Status: DC | PRN

## 2018-03-22 NOTE — Discharge Summary (Signed)
Physician Discharge Summary  Luke Manning UEA:[id number] DOB: 07/26/1961 DOA: 03/15/2018  PCP: Jackie Plum, MD  Admit date: 03/15/2018 Discharge date: 03/22/2018  Admitted From: Home Disposition:  SNF  Recommendations for Outpatient Follow-up and new medication changes:  1. Follow up with PCP in 1-week 2. Patient completed antibiotic therapy in the hospital 3. Dressing change. 4X4 and 6 inch ace wrap . OK for PT walker ambulation/ transfers. 4. Orthopedic office follow up in 2wks for wound check    Home Health: no  Equipment/Devices: waker   Discharge Condition: stable CODE STATUS: full  Diet recommendation: Heart healthy and diabetic prudent.   Brief/Interim Summary: 57 year old male who presented with right foot pain. Patient does have the significant past medical history for hypertension, dyslipidemia, type 2 diabetes mellitus, coronary artery disease, iron deficiency anemia, chronic kidney disease stage II, peripheral vascular disease and seizures. Patient had a recent angioplasty of the right superficial femoral artery and third toe amputation 02/14/2018. He was not able to take his oral antibiotics at home. He developed worsening right foot pain for several days prior to hospitalization, associated with nonhealing wound, and edema. On initial physical examination blood pressure 114/68, heart rate 89, respiratory rate 18, oxygen saturation 99%. Heart S1-S2 present, rhythmic, lungs were clear to auscultation bilaterally, abdomen was soft nontender, he had a right nonhealing wound at the base of the third toe, with purulent discharge. Sodium 133, potassium 3.8, chloride 97, bicarbonate 24, glucose 244, BUN 20, creatinine 1.50, white count 21.6, hemoglobin 10.8, hematocrit 33.3, platelets 387. Urinalysis, specific gravity 1.012, glucose 50, 0-5 rbc, 0-5 white cells. Right foot x-ray with postsurgical changes, air in the edges and soft tissues at the site of the third toe amputation. Right  foot MRI with cellulitis and myositis of the forefoot, associated subcutaneous emphysema. EKG sinus rhythm, normal axis, normal intervals, positive septal Q waves.   Patient was admitted to the hospital with the working diagnosis of sepsis due to diabetic right foot infection.  1. Sepsis due to right diabetic foot wound infection/gangrene. Patient was admitted to the medical ward, he was placed on broad spectrum IV antibiotic therapy. Patient was seen by orthopedic surgery, and patient underwent below-knee amputation with no major complications. Patient was continued on antibiotic therapy for 48 hours after surgical intervention. Discharge white cell count 12.4, his blood cultures have remained negative.  2. Type 2 diabetes mellitus. Patient was placed on insulin sliding scale for glucose coverage and monitoring, plus a basal insulin regimen with glargine 5 units daily. His capillary glucose has remained stable over last few days of his hospitalization. 146, 182, 166, 195, 104.   3. Hypertension. Patient was continued on amlodipine for blood pressure control.  4. Acute kidney injury withHypokalemia. Electrolytes were corrected,discharge potassium 3.4, serum bicarbonate 25, renal function improved with a discharge serum creatinine of 0.84.   5. Peripheral vascular disease. Patient was continued on dual antiplatelet therapy with aspirin and clopidogrel, continue atorvastatin.  6. Seizure disorder. Patient was continued on levetiracetam, no decompensation.     Discharge Diagnoses:  Principal Problem:   Diabetic foot infection (HCC) Active Problems:   Type II diabetes mellitus with renal manifestations (HCC)   HTN (hypertension)   Seizure disorder (HCC)   Wound infection   HLD (hyperlipidemia)   PVD (peripheral vascular disease) (HCC)   CAD (coronary artery disease)   Iron deficiency anemia   Acute renal failure superimposed on stage 2 chronic kidney disease (HCC)   Sepsis (HCC)    Cellulitis  of right lower extremity    Discharge Instructions  Discharge Instructions    AMB Referral to [facility] Care Management   Complete by:  As directed    Please assign HTA member to [facility] for transition of care. Please assign to [facility] LCSW for transportation needs. Written consent obtained. Currently at [facility]. Please see hospital liaison notes on 3/25 for contact information as well. Please call with questions. Thanks. Raiford Noble, MSN-Ed, RN,BSN-THN [facility] Liaison-[phone]   Reason for consult:  Please assign to [facility] RNCM and [facility] LCSW   Diagnoses of:  Diabetes   Expected date of contact:  1-3 days (reserved for hospital discharges)     Allergies as of 03/22/2018   No Known Allergies     Medication List    STOP taking these medications   insulin glargine 100 UNIT/ML injection Commonly known as:  LANTUS Replaced by:  insulin detemir 100 UNIT/ML injection   oxyCODONE-acetaminophen 10-325 MG tablet Commonly known as:  PERCOCET   SYRINGE 6CC/20GX1" 20G X 1" 6 ML Misc     TAKE these medications   amLODipine 5 MG tablet Commonly known as:  NORVASC Take 5 mg by mouth daily.   aspirin 81 MG EC tablet Take 1 tablet (81 mg total) by mouth daily.   atorvastatin 20 MG tablet Commonly known as:  LIPITOR Take 1 tablet (20 mg total) by mouth daily at 6 PM.   clopidogrel 75 MG tablet Commonly known as:  PLAVIX Take 1 tablet (75 mg total) by mouth daily with breakfast.   docusate sodium 100 MG capsule Commonly known as:  COLACE Take 1 capsule (100 mg total) by mouth 2 (two) times daily.   feeding supplement (ENSURE ENLIVE) Liqd Take 237 mLs by mouth 2 (two) times daily between meals.   ferrous sulfate 325 (65 FE) MG tablet Take 1 tablet (325 mg total) by mouth 2 (two) times daily with a meal.   folic acid 1 MG tablet Commonly known as:  FOLVITE Take 1 mg by mouth daily.   gabapentin 600 MG tablet Commonly known as:  NEURONTIN Take  600 mg by mouth 2 (two) times daily.   insulin aspart 100 UNIT/ML injection Commonly known as:  novoLOG Inject 0-9 Units into the skin 3 (three) times daily with meals. For glucose 121-150 use 1 unit, for 151-200 use 2 units, 201-250 use 3 units, for 251-300 use 5 units, for 301-350 use 7 units, for 351 or greater use 9 units.   insulin detemir 100 UNIT/ML injection Commonly known as:  LEVEMIR Inject 0.05 mLs (5 Units total) into the skin at bedtime. Replaces:  insulin glargine 100 UNIT/ML injection   levETIRAcetam 500 MG tablet Commonly known as:  KEPPRA Take 500 mg by mouth 2 (two) times daily.   metFORMIN 500 MG tablet Commonly known as:  GLUCOPHAGE Take 1,000 mg by mouth daily with breakfast.   multivitamin with minerals Tabs tablet Take 1 tablet by mouth daily. Start taking on:  03/23/2018   Oxycodone HCl 10 MG Tabs Take 1 tablet (10 mg total) by mouth every 6 (six) hours as needed for moderate pain. What changed:    medication strength  how much to take  when to take this   polyethylene glycol packet Commonly known as:  MIRALAX / GLYCOLAX Take 17 g by mouth daily. Start taking on:  03/23/2018   protein supplement shake Liqd Commonly known as:  PREMIER PROTEIN Take 325 mLs (11 oz total) by mouth  2 (two) times daily between meals.   tiZANidine 4 MG tablet Commonly known as:  ZANAFLEX TAKE 2 TABLETS BY MOUTH 3 TIMES A DAY AS NEEDED      Follow-up Information    Eldred Manges, MD Follow up in 2 week(s).   Specialty:  Orthopedic Surgery Contact information: [address] [place] [address] [phone]          No Known Allergies  Consultations:  Orthopedic surgery   Procedures/Studies: Mr Foot Right Wo Contrast  Result Date: 03/15/2018 CLINICAL DATA:  Ulceration along the lateral aspect of the forefoot adjacent to the fifth metatarsal. Recent amputation of the third toe 1 month ago. Diabetes. EXAM: MRI OF THE RIGHT FOREFOOT WITHOUT  CONTRAST TECHNIQUE: Multiplanar, multisequence MR imaging of the right forefoot was performed. No intravenous contrast was administered. COMPARISON:  Radiographs from the same day FINDINGS: Bones/Joint/Cartilage Maintained cortices without bone destruction or fracture. Minor reactive marrow edema of the lateral aspect of the fifth metatarsal head and neck and base of fifth metatarsal. No joint dislocation or effusions. Status post amputation of the third toe at the MTP articulation. Mild joint space narrowing, spurring and subchondral cystic change across the talonavicular joint with minor reactive edema along the plantar aspect of the navicular. Mild cuboid reactive edema without cortical bone loss. Ligaments Negative Muscles and Tendons Myositis of the plantar muscles of the forefoot. The flexor and extensor tendons crossing the forefoot demonstrate no acute tear or significant tenosynovitis. Soft tissues Diffuse soft tissue swelling of the forefoot without focal fluid collections. Subcutaneous emphysema is noted of the forefoot. Soft tissue ulceration along the lateral aspect of the forefoot likely accounts for the emphysema. IMPRESSION: 1. Cellulitis and myositis of the forefoot with soft tissue ulceration adjacent to the lateral aspect of the fifth metatarsal head. Associated subcutaneous emphysema is seen of the forefoot. 2. Reactive marrow edema is noted without cortical bone loss involving the distal fifth metatarsal, proximal fifth phalanx and cuboid. 3. Status post amputation of the third toe. Electronically Signed   By: Tollie Eth M.D.   On: 03/15/2018 23:22   Dg Foot Complete Right  Result Date: 03/15/2018 CLINICAL DATA:  Right foot pain after toe amputation a couple weeks ago. EXAM: RIGHT FOOT COMPLETE - 3+ VIEW COMPARISON:  02/11/2018 FINDINGS: Interval amputation of the third toe distal to the head of the metatarsal. Minimal associated air in the soft tissues. Small vessel atherosclerotic disease  is present. There is mild degenerative change over the midfoot. There is a small inferior calcaneal spur. IMPRESSION: Postsurgical change compatible with amputation of the third toe distal to the head of the metatarsal. Air in the adjacent soft tissues which may be postsurgical although cannot exclude infection. Electronically Signed   By: Elberta Fortis M.D.   On: 03/15/2018 18:58       Subjective: Patient is feeling well, no nausea or vomiting, tolerating po well.   Discharge Exam: Vitals:   03/21/18 2049 03/22/18 0552  BP: 129/73 (!) 145/87  Pulse: 100 94  Resp: 17 18  Temp: 98.4 F (36.9 C) 98.2 F (36.8 C)  SpO2: 100% 100%   Vitals:   03/21/18 1414 03/21/18 1622 03/21/18 2049 03/22/18 0552  BP: 134/87 114/72 129/73 (!) 145/87  Pulse: (!) 105 83 100 94  Resp: 18 20 17 18   Temp: 98 F (36.7 C) 98 F (36.7 C) 98.4 F (36.9 C) 98.2 F (36.8 C)  TempSrc: Oral Oral    SpO2: 100% 100%  100% 100%  Weight:      Height:        General: Not in pain or dyspnea Neurology: Awake and alert, non focal  E ENT: no pallor, no icterus, oral mucosa moist Cardiovascular: No JVD. S1-S2 present, rhythmic, no gallops, rubs, or murmurs. No lower extremity edema. Pulmonary: vesicular breath sounds bilaterally, adequate air movement, no wheezing, rhonchi or rales. Gastrointestinal. Abdomen flat, no organomegaly, non tender, no rebound or guarding Skin. No rashes Musculoskeletal: no joint deformities/ right lower extremity with dressing in place,    The results of significant diagnostics from this hospitalization (including imaging, microbiology, ancillary and laboratory) are listed below for reference.     Microbiology: Recent Results (from the past 240 hour(s))  Culture, blood (routine x 2)     Status: None   Collection Time: 03/15/18  6:13 PM  Result Value Ref Range Status   Specimen Description BLOOD BLOOD RIGHT FOREARM  Final   Special Requests   Final    BOTTLES DRAWN AEROBIC AND  ANAEROBIC Blood Culture adequate volume   Culture   Final    NO GROWTH 5 DAYS Performed at [facility], [address] [place], [place] [address]    Report Status 03/20/2018 FINAL  Final  Culture, blood (routine x 2)     Status: None   Collection Time: 03/15/18  6:54 PM  Result Value Ref Range Status   Specimen Description BLOOD LEFT ANTECUBITAL  Final   Special Requests   Final    BOTTLES DRAWN AEROBIC AND ANAEROBIC Blood Culture results may not be optimal due to an excessive volume of blood received in culture bottles   Culture   Final    NO GROWTH 5 DAYS Performed at [facility], [address] [place], [place] [address]    Report Status 03/20/2018 FINAL  Final  Surgical pcr screen     Status: None   Collection Time: 03/19/18  6:18 AM  Result Value Ref Range Status   MRSA, PCR NEGATIVE NEGATIVE Final   Staphylococcus aureus NEGATIVE NEGATIVE Final    Comment: (NOTE) The Xpert SA Assay (FDA approved for NASAL specimens in patients 9 years of age and older), is one component of a comprehensive surveillance program. It is not intended to diagnose infection nor to guide or monitor treatment. Performed at [facility], [address] [place], [place] [address]      Labs: BNP (last 3 results) No results for input(s): BNP in the last 8760 hours. Basic Metabolic Panel: Recent Labs  Lab 03/18/18 0516 03/19/18 0623 03/20/18 0407 03/21/18 0530 03/22/18 0602  NA 135 136 137 137 137  K 3.9 3.4* 3.8 3.0* 3.4*  CL 103 103 105 105 104  CO2 23 21* 22 25 25   GLUCOSE 125* 103* 199* 131* 102*  BUN 9 11 19 14 14   CREATININE 1.06 0.98 1.06 0.89 0.84  CALCIUM 8.2* 8.0* 7.8* 7.7* 8.1*  MG 1.6* 1.8  --   --   --    Liver Function Tests: Recent Labs  Lab 03/15/18 1813  AST 21  ALT 11*  ALKPHOS 79  BILITOT 0.6  PROT 8.9*  ALBUMIN 3.0*   No results for input(s): LIPASE, AMYLASE in the last 168 hours. No results for input(s): AMMONIA in the last 168  hours. CBC: Recent Labs  Lab 03/15/18 1813  03/18/18 0854 03/19/18 0623 03/20/18 0407 03/21/18 0530 03/22/18 0602  WBC 21.6*   < > 21.7* 23.1*  10.0 15.1* 12.4*  NEUTROABS 17.7*  --   --   --   --  11.5* 8.6*  HGB 10.8*   < > 9.2* 8.8* 8.6* 8.0* 7.9*  HCT 33.3*   < > 27.7* 26.3* 26.4* 25.2* 25.0*  MCV 81.4   < > 80.5 80.4 80.7 80.0 80.1  PLT 387   < > 369 363 384 425* 441*   < > = values in this interval not displayed.   Cardiac Enzymes: No results for input(s): CKTOTAL, CKMB, CKMBINDEX, TROPONINI in the last 168 hours. BNP: Invalid input(s): POCBNP CBG: Recent Labs  Lab 03/21/18 0759 03/21/18 1206 03/21/18 1718 03/21/18 2051 03/22/18 0759  GLUCAP 146* 182* 166* 195* 104*   D-Dimer No results for input(s): DDIMER in the last 72 hours. Hgb A1c No results for input(s): HGBA1C in the last 72 hours. Lipid Profile No results for input(s): CHOL, HDL, LDLCALC, TRIG, CHOLHDL, LDLDIRECT in the last 72 hours. Thyroid function studies No results for input(s): TSH, T4TOTAL, T3FREE, THYROIDAB in the last 72 hours.  Invalid input(s): FREET3 Anemia work up No results for input(s): VITAMINB12, FOLATE, FERRITIN, TIBC, IRON, RETICCTPCT in the last 72 hours. Urinalysis    Component Value Date/Time   COLORURINE YELLOW 03/15/2018 0421   APPEARANCEUR CLEAR 03/15/2018 0421   LABSPEC 1.012 03/15/2018 0421   PHURINE 5.0 03/15/2018 0421   GLUCOSEU 50 (A) 03/15/2018 0421   HGBUR SMALL (A) 03/15/2018 0421   BILIRUBINUR NEGATIVE 03/15/2018 0421   KETONESUR NEGATIVE 03/15/2018 0421   PROTEINUR NEGATIVE 03/15/2018 0421   NITRITE NEGATIVE 03/15/2018 0421   LEUKOCYTESUR NEGATIVE 03/15/2018 0421   Sepsis Labs Invalid input(s): PROCALCITONIN,  WBC,  LACTICIDVEN Microbiology Recent Results (from the past 240 hour(s))  Culture, blood (routine x 2)     Status: None   Collection Time: 03/15/18  6:13 PM  Result Value Ref Range Status   Specimen Description BLOOD BLOOD RIGHT FOREARM  Final    Special Requests   Final    BOTTLES DRAWN AEROBIC AND ANAEROBIC Blood Culture adequate volume   Culture   Final    NO GROWTH 5 DAYS Performed at [facility], [address] [place], [place] [address]    Report Status 03/20/2018 FINAL  Final  Culture, blood (routine x 2)     Status: None   Collection Time: 03/15/18  6:54 PM  Result Value Ref Range Status   Specimen Description BLOOD LEFT ANTECUBITAL  Final   Special Requests   Final    BOTTLES DRAWN AEROBIC AND ANAEROBIC Blood Culture results may not be optimal due to an excessive volume of blood received in culture bottles   Culture   Final    NO GROWTH 5 DAYS Performed at [facility], [address] [place], [place] [address]    Report Status 03/20/2018 FINAL  Final  Surgical pcr screen     Status: None   Collection Time: 03/19/18  6:18 AM  Result Value Ref Range Status   MRSA, PCR NEGATIVE NEGATIVE Final   Staphylococcus aureus NEGATIVE NEGATIVE Final    Comment: (NOTE) The Xpert SA Assay (FDA approved for NASAL specimens in patients 59 years of age and older), is one component of a comprehensive surveillance program. It is not intended to diagnose infection nor to guide or monitor treatment. Performed at [facility], [address] [place], [place] [address]      Time coordinating discharge: 45 minutes  SIGNED:   York RamMauricio Daniel  Tracia Lacomb, MD  [facility] 03/22/2018, 11:41 AM Pager [phone]  If 7PM-7AM, please contact night-coverage [email or web address] Password [email or web address]

## 2018-03-22 NOTE — Progress Notes (Signed)
CSW spoke with Lowella BandyNikki at [facility].  Placement for pt will be on Sunday morning due to not being able to secure prescriptions for pt in time for disposition.  Budd Palmerara Oliviah Agostini LCSWA [phone]

## 2018-03-22 NOTE — Plan of Care (Signed)
  Problem: Pain Managment: Goal: General experience of comfort will improve Outcome: Progressing  Patient able to tolerate oxycodone q6 hours prn

## 2018-03-23 DIAGNOSIS — I739 Peripheral vascular disease, unspecified: Secondary | ICD-10-CM | POA: Diagnosis not present

## 2018-03-23 DIAGNOSIS — G8911 Acute pain due to trauma: Secondary | ICD-10-CM | POA: Diagnosis not present

## 2018-03-23 DIAGNOSIS — Z89511 Acquired absence of right leg below knee: Secondary | ICD-10-CM | POA: Diagnosis not present

## 2018-03-23 DIAGNOSIS — E1129 Type 2 diabetes mellitus with other diabetic kidney complication: Secondary | ICD-10-CM | POA: Diagnosis not present

## 2018-03-23 DIAGNOSIS — M6281 Muscle weakness (generalized): Secondary | ICD-10-CM | POA: Diagnosis not present

## 2018-03-23 DIAGNOSIS — T148XXA Other injury of unspecified body region, initial encounter: Secondary | ICD-10-CM | POA: Diagnosis not present

## 2018-03-23 DIAGNOSIS — I96 Gangrene, not elsewhere classified: Secondary | ICD-10-CM | POA: Diagnosis not present

## 2018-03-23 DIAGNOSIS — Z4781 Encounter for orthopedic aftercare following surgical amputation: Secondary | ICD-10-CM | POA: Diagnosis not present

## 2018-03-23 DIAGNOSIS — L089 Local infection of the skin and subcutaneous tissue, unspecified: Secondary | ICD-10-CM | POA: Diagnosis not present

## 2018-03-23 DIAGNOSIS — E118 Type 2 diabetes mellitus with unspecified complications: Secondary | ICD-10-CM | POA: Diagnosis not present

## 2018-03-23 DIAGNOSIS — I2510 Atherosclerotic heart disease of native coronary artery without angina pectoris: Secondary | ICD-10-CM | POA: Diagnosis not present

## 2018-03-23 DIAGNOSIS — I129 Hypertensive chronic kidney disease with stage 1 through stage 4 chronic kidney disease, or unspecified chronic kidney disease: Secondary | ICD-10-CM | POA: Diagnosis not present

## 2018-03-23 DIAGNOSIS — I10 Essential (primary) hypertension: Secondary | ICD-10-CM | POA: Diagnosis not present

## 2018-03-23 DIAGNOSIS — L03115 Cellulitis of right lower limb: Secondary | ICD-10-CM | POA: Diagnosis not present

## 2018-03-23 DIAGNOSIS — S8990XA Unspecified injury of unspecified lower leg, initial encounter: Secondary | ICD-10-CM | POA: Diagnosis not present

## 2018-03-23 DIAGNOSIS — R2681 Unsteadiness on feet: Secondary | ICD-10-CM | POA: Diagnosis not present

## 2018-03-23 DIAGNOSIS — Z794 Long term (current) use of insulin: Secondary | ICD-10-CM | POA: Diagnosis not present

## 2018-03-23 DIAGNOSIS — E785 Hyperlipidemia, unspecified: Secondary | ICD-10-CM | POA: Diagnosis not present

## 2018-03-23 DIAGNOSIS — G40909 Epilepsy, unspecified, not intractable, without status epilepticus: Secondary | ICD-10-CM | POA: Diagnosis not present

## 2018-03-23 DIAGNOSIS — A419 Sepsis, unspecified organism: Secondary | ICD-10-CM | POA: Diagnosis not present

## 2018-03-23 DIAGNOSIS — R262 Difficulty in walking, not elsewhere classified: Secondary | ICD-10-CM | POA: Diagnosis not present

## 2018-03-23 DIAGNOSIS — N182 Chronic kidney disease, stage 2 (mild): Secondary | ICD-10-CM | POA: Diagnosis not present

## 2018-03-23 DIAGNOSIS — D509 Iron deficiency anemia, unspecified: Secondary | ICD-10-CM | POA: Diagnosis not present

## 2018-03-23 DIAGNOSIS — N179 Acute kidney failure, unspecified: Secondary | ICD-10-CM | POA: Diagnosis not present

## 2018-03-23 DIAGNOSIS — R488 Other symbolic dysfunctions: Secondary | ICD-10-CM | POA: Diagnosis not present

## 2018-03-23 LAB — GLUCOSE, CAPILLARY: GLUCOSE-CAPILLARY: 191 mg/dL — AB (ref 65–99)

## 2018-03-23 NOTE — Progress Notes (Signed)
Patient medically stable to be transferred to SNF. No chest pain or dyspnea, vitals stable, unchanged physical examination.

## 2018-03-23 NOTE — Progress Notes (Signed)
Pt given discharge instructions, prescriptions, and care notes. Report given to [facility]. IV was discontinued, no redness, pain, or swelling noted at this time. Telemetry discontinued and Centralized Telemetry was notified. Pt left the floor via stretcher with PTAR in stable condition.

## 2018-03-23 NOTE — Progress Notes (Signed)
Patient will Discharge To: [facility] Anticipated DC Date:03/23/18 Family Notified:yes, Carlyle BasquesCynthia Busic, [phone] Transport By: Sharin MonsPTAR   Per MD patient ready for DC to [facility] . RN, patient, patient's family, and facility notified of DC. Assessment, Fl2/Pasrr, and Discharge Summary sent to facility. RN given number for report [phone]). DC packet on chart. Ambulance transport requested for patient.   CSW signing off.  Budd Palmerara Gulianna Hornsby LCSWA [phone]

## 2018-03-24 ENCOUNTER — Non-Acute Institutional Stay (SKILLED_NURSING_FACILITY): Payer: PPO | Admitting: Internal Medicine

## 2018-03-24 ENCOUNTER — Encounter: Payer: Self-pay | Admitting: Internal Medicine

## 2018-03-24 ENCOUNTER — Ambulatory Visit: Payer: PPO | Admitting: Family

## 2018-03-24 DIAGNOSIS — E785 Hyperlipidemia, unspecified: Secondary | ICD-10-CM | POA: Diagnosis not present

## 2018-03-24 DIAGNOSIS — I739 Peripheral vascular disease, unspecified: Secondary | ICD-10-CM | POA: Diagnosis not present

## 2018-03-24 DIAGNOSIS — I10 Essential (primary) hypertension: Secondary | ICD-10-CM

## 2018-03-24 DIAGNOSIS — Z794 Long term (current) use of insulin: Secondary | ICD-10-CM

## 2018-03-24 DIAGNOSIS — N182 Chronic kidney disease, stage 2 (mild): Secondary | ICD-10-CM

## 2018-03-24 DIAGNOSIS — N179 Acute kidney failure, unspecified: Secondary | ICD-10-CM

## 2018-03-24 DIAGNOSIS — A419 Sepsis, unspecified organism: Secondary | ICD-10-CM | POA: Diagnosis not present

## 2018-03-24 DIAGNOSIS — L089 Local infection of the skin and subcutaneous tissue, unspecified: Secondary | ICD-10-CM | POA: Diagnosis not present

## 2018-03-24 DIAGNOSIS — G40909 Epilepsy, unspecified, not intractable, without status epilepticus: Secondary | ICD-10-CM

## 2018-03-24 DIAGNOSIS — L03115 Cellulitis of right lower limb: Secondary | ICD-10-CM

## 2018-03-24 DIAGNOSIS — E118 Type 2 diabetes mellitus with unspecified complications: Secondary | ICD-10-CM

## 2018-03-24 DIAGNOSIS — I96 Gangrene, not elsewhere classified: Secondary | ICD-10-CM

## 2018-03-24 DIAGNOSIS — T148XXA Other injury of unspecified body region, initial encounter: Secondary | ICD-10-CM

## 2018-03-24 NOTE — Progress Notes (Signed)
: Provider:  Randon GoldsmithAnne D. Lyn HollingsheadAlexander, MD Location:  [facility] Room Number: [phone] Place of Service:  SNF [phone])  PCP: Jackie Plumsei-Bonsu, George, MD Patient Care Team: Jackie Plumsei-Bonsu, George, MD as PCP - General (Internal Medicine) Saporito, Fanny DanceJoanna D, LCSW as [facility] Care Management  Extended Emergency Contact Information Primary Emergency Contact: Carlyle BasquesLOMAX, CYNTHIA Mobile Phone: [phone] Relation: Spouse     Allergies: Patient has no known allergies.  Chief Complaint  Patient presents with  . New Admit To SNF    Admit to Facility    HPI: Patient is 57 y.o. male with hypertension, hyperlipidemia, diabetes mellitus, coronary artery disease, iron deficiency anemia, chronic kidney disease 2, seizure, and peripheral vascular disease who [facility] department with right foot pain.  Patient with history of peripheral vascular disease and right third toe gangrene.  He underwent angioplasty of right superficial femoral artery on 02/14/18 and right third toe amputation.  Patient did not take his antibiotic because he could not afford it and found that he had a worsening right foot pain in the last several days.  The wound to the base of the third toe has not healed up.  Pain is sharp 10 out of 10 nonradiating and his foot is swollen.  He denied fever, chills, chest pain, shortness of breath, cough, nausea, vomiting, diarrhea, abdominal pain, symptoms of UTI and unilateral weakness.  In the ED patient was found to have a white count of 21.6, worsening renal function normal temperature tachycardia, tachypnea and oxygen saturation 97% on room air.  Patient [facility]-30 Where he was diagnosed and treated with sepsis due to right diabetic foot woundinfection/gangrene.  He underwent a below the knee amputation with no major complications.  Patient had been placed on broad-spectrum IV antibiotic therapy which was  continued for 48 hours after his surgery.  Hospital course was complicated by acute kidney injury on chronic kidney disease and hypokalemia both which were corrected.  Patient is admitted to skilled nursing facility for OT/PT while at skilled nursing facility patient will be followed for hypertension treated with Norvasc, peripheral vascular disease treated with aspirin, Plavix, statin, and seizure disorder treated with Keppra.  Past Medical History:  Diagnosis Date  . Diabetes mellitus without complication (HCC)   . Hypertension   . MI, old   . Seizures (HCC)   . Stroke [facility](HCC)     Past Surgical History:  Procedure Laterality Date  . ABDOMINAL AORTOGRAM W/LOWER EXTREMITY N/A 02/14/2018   Procedure: ABDOMINAL AORTOGRAM W/LOWER EXTREMITY;  Surgeon: Sherren KernsFields, Charles E, MD;  [facility]: [facility] INVASIVE CV LAB;  Service: Cardiovascular;  Laterality: N/A;  . AMPUTATION Right 02/17/2018   Procedure: AMPUTATION, RIGHT 3RD TOE;  Surgeon: Eldred MangesYates, Mark C, MD;  Location: [facility] OR;  Service: Orthopedics;  Laterality: Right;  . AMPUTATION Right 03/19/2018   Procedure: AMPUTATION BELOW KNEE;  Surgeon: Eldred MangesYates, Mark C, MD;  Location: [facility] OR;  Service: Orthopedics;  Laterality: Right;  . PERIPHERAL VASCULAR BALLOON ANGIOPLASTY Right 02/14/2018   Procedure: PERIPHERAL VASCULAR BALLOON ANGIOPLASTY;  Surgeon: Sherren KernsFields, Charles E, MD;  Location: [facility] INVASIVE CV LAB;  Service: Cardiovascular;  Laterality: Right;  sfa    Allergies as of 03/24/2018   No Known Allergies     Medication List        Accurate as of 03/24/18 10:44 AM. Always use your most recent med list.          amLODipine  5 MG tablet Commonly known as:  NORVASC Take 5 mg by mouth daily.   aspirin 81 MG EC tablet Take 1 tablet (81 mg total) by mouth daily.   atorvastatin 20 MG tablet Commonly known as:  LIPITOR Take 1 tablet (20 mg total) by mouth daily at 6 PM.   clopidogrel 75 MG tablet Commonly known as:  PLAVIX Take 1 tablet (75 mg total) by mouth  daily with breakfast.   docusate sodium 100 MG capsule Commonly known as:  COLACE Take 1 capsule (100 mg total) by mouth 2 (two) times daily.   feeding supplement (ENSURE ENLIVE) Liqd Take 237 mLs by mouth 2 (two) times daily between meals.   ferrous sulfate 325 (65 FE) MG tablet Take 1 tablet (325 mg total) by mouth 2 (two) times daily with a meal.   folic acid 1 MG tablet Commonly known as:  FOLVITE Take 1 mg by mouth daily.   gabapentin 600 MG tablet Commonly known as:  NEURONTIN Take 600 mg by mouth 2 (two) times daily.   insulin aspart 100 UNIT/ML injection Commonly known as:  novoLOG Inject 0-9 Units into the skin 3 (three) times daily with meals. For glucose 121-150 use 1 unit, for 151-200 use 2 units, 201-250 use 3 units, for 251-300 use 5 units, for 301-350 use 7 units, for 351 or greater use 9 units.   insulin detemir 100 UNIT/ML injection Commonly known as:  LEVEMIR Inject 0.05 mLs (5 Units total) into the skin at bedtime.   levETIRAcetam 500 MG tablet Commonly known as:  KEPPRA Take 500 mg by mouth 2 (two) times daily.   metFORMIN 500 MG tablet Commonly known as:  GLUCOPHAGE Take 1,000 mg by mouth daily with breakfast.   multivitamin with minerals Tabs tablet Take 1 tablet by mouth daily.   Oxycodone HCl 10 MG Tabs Take 1 tablet (10 mg total) by mouth every 6 (six) hours as needed for moderate pain.   polyethylene glycol packet Commonly known as:  MIRALAX / GLYCOLAX Take 17 g by mouth daily.   protein supplement shake Liqd Commonly known as:  PREMIER PROTEIN Take 325 mLs (11 oz total) by mouth 2 (two) times daily between meals.   tiZANidine 4 MG tablet Commonly known as:  ZANAFLEX TAKE 2 TABLETS BY MOUTH 3 TIMES A DAY AS NEEDED       No orders of the defined types were placed in this encounter.    There is no immunization history on file for this patient.  Social History   Tobacco Use  . Smoking status: Never Smoker  . Smokeless tobacco:  Former Engineer, water Use Topics  . Alcohol use: No    Frequency: Never    Family history is   Family History  Problem Relation Age of Onset  . Cancer Father   . Diabetes Neg Hx       Review of Systems  DATA OBTAINED: from patient GENERAL:  no fevers, fatigue, appetite changes SKIN: No itching, or rash EYES: No eye pain, redness, discharge EARS: No earache, tinnitus, change in hearing NOSE: No congestion, drainage or bleeding  MOUTH/THROAT: No mouth or tooth pain, No sore throat RESPIRATORY: No cough, wheezing, SOB CARDIAC: No chest pain, palpitations, lower extremity edema  GI: No abdominal pain, No N/V/D or constipation, No heartburn or reflux  GU: No dysuria, frequency or urgency, or incontinence  MUSCULOSKELETAL: No unrelieved bone/joint pain NEUROLOGIC: No headache, dizziness or focal weakness PSYCHIATRIC: No c/o anxiety or sadness  Vitals:   03/24/18 1035  BP: 124/76  Pulse: 76  Resp: 20  Temp: 98 F (36.7 C)  SpO2: 98%    SpO2 Readings from Last 1 Encounters:  03/24/18 98%   Body mass index is 21.03 kg/m.     Physical Exam  GENERAL APPEARANCE: Alert, conversant,  No acute distress.  SKIN: No diaphoresis rash HEAD: Normocephalic, atraumatic  EYES: Conjunctiva/lids clear. Pupils round, reactive. EOMs intact.  EARS: External exam WNL, canals clear. Hearing grossly normal.  NOSE: No deformity or discharge.  MOUTH/THROAT: Lips w/o lesions  RESPIRATORY: Breathing is even, unlabored. Lung sounds are clear   CARDIOVASCULAR: Heart RRR no murmurs, rubs or gallops. No peripheral edema.   GASTROINTESTINAL: Abdomen is soft, non-tender, not distended w/ normal bowel sounds. GENITOURINARY: Bladder non tender, not distended  MUSCULOSKELETAL:  status post right BKA NEUROLOGIC:  Cranial nerves 2-12 grossly intact. Moves all extremities  PSYCHIATRIC: Mood and affect appropriate to situation, no behavioral issues  Patient Active Problem List   Diagnosis Date  Noted  . Cellulitis of right lower extremity   . Wound infection 03/15/2018  . Diabetic foot infection (HCC) 03/15/2018  . HLD (hyperlipidemia) 03/15/2018  . PVD (peripheral vascular disease) (HCC) 03/15/2018  . CAD (coronary artery disease) 03/15/2018  . Iron deficiency anemia 03/15/2018  . Acute renal failure superimposed on stage 2 chronic kidney disease (HCC) 03/15/2018  . Sepsis (HCC) 03/15/2018  . Poorly controlled type 2 diabetes mellitus (HCC)   . Gangrene of toe of right foot (HCC) 02/12/2018  . Type II diabetes mellitus with renal manifestations (HCC) 02/12/2018  . HTN (hypertension) 02/12/2018  . Seizure disorder (HCC) 02/12/2018  . Ischemic pain of foot, right 02/11/2018  . Gangrene of foot (HCC) 02/11/2018      Labs reviewed: Basic Metabolic Panel:    Component Value Date/Time   NA 137 03/22/2018 0602   K 3.4 (L) 03/22/2018 0602   CL 104 03/22/2018 0602   CO2 25 03/22/2018 0602   GLUCOSE 102 (H) 03/22/2018 0602   BUN 14 03/22/2018 0602   CREATININE 0.84 03/22/2018 0602   CALCIUM 8.1 (L) 03/22/2018 0602   PROT 8.9 (H) 03/15/2018 1813   ALBUMIN 3.0 (L) 03/15/2018 1813   AST 21 03/15/2018 1813   ALT 11 (L) 03/15/2018 1813   ALKPHOS 79 03/15/2018 1813   BILITOT 0.6 03/15/2018 1813   GFRNONAA >60 03/22/2018 0602   GFRAA >60 03/22/2018 0602    Recent Labs    02/20/18 0410  03/18/18 0516 03/19/18 0623 03/20/18 0407 03/21/18 0530 03/22/18 0602  NA 138   < > 135 136 137 137 137  K 3.5   < > 3.9 3.4* 3.8 3.0* 3.4*  CL 106   < > 103 103 105 105 104  CO2 23   < > 23 21* 22 25 25   GLUCOSE 97   < > 125* 103* 199* 131* 102*  BUN 17   < > 9 11 19 14 14   CREATININE 1.14   < > 1.06 0.98 1.06 0.89 0.84  CALCIUM 8.6*   < > 8.2* 8.0* 7.8* 7.7* 8.1*  MG 1.9  --  1.6* 1.8  --   --   --    < > = values in this interval not displayed.   Liver Function Tests: Recent Labs    02/11/18 2318 02/21/18 1057 03/15/18 1813  AST 21 21 21   ALT 17 12* 11*  ALKPHOS 66 49  79  BILITOT 0.6 0.8 0.6  PROT 8.9* 6.7 8.9*  ALBUMIN 4.3 3.0* 3.0*   No results for input(s): LIPASE, AMYLASE in the last 8760 hours. No results for input(s): AMMONIA in the last 8760 hours. CBC: Recent Labs    03/15/18 1813  03/20/18 0407 03/21/18 0530 03/22/18 0602  WBC 21.6*   < > 10.0 15.1* 12.4*  NEUTROABS 17.7*  --   --  11.5* 8.6*  HGB 10.8*   < > 8.6* 8.0* 7.9*  HCT 33.3*   < > 26.4* 25.2* 25.0*  MCV 81.4   < > 80.7 80.0 80.1  PLT 387   < > 384 425* 441*   < > = values in this interval not displayed.   Lipid Recent Labs    02/16/18 0617  CHOL 91  HDL 34*  LDLCALC 37  TRIG 99    Cardiac Enzymes: No results for input(s): CKTOTAL, CKMB, CKMBINDEX, TROPONINI in the last 8760 hours. BNP: No results for input(s): BNP in the last 8760 hours. No results found for: [facility] Lab Results  Component Value Date   HGBA1C 11.4 (H) 02/12/2018   No results found for: TSH Lab Results  Component Value Date   VITAMINB12 357 02/21/2018   No results found for: FOLATE Lab Results  Component Value Date   IRON 64 02/15/2018   TIBC 200 (L) 02/15/2018   FERRITIN 549 (H) 02/15/2018    Imaging and Procedures obtained prior to SNF admission: Mr Foot Right Wo Contrast  Result Date: 03/15/2018 CLINICAL DATA:  Ulceration along the lateral aspect of the forefoot adjacent to the fifth metatarsal. Recent amputation of the third toe 1 month ago. Diabetes. EXAM: MRI OF THE RIGHT FOREFOOT WITHOUT CONTRAST TECHNIQUE: Multiplanar, multisequence MR imaging of the right forefoot was performed. No intravenous contrast was administered. COMPARISON:  Radiographs from the same day FINDINGS: Bones/Joint/Cartilage Maintained cortices without bone destruction or fracture. Minor reactive marrow edema of the lateral aspect of the fifth metatarsal head and neck and base of fifth metatarsal. No joint dislocation or effusions. Status post amputation of the third toe at the MTP articulation. Mild joint  space narrowing, spurring and subchondral cystic change across the talonavicular joint with minor reactive edema along the plantar aspect of the navicular. Mild cuboid reactive edema without cortical bone loss. Ligaments Negative Muscles and Tendons Myositis of the plantar muscles of the forefoot. The flexor and extensor tendons crossing the forefoot demonstrate no acute tear or significant tenosynovitis. Soft tissues Diffuse soft tissue swelling of the forefoot without focal fluid collections. Subcutaneous emphysema is noted of the forefoot. Soft tissue ulceration along the lateral aspect of the forefoot likely accounts for the emphysema. IMPRESSION: 1. Cellulitis and myositis of the forefoot with soft tissue ulceration adjacent to the lateral aspect of the fifth metatarsal head. Associated subcutaneous emphysema is seen of the forefoot. 2. Reactive marrow edema is noted without cortical bone loss involving the distal fifth metatarsal, proximal fifth phalanx and cuboid. 3. Status post amputation of the third toe. Electronically Signed   By: Tollie Eth M.D.   On: 03/15/2018 23:22   Dg Foot Complete Right  Result Date: 03/15/2018 CLINICAL DATA:  Right foot pain after toe amputation a couple weeks ago. EXAM: RIGHT FOOT COMPLETE - 3+ VIEW COMPARISON:  02/11/2018 FINDINGS: Interval amputation of the third toe distal to the head of the metatarsal. Minimal associated air in the soft tissues. Small vessel atherosclerotic disease is present. There is mild degenerative change over the midfoot. There is a small inferior calcaneal spur.  IMPRESSION: Postsurgical change compatible with amputation of the third toe distal to the head of the metatarsal. Air in the adjacent soft tissues which may be postsurgical although cannot exclude infection. Electronically Signed   By: Elberta Fortis M.D.   On: 03/15/2018 18:58     Not all labs, radiology exams or other studies done during hospitalization come through on my EPIC note;  however they are reviewed by me.    Assessment and Plan  Sepsis/right diabetic foot gangrene-patient had had vascular surgery and right third toe amputation but had not taken postop antibiotics secondary to cost so presented with gangrene and a white count of 21,000; patient underwent a right BKA with no complications other than acute kidney injury  SNF -admitted for OT/PT  Acute on chronic kidney disease/hyperkalemia-improved with IV fluids and replacement; discharge creatinine 0.84 SNF -follow-up BMP  Peripheral vascular disease SNF continue-ASA 81 mg daily, Plavix 75 mg daily, and statin  Hypertension SNF- controlled on Norvasc 5 mg daily; Continue current regimen  Hyperlipidemia SNF LDL 137; treat with Lipitor 80 mg daily  Diabetes mellitus type 2 SNF -controlled while in hospital; continue Levemir 5 units at bedtime sliding scale insulin and Glucophage 1000 mg every morning, patient is on statin, not on ACE  Seizure disorder SNF controlled; continue Keppra 500 mg twice daily time spent greater than 45 minutes   Time spent greater than 45 minutes;> 50% of time with patient was spent reviewing records, labs, tests and studies, counseling and developing plan of care  Thurston Hole D. Lyn Hollingshead, MD

## 2018-03-25 ENCOUNTER — Telehealth (INDEPENDENT_AMBULATORY_CARE_PROVIDER_SITE_OTHER): Payer: Self-pay

## 2018-03-25 ENCOUNTER — Other Ambulatory Visit: Payer: Self-pay | Admitting: Licensed Clinical Social Worker

## 2018-03-25 NOTE — Telephone Encounter (Signed)
Luke Manning with [facility] wanted to know if patient needed to keep his dressing on unitl seeing Dr. Ophelia Manning.  Talked with Luke Manning, Dr. Ophelia Manning assistant and she stated that patient needed to keep dressing on until appt.on 04/02/18.  Cb# is [phone].  Please advise.  Thank You.

## 2018-03-25 NOTE — Patient Outreach (Signed)
[facility]) Care Management  03/25/2018  Luke Manning 12/23/1961 [id number]  Assessment- [facility] CSW received [facility] on 03/21/18 and completed initial visit with patient on 03/25/18. Patient has a medical history of hypertension, hyperlipidemia, diabetes mellitus, CAD, iron deficiency anemia, CKD-2, seizures, PVD, past toe amputations and recent right below-knee amuptation. CSW introduced self, reason for visit and of [facility]. Patient previously active with [facility] and [facility] Danford BadJoanna Saporito was assisting patient with SCAT and has already completed entire application and faxed it to SCAT office. Patient reminded that it will be patient's responsibility to complete an in person interview with SCAT office in order to finish the enrollment process. Patient expressed understanding. Patient reports to be doing "very well." Patient reports his main needs are transportation and pharmacy related. Patient shares that he had difficulty affording some of his medications in the past. Patient and spouse live together but only have one car and patient states  that this car is "on it's last leg" and an unstable source of transportation for him to his medical appointments. Patient reports that he was told he would be at SNF until they can get him a prosthetic leg.CSW provided contact information for patient to keep.   CSW notifed [facility] that [facility] will be following patient.  Dickie LaBrooke Ozzie Knobel, BSW, MSW, LCSW [facility] Care Management [email or web address] Phone: [phone] Fax: [phone]

## 2018-03-26 ENCOUNTER — Encounter: Payer: Self-pay | Admitting: Internal Medicine

## 2018-03-26 DIAGNOSIS — E118 Type 2 diabetes mellitus with unspecified complications: Secondary | ICD-10-CM | POA: Insufficient documentation

## 2018-03-27 ENCOUNTER — Ambulatory Visit: Payer: Self-pay | Admitting: *Deleted

## 2018-03-28 LAB — BASIC METABOLIC PANEL
BUN: 23 — AB (ref 4–21)
Creatinine: 0.8 (ref 0.6–1.3)
GLUCOSE: 195
Potassium: 4.8 (ref 3.4–5.3)
SODIUM: 136 — AB (ref 137–147)

## 2018-04-01 ENCOUNTER — Other Ambulatory Visit: Payer: PPO | Admitting: Licensed Clinical Social Worker

## 2018-04-01 NOTE — Patient Outreach (Signed)
[facility]) Care Management  04/01/2018  Luke Manning 1961/11/23 [id number]  Assessment- CSW arrived at [facility] and met with patient in the dining room. Patient reports to be doing well but that he has started to experience pain in the mornings. Patient shares that he is currently on pain medication to treat these symptoms. Patient shares that his appetite and sleep are both "so/so." Patient denies any SOB or chest pain at this time. Patient reports that there has been no set discharge date scheduled yet for him at this time. He shares that he spouse continues to check on him daily at facility. Patient denied receiving a call from SCAT services in order to schedule an in person assessment appointment. CSW completed call to SCAT and left message with Shirlee Limerick inquiring about his current application status.  Plan-CSW will follow up within two weeks and will continue to provide social work assistance at this time until his SNF discharge back home.  Eula Fried, BSW, MSW, [email or web address] Phone: [phone] Fax: [phone]

## 2018-04-02 ENCOUNTER — Encounter (INDEPENDENT_AMBULATORY_CARE_PROVIDER_SITE_OTHER): Payer: Self-pay | Admitting: Orthopaedic Surgery

## 2018-04-02 ENCOUNTER — Ambulatory Visit (INDEPENDENT_AMBULATORY_CARE_PROVIDER_SITE_OTHER): Payer: PPO | Admitting: Orthopaedic Surgery

## 2018-04-02 DIAGNOSIS — Z89511 Acquired absence of right leg below knee: Secondary | ICD-10-CM

## 2018-04-02 NOTE — Progress Notes (Signed)
   Post-Op Visit Note   Patient: Luke Manning           Date of Birth: 01/20/1961           MRN: [id number] Visit Date: 04/02/2018 PCP: Jackie Plumsei-Bonsu, George, MD   Assessment & Plan: Incision looks good stump shrinker sock return in 3 weeks. Time to stop narcotics and use tylenol/advil .   Chief Complaint:  Chief Complaint  Patient presents with  . Right Leg - Routine Post Op    03/19/18 right BKA   Visit Diagnoses:  1. Status post below knee amputation of right lower extremity (HCC)     Plan: Return in 3 weeks for recheck.  Follow-Up Instructions: No follow-ups on file.   Orders:  No orders of the defined types were placed in this encounter.  No orders of the defined types were placed in this encounter.   Imaging: No results found.  PMFS History: Patient Active Problem List   Diagnosis Date Noted  . Controlled diabetes mellitus type 2 with complications (HCC) 03/26/2018  . Cellulitis of right lower extremity   . Wound infection 03/15/2018  . Diabetic foot infection (HCC) 03/15/2018  . HLD (hyperlipidemia) 03/15/2018  . PVD (peripheral vascular disease) (HCC) 03/15/2018  . CAD (coronary artery disease) 03/15/2018  . Iron deficiency anemia 03/15/2018  . Acute renal failure superimposed on stage 2 chronic kidney disease (HCC) 03/15/2018  . Sepsis (HCC) 03/15/2018  . Poorly controlled type 2 diabetes mellitus (HCC)   . Gangrene of toe of right foot (HCC) 02/12/2018  . Type II diabetes mellitus with renal manifestations (HCC) 02/12/2018  . HTN (hypertension) 02/12/2018  . Seizure disorder (HCC) 02/12/2018  . Ischemic pain of foot, right 02/11/2018  . Gangrene of foot (HCC) 02/11/2018   Past Medical History:  Diagnosis Date  . Diabetes mellitus without complication (HCC)   . Hypertension   . MI, old   . Seizures (HCC)   . Stroke [facility](HCC)     Family History  Problem Relation Age of Onset  . Cancer Father   . Diabetes Neg Hx     Past Surgical History:  Procedure  Laterality Date  . ABDOMINAL AORTOGRAM W/LOWER EXTREMITY N/A 02/14/2018   Procedure: ABDOMINAL AORTOGRAM W/LOWER EXTREMITY;  Surgeon: Sherren KernsFields, Charles E, MD;  [facility] IN[facility];  Service: Cardiovascular;  Laterality: N/A;  . AMPUTATION Right 02/17/2018   Procedure: AMPUTATION, RIGHT 3RD TOE;  Surgeon: Eldred MangesYates, Jalyssa Fleisher C, MD;  Location: [facility] OR;  Service: Orthopedics;  Laterality: Right;  . AMPUTATION Right 03/19/2018   Procedure: AMPUTATION BELOW KNEE;  Surgeon: Eldred MangesYates, Dejana Pugsley C, MD;  Location: [facility] OR;  Service: Orthopedics;  Laterality: Right;  . PERIPHERAL VASCULAR BALLOON ANGIOPLASTY Right 02/14/2018   Procedure: PERIPHERAL VASCULAR BALLOON ANGIOPLASTY;  Surgeon: Sherren KernsFields, Charles E, MD;  [facility]: [facility] IN[facility];  Service: Cardiovascular;  Laterality: Right;  sfa   Social History   Occupational History  . Not on file  Tobacco Use  . Smoking status: Never Smoker  . Smokeless tobacco: Former Engineer, waterUser  Substance and Sexual Activity  . Alcohol use: No    Frequency: Never  . Drug use: No  . Sexual activity: Not on file

## 2018-04-07 ENCOUNTER — Other Ambulatory Visit: Payer: Self-pay | Admitting: Licensed Clinical Social Worker

## 2018-04-07 NOTE — Patient Outreach (Signed)
[facility]) Care Management  04/07/2018  Luke Manning 01/03/1961 [id number]  Assessment- [facility] CSW [facility] at [facility] on 04/07/18 to complete SNF visit with patient. Patient was in his room when [facility] arrived. Patient reports that he was told today that his discharge date will be 04/09/18. Patient shares that he continues to stay very positive. Patient understands that he will need to contact SCAT office himself to schedule in person interview once he discharges from SNF. Patient is agreeable to do so and [facility] provided [facility]AT contact information. Patient denies any further social work needs. Patient is unsure if he will need [facility] involvement stating that at times he has been unable to afford medications. Patient confirms that he does wish for [facility] to make [facility] upon his discharge back home. [facility] CSW encouraged patient to let [facility] know if he needs pharmacy involvement in the future.   Plan-[facility] CSW will place [facility] to [facility] upon patient's discharge back home. [facility] CSW will gain discharge updates from SNF social worker as well.   Dickie LaBrooke Von Quintanar, BSW, MSW, LCSW [facility] Care Management [email or web address] Phone: [phone] Fax: [phone]

## 2018-04-08 ENCOUNTER — Encounter: Payer: Self-pay | Admitting: Internal Medicine

## 2018-04-08 ENCOUNTER — Other Ambulatory Visit: Payer: Self-pay | Admitting: Licensed Clinical Social Worker

## 2018-04-08 ENCOUNTER — Non-Acute Institutional Stay (SKILLED_NURSING_FACILITY): Payer: PPO | Admitting: Internal Medicine

## 2018-04-08 DIAGNOSIS — N179 Acute kidney failure, unspecified: Secondary | ICD-10-CM | POA: Diagnosis not present

## 2018-04-08 DIAGNOSIS — N182 Chronic kidney disease, stage 2 (mild): Secondary | ICD-10-CM

## 2018-04-08 DIAGNOSIS — D509 Iron deficiency anemia, unspecified: Secondary | ICD-10-CM | POA: Diagnosis not present

## 2018-04-08 DIAGNOSIS — I10 Essential (primary) hypertension: Secondary | ICD-10-CM | POA: Diagnosis not present

## 2018-04-08 DIAGNOSIS — Z794 Long term (current) use of insulin: Secondary | ICD-10-CM

## 2018-04-08 DIAGNOSIS — Z89511 Acquired absence of right leg below knee: Secondary | ICD-10-CM | POA: Diagnosis not present

## 2018-04-08 DIAGNOSIS — I739 Peripheral vascular disease, unspecified: Secondary | ICD-10-CM

## 2018-04-08 DIAGNOSIS — E118 Type 2 diabetes mellitus with unspecified complications: Secondary | ICD-10-CM | POA: Diagnosis not present

## 2018-04-08 NOTE — Progress Notes (Signed)
Location:   [facility] Room Number: 414/W Place of Service:  SNF [phone])  Provider: Edmon Crape  PCP: Jackie Plum, MD Patient Care Team: Jackie Plum, MD as PCP - General (Internal Medicine) Saporito, Fanny Dance, LCSW as [facility] Care Management Gustavus Bryant, LCSW as [facility] Care Management (Licensed Clinical Social Worker)  Extended Emergency Contact Information Primary Emergency Contact: DAELEN, BELVEDERE Mobile Phone: [phone] Relation: Spouse  Code Status: Full Code Goals of care:  Advanced Directive information Advanced Directives 04/08/2018  Does Patient Have a Medical Advance Directive? Yes  Type of Advance Directive (No Data)  Does patient want to make changes to medical advance directive? No - Patient declined  Would patient like information on creating a medical advance directive? No - Patient declined     No Known Allergies  Chief Complaint  Patient presents with  . Discharge Note    Discharge Visit    HPI:  57 y.o. male seen today for discharge from facility tomorrow.  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  He has a history of hypertension hyperlipidemia type 2 diabetes coronary artery disease iron deficiency anemia chronic kidney disease seizure disorder and peripheral vascular disease.  He originally had right third toe gangrene and underwent angioplasty of the right superficial artery back in February and right third toe amputation.  Apparently he did not take his antibiotic because he could not afford it and he had worsening right foot pain.  He was found to have a white count of 21,600 with worsening renal function tachycardia as well.  He was admitted to the hospital diagnosed and treated for sepsis secondary to the right diabetic foot wound with infection and gangrene and underwent the below the knee amputation with no major  complications.  He was treated with broad-spectrum IV antibiotics he had no major complications.  His kidney issues and hypokalemia were corrected in the hospital and was admitted to skilled nursing for therapy.  Currently he has no complaints he will be going home with his wife will need continued PT and OT as well as nursing support and will need a wheelchair to assist with ambulation apparently his prosthesis is being pursued as well.  Currently has no acute complaints vital signs appear to be stable      Past Medical History:  Diagnosis Date  . Diabetes mellitus without complication (HCC)   . Hypertension   . MI, old   . Seizures (HCC)   . Stroke [facility])     Past Surgical History:  Procedure Laterality Date  . ABDOMINAL AORTOGRAM W/LOWER EXTREMITY N/A 02/14/2018   Procedure: ABDOMINAL AORTOGRAM W/LOWER EXTREMITY;  Surgeon: Sherren Kerns, MD;  [facility] INV[facility] CV LAB;  Service: Cardiovascular;  Laterality: N/A;  . AMPUTATION Right 02/17/2018   Procedure: AMPUTATION, RIGHT 3RD TOE;  Surgeon: Eldred Manges, MD;  Location: [facility] OR;  Service: Orthopedics;  Laterality: Right;  . AMPUTATION Right 03/19/2018   Procedure: AMPUTATION BELOW KNEE;  Surgeon: Eldred Manges, MD;  Location: [facility] OR;  Service: Orthopedics;  Laterality: Right;  . PERIPHERAL VASCULAR BALLOON ANGIOPLASTY Right 02/14/2018   Procedure: PERIPHERAL VASCULAR BALLOON ANGIOPLASTY;  Surgeon: Sherren Kerns, MD;  [facility] CV LAB;  Service: Cardiovascular;  Laterality: Right;  sfa      reports that he has never smoked. He has quit using smokeless tobacco. He reports that he does not drink alcohol or use  drugs. Social History   Socioeconomic History  . Marital status: Married    Spouse name: Not on file  . Number of children: Not on file  . Years of education: Not on file  . Highest education level: Not on file  Occupational History  . Not on file  Social Needs  . Financial resource strain: Not on  file  . Food insecurity:    Worry: Not on file    Inability: Not on file  . Transportation needs:    Medical: Not on file    Non-medical: Not on file  Tobacco Use  . Smoking status: Never Smoker  . Smokeless tobacco: Former Engineer, water and Sexual Activity  . Alcohol use: No    Frequency: Never  . Drug use: No  . Sexual activity: Not on file  Lifestyle  . Physical activity:    Days per week: Not on file    Minutes per session: Not on file  . Stress: Not on file  Relationships  . Social connections:    Talks on phone: Not on file    Gets together: Not on file    Attends religious service: Not on file    Active member of club or organization: Not on file    Attends meetings of clubs or organizations: Not on file    Relationship status: Not on file  . Intimate partner violence:    Fear of current or ex partner: Not on file    Emotionally abused: Not on file    Physically abused: Not on file    Forced sexual activity: Not on file  Other Topics Concern  . Not on file  Social History Narrative  . Not on file   Functional Status Survey:    No Known Allergies  Pertinent  Health Maintenance Due  Topic Date Due  . FOOT EXAM  05/08/2018 (Originally 05/25/1971)  . OPHTHALMOLOGY EXAM  05/08/2018 (Originally 05/25/1971)  . URINE MICROALBUMIN  05/08/2018 (Originally 05/25/1971)  . COLONOSCOPY  05/08/2018 (Originally 05/25/2009)  . INFLUENZA VACCINE  07/24/2018  . HEMOGLOBIN A1C  08/12/2018    Medications: Allergies as of 04/08/2018   No Known Allergies     Medication List        Accurate as of 04/08/18  2:17 PM. Always use your most recent med list.          acetaminophen 325 MG tablet Commonly known as:  TYLENOL Take 650 mg by mouth every 6 (six) hours as needed.   amLODipine 5 MG tablet Commonly known as:  NORVASC Take 5 mg by mouth daily.   aspirin 81 MG EC tablet Take 1 tablet (81 mg total) by mouth daily.   atorvastatin 20 MG tablet Commonly known as:   LIPITOR Take 1 tablet (20 mg total) by mouth daily at 6 PM.   clopidogrel 75 MG tablet Commonly known as:  PLAVIX Take 1 tablet (75 mg total) by mouth daily with breakfast.   docusate sodium 100 MG capsule Commonly known as:  COLACE Take 1 capsule (100 mg total) by mouth 2 (two) times daily.   feeding supplement (PRO-STAT SUGAR FREE 64) Liqd Take 30 mLs by mouth 2 (two) times daily.   ferrous sulfate 325 (65 FE) MG tablet Take 1 tablet (325 mg total) by mouth 2 (two) times daily with a meal.   folic acid 1 MG tablet Commonly known as:  FOLVITE Take 1 mg by mouth daily.   gabapentin 600 MG tablet Commonly known  as:  NEURONTIN Take 600 mg by mouth 2 (two) times daily.   GLUCERNA PO Give 1 can by mouth TID due to weight loss   ibuprofen 200 MG tablet Commonly known as:  ADVIL,MOTRIN Take 400 mg by mouth every 6 (six) hours as needed.   insulin aspart 100 UNIT/ML injection Commonly known as:  novoLOG Inject 0-9 Units into the skin 3 (three) times daily with meals. For glucose 121-150 use 1 unit, for 151-200 use 2 units, 201-250 use 3 units, for 251-300 use 5 units, for 301-350 use 7 units, for 351 or greater use 9 units.   insulin detemir 100 UNIT/ML injection Commonly known as:  LEVEMIR Inject 0.05 mLs (5 Units total) into the skin at bedtime.   levETIRAcetam 500 MG tablet Commonly known as:  KEPPRA Take 500 mg by mouth 2 (two) times daily.   metFORMIN 500 MG tablet Commonly known as:  GLUCOPHAGE Take 1,000 mg by mouth daily with breakfast.   multivitamin with minerals Tabs tablet Take 1 tablet by mouth daily.   polyethylene glycol packet Commonly known as:  MIRALAX / GLYCOLAX Take 17 g by mouth daily.   tiZANidine 4 MG tablet Commonly known as:  ZANAFLEX TAKE 2 TABLETS BY MOUTH 3 TIMES A DAY AS NEEDED       Review of Systems   General is not complaining of any fever or chills feels he is doing relatively well.  Skin is not complaining of rashes or  itching surgical site has been evaluated by surgery thought to be doing well without evidence of infection apparently they will remove staples on next visit.  Head ears eyes nose mouth and throat is not complaining of any visual changes or sore throat.  Respiratory is not complaining of shortness of breath or cough.  Cardiac denies chest pain or significant lower extremity edema.  GI is not complaining of any abdominal discomfort nausea vomiting diarrhea constipation.  GU does not complain of dysuria.  Musculoskeletal at this point says his pain is controlled appears  to be receiving ibuprofen for this  Neurologic is not complaining of dizziness headache or numbness at this point.  Psyche does not complain of being anxious or depressed appears to be in good spirits  Vitals:   04/08/18 1359  BP: 130/74  Pulse: 84  Resp: 20  Temp: (!) 97.1 F (36.2 C)  TempSrc: Oral    Physical Exam   In general this is a pleasant middle-age male in no distress sitting comfortably on his bed.  His skin is warm and dry surgical stump right leg appears to be healing unremarkably stitches are in place he does have well-healed crusting there is minimal tenderness to palpation I do not see any concerning erythema or drainage.  Eyes sclera conjunctive are clear visual acuity appears to be intact.  Oropharynx is clear mucous membranes moist.  Chest is clear to auscultation there is no labored breathing.  Heart is regular rate and rhythm without murmur gallop or rub he does not have significant lower extremity edema.  Abdomen is soft nontender with positive bowel sounds.  Musculoskeletal moves all extremities x4 he has right below the knee amputation and surgical site as noted above strength appears to be intact his other extremities he is ambulatory in wheelchair waiting on prosthesis.  Neurologic is grossly intact her speech is clear no lateralizing findings.  Psych he is alert and oriented  pleasant and appropriate    Labs reviewed: Basic Metabolic Panel: Recent Labs  02/20/18 0410  03/18/18 4098 03/19/18 1191 03/20/18 0407 03/21/18 0530 03/22/18 0602 03/28/18  NA 138   < > 135 136 137 137 137 136*  K 3.5   < > 3.9 3.4* 3.8 3.0* 3.4* 4.8  CL 106   < > 103 103 105 105 104  --   CO2 23   < > 23 21* 22 25 25   --   GLUCOSE 97   < > 125* 103* 199* 131* 102*  --   BUN 17   < > 9 11 19 14 14  23*  CREATININE 1.14   < > 1.06 0.98 1.06 0.89 0.84 0.8  CALCIUM 8.6*   < > 8.2* 8.0* 7.8* 7.7* 8.1*  --   MG 1.9  --  1.6* 1.8  --   --   --   --    < > = values in this interval not displayed.   Liver Function Tests: Recent Labs    02/11/18 2318 02/21/18 1057 03/15/18 1813  AST 21 21 21   ALT 17 12* 11*  ALKPHOS 66 49 79  BILITOT 0.6 0.8 0.6  PROT 8.9* 6.7 8.9*  ALBUMIN 4.3 3.0* 3.0*   No results for input(s): LIPASE, AMYLASE in the last 8760 hours. No results for input(s): AMMONIA in the last 8760 hours. CBC: Recent Labs    03/15/18 1813  03/20/18 0407 03/21/18 0530 03/22/18 0602  WBC 21.6*   < > 10.0 15.1* 12.4*  NEUTROABS 17.7*  --   --  11.5* 8.6*  HGB 10.8*   < > 8.6* 8.0* 7.9*  HCT 33.3*   < > 26.4* 25.2* 25.0*  MCV 81.4   < > 80.7 80.0 80.1  PLT 387   < > 384 425* 441*   < > = values in this interval not displayed.   Cardiac Enzymes: No results for input(s): CKTOTAL, CKMB, CKMBINDEX, TROPONINI in the last 8760 hours. BNP: Invalid input(s): POCBNP CBG: Recent Labs    03/22/18 1709 03/22/18 2155 03/23/18 0808  GLUCAP 158* 111* 191*    Procedures and Imaging Studies During Stay: Mr Foot Right Wo Contrast  Result Date: 03/15/2018 CLINICAL DATA:  Ulceration along the lateral aspect of the forefoot adjacent to the fifth metatarsal. Recent amputation of the third toe 1 month ago. Diabetes. EXAM: MRI OF THE RIGHT FOREFOOT WITHOUT CONTRAST TECHNIQUE: Multiplanar, multisequence MR imaging of the right forefoot was performed. No intravenous contrast was  administered. COMPARISON:  Radiographs from the same day FINDINGS: Bones/Joint/Cartilage Maintained cortices without bone destruction or fracture. Minor reactive marrow edema of the lateral aspect of the fifth metatarsal head and neck and base of fifth metatarsal. No joint dislocation or effusions. Status post amputation of the third toe at the MTP articulation. Mild joint space narrowing, spurring and subchondral cystic change across the talonavicular joint with minor reactive edema along the plantar aspect of the navicular. Mild cuboid reactive edema without cortical bone loss. Ligaments Negative Muscles and Tendons Myositis of the plantar muscles of the forefoot. The flexor and extensor tendons crossing the forefoot demonstrate no acute tear or significant tenosynovitis. Soft tissues Diffuse soft tissue swelling of the forefoot without focal fluid collections. Subcutaneous emphysema is noted of the forefoot. Soft tissue ulceration along the lateral aspect of the forefoot likely accounts for the emphysema. IMPRESSION: 1. Cellulitis and myositis of the forefoot with soft tissue ulceration adjacent to the lateral aspect of the fifth metatarsal head. Associated subcutaneous emphysema is seen of the forefoot. 2. Reactive marrow  edema is noted without cortical bone loss involving the distal fifth metatarsal, proximal fifth phalanx and cuboid. 3. Status post amputation of the third toe. Electronically Signed   By: Tollie Eth M.D.   On: 03/15/2018 23:22   Dg Foot Complete Right  Result Date: 03/15/2018 CLINICAL DATA:  Right foot pain after toe amputation a couple weeks ago. EXAM: RIGHT FOOT COMPLETE - 3+ VIEW COMPARISON:  02/11/2018 FINDINGS: Interval amputation of the third toe distal to the head of the metatarsal. Minimal associated air in the soft tissues. Small vessel atherosclerotic disease is present. There is mild degenerative change over the midfoot. There is a small inferior calcaneal spur. IMPRESSION:  Postsurgical change compatible with amputation of the third toe distal to the head of the metatarsal. Air in the adjacent soft tissues which may be postsurgical although cannot exclude infection. Electronically Signed   By: Elberta Fortis M.D.   On: 03/15/2018 18:58    Assessment/Plan:    #1-history of right below the knee amputation-he appears to have tolerated procedure well appears to be making an uneventful recovery-he will need continued PT and OT will need a wheelchair to assist with ambulation it appears he is receiving Zanaflex as well as ibuprofen as needed for pain  He will need follow-up by orthopedics.  2.  Peripheral vascular disease he is on aspirin Plavix and a statin.  3.  Chronic kidney disease-will update a BMP this appears to have stabilized with creatinine 0.8 on lab done on March 28, 2018 this was initially apparently in the hospital but resolved with hydration.  4.  Hypertension this appears relatively well controlled on Norvasc recent blood pressures 130/74-131/87-the lowest one that I see is 100/54.  5.  History of hyperlipidemia LDL was 137 he is on Lipitor this will warrant follow-up by primary care provider.  6.  History of type 2 diabetes he is on Levemir 5 units as well as Glucophage 1000 mg a day-CBGs appear to run largely from the 100s to 200s area more prominently later in the day since he is about to go home would be hesitant to aggressively change medications secondary to relative stability   #7 history of seizure disorder this is been stable on Keppra.  8.  History of anemia suspect there is a postop element to this he is on iron hemoglobin was 7.9 on March 30 we will update this before discharge will need follow-up by primary care provider at this point continue iron.  Clinically appears stable he will need continued PT and OT as well as nursing support for multiple medical issues he also would benefit from continued PT and OT and will need a nursing aide to  help with his activities of daily living.  He also will need a wheelchair to assist with ambulation again apparently prosthesis is in the process of being ordered  CPT- 99316-of note greater than 30 minutes spent on this discharge summary-greater than 50% time spent coordinating a plan of care for numerous diagnoses

## 2018-04-08 NOTE — Patient Outreach (Addendum)
[facility]) Care Management  04/08/2018  Rob Hickmanlvin Rindfleisch 09/24/1961 [id number]  Assessment- [facility] CSW completed call to [facility] and asked to speak to social worker. Social worker was unavailable at that time but [facility] CS[facility] was able to leave a message with front desk requesting a return call with discharge updates on patient.  Plan-[facility] CSW will await for return call and [facility] 04/[facility]18.  Dickie LaBrooke Walida Cajas, BSW, MSW, LCSW [facility] Care Management [email or web address] Phone: [phone] Fax: [phone]

## 2018-04-09 ENCOUNTER — Inpatient Hospital Stay (INDEPENDENT_AMBULATORY_CARE_PROVIDER_SITE_OTHER): Payer: PPO | Admitting: Orthopaedic Surgery

## 2018-04-10 ENCOUNTER — Other Ambulatory Visit: Payer: Self-pay | Admitting: Licensed Clinical Social Worker

## 2018-04-10 DIAGNOSIS — Z993 Dependence on wheelchair: Secondary | ICD-10-CM | POA: Diagnosis not present

## 2018-04-10 DIAGNOSIS — I10 Essential (primary) hypertension: Secondary | ICD-10-CM

## 2018-04-10 DIAGNOSIS — E119 Type 2 diabetes mellitus without complications: Secondary | ICD-10-CM

## 2018-04-10 DIAGNOSIS — Z9181 History of falling: Secondary | ICD-10-CM | POA: Diagnosis not present

## 2018-04-10 DIAGNOSIS — Z4781 Encounter for orthopedic aftercare following surgical amputation: Secondary | ICD-10-CM

## 2018-04-10 DIAGNOSIS — Z7982 Long term (current) use of aspirin: Secondary | ICD-10-CM | POA: Diagnosis not present

## 2018-04-10 DIAGNOSIS — Z8673 Personal history of transient ischemic attack (TIA), and cerebral infarction without residual deficits: Secondary | ICD-10-CM | POA: Diagnosis not present

## 2018-04-10 DIAGNOSIS — I252 Old myocardial infarction: Secondary | ICD-10-CM

## 2018-04-10 DIAGNOSIS — Z794 Long term (current) use of insulin: Secondary | ICD-10-CM | POA: Diagnosis not present

## 2018-04-10 DIAGNOSIS — Z7902 Long term (current) use of antithrombotics/antiplatelets: Secondary | ICD-10-CM | POA: Diagnosis not present

## 2018-04-10 DIAGNOSIS — Z89421 Acquired absence of other right toe(s): Secondary | ICD-10-CM | POA: Diagnosis not present

## 2018-04-10 DIAGNOSIS — Z89511 Acquired absence of right leg below knee: Secondary | ICD-10-CM | POA: Diagnosis not present

## 2018-04-10 NOTE — Patient Outreach (Signed)
[facility]) Care Management  04/10/2018  Luke Manning 08/26/1961 [id number]  [facility]. [facility] CSW did not received return call back from SNF social worker in order to gain additional discharge updates. [facility] CSW will sign off at this time and [facility].  [name], BSW, MSW, LCSW [facility] Care Management [name] Phone: [phone] Fax: [phone]

## 2018-04-11 ENCOUNTER — Other Ambulatory Visit: Payer: Self-pay | Admitting: *Deleted

## 2018-04-11 DIAGNOSIS — M6281 Muscle weakness (generalized): Secondary | ICD-10-CM | POA: Diagnosis not present

## 2018-04-11 DIAGNOSIS — Z4781 Encounter for orthopedic aftercare following surgical amputation: Secondary | ICD-10-CM | POA: Diagnosis not present

## 2018-04-11 DIAGNOSIS — Z89511 Acquired absence of right leg below knee: Secondary | ICD-10-CM | POA: Diagnosis not present

## 2018-04-11 DIAGNOSIS — R269 Unspecified abnormalities of gait and mobility: Secondary | ICD-10-CM | POA: Diagnosis not present

## 2018-04-11 NOTE — Patient Outreach (Signed)
[facility]) Care Management  04/11/2018  Luke Manning 12/08/1961 [id number]    Transition of care  RN spoke with pt's wife Luke Manning(Cathy) who indicated pt was indisposed at the time of this call. RN verified pt and idenifiers and offered to call back on Monday after introducing [facility] services and purpose for today's call. Wife requested a call back around 1:00 PM. Will follow up with another transition of care call on Monday.   Elliot CousinLisa Shaqueena Mauceri, RN Care Management Coordinator [facility] Main Office [phone]

## 2018-04-14 ENCOUNTER — Other Ambulatory Visit: Payer: Self-pay | Admitting: *Deleted

## 2018-04-14 ENCOUNTER — Encounter: Payer: Self-pay | Admitting: *Deleted

## 2018-04-14 DIAGNOSIS — Z993 Dependence on wheelchair: Secondary | ICD-10-CM | POA: Diagnosis not present

## 2018-04-14 DIAGNOSIS — Z89511 Acquired absence of right leg below knee: Secondary | ICD-10-CM | POA: Diagnosis not present

## 2018-04-14 DIAGNOSIS — Z794 Long term (current) use of insulin: Secondary | ICD-10-CM | POA: Diagnosis not present

## 2018-04-14 DIAGNOSIS — E119 Type 2 diabetes mellitus without complications: Secondary | ICD-10-CM | POA: Diagnosis not present

## 2018-04-14 DIAGNOSIS — I252 Old myocardial infarction: Secondary | ICD-10-CM | POA: Diagnosis not present

## 2018-04-14 DIAGNOSIS — Z8673 Personal history of transient ischemic attack (TIA), and cerebral infarction without residual deficits: Secondary | ICD-10-CM | POA: Diagnosis not present

## 2018-04-14 DIAGNOSIS — Z9181 History of falling: Secondary | ICD-10-CM | POA: Diagnosis not present

## 2018-04-14 DIAGNOSIS — Z7982 Long term (current) use of aspirin: Secondary | ICD-10-CM | POA: Diagnosis not present

## 2018-04-14 DIAGNOSIS — I10 Essential (primary) hypertension: Secondary | ICD-10-CM | POA: Diagnosis not present

## 2018-04-14 DIAGNOSIS — Z89421 Acquired absence of other right toe(s): Secondary | ICD-10-CM | POA: Diagnosis not present

## 2018-04-14 DIAGNOSIS — Z4781 Encounter for orthopedic aftercare following surgical amputation: Secondary | ICD-10-CM | POA: Diagnosis not present

## 2018-04-14 DIAGNOSIS — Z7902 Long term (current) use of antithrombotics/antiplatelets: Secondary | ICD-10-CM | POA: Diagnosis not present

## 2018-04-14 NOTE — Patient Outreach (Signed)
[facility]) Care Management  04/14/2018  Rob Hickmanlvin Balazs 10/30/1961 [id number]   Transition of care  RN called back today as requested from last week however wife not feeling good today and RN unable to completed the inquires via transition of care template or discussed pt's recent discharge and needs at this time. Spouse indicated she was "sick and may have to go to the hospital". RN offered to assist further however declined. RN offered to call back but no a date and time was not provider. RN informed spouse that this RN would call back one additional time to inquire further on this pt's. Today was another unsuccessful call. Therefore RN will send outreach letter and allow time for pt to respond. If no call back to made RN will make one additional call prior to case closure over the next week.  Elliot CousinLisa Kariah Loredo, RN Care Management Coordinator [facility] Main Office [phone]

## 2018-04-15 DIAGNOSIS — Z89511 Acquired absence of right leg below knee: Secondary | ICD-10-CM | POA: Diagnosis not present

## 2018-04-15 DIAGNOSIS — I10 Essential (primary) hypertension: Secondary | ICD-10-CM | POA: Diagnosis not present

## 2018-04-15 DIAGNOSIS — Z89421 Acquired absence of other right toe(s): Secondary | ICD-10-CM | POA: Diagnosis not present

## 2018-04-15 DIAGNOSIS — E119 Type 2 diabetes mellitus without complications: Secondary | ICD-10-CM | POA: Diagnosis not present

## 2018-04-15 DIAGNOSIS — Z7982 Long term (current) use of aspirin: Secondary | ICD-10-CM | POA: Diagnosis not present

## 2018-04-15 DIAGNOSIS — Z794 Long term (current) use of insulin: Secondary | ICD-10-CM | POA: Diagnosis not present

## 2018-04-15 DIAGNOSIS — Z993 Dependence on wheelchair: Secondary | ICD-10-CM | POA: Diagnosis not present

## 2018-04-15 DIAGNOSIS — Z4781 Encounter for orthopedic aftercare following surgical amputation: Secondary | ICD-10-CM | POA: Diagnosis not present

## 2018-04-15 DIAGNOSIS — Z7902 Long term (current) use of antithrombotics/antiplatelets: Secondary | ICD-10-CM | POA: Diagnosis not present

## 2018-04-15 DIAGNOSIS — Z8673 Personal history of transient ischemic attack (TIA), and cerebral infarction without residual deficits: Secondary | ICD-10-CM | POA: Diagnosis not present

## 2018-04-15 DIAGNOSIS — I252 Old myocardial infarction: Secondary | ICD-10-CM | POA: Diagnosis not present

## 2018-04-15 DIAGNOSIS — Z9181 History of falling: Secondary | ICD-10-CM | POA: Diagnosis not present

## 2018-04-21 ENCOUNTER — Other Ambulatory Visit: Payer: Self-pay | Admitting: *Deleted

## 2018-04-21 NOTE — Patient Outreach (Signed)
[facility]) Care Management  04/21/2018  Luke Manning 08/16/1961 [id number]    Transition of care (3rd unsuccessful outreach)  RN attempted to reach pt today for possible [facility] services however unsuccessful but was able to leave a HIPAA approved voice message requesting a call back. Will address pt's needs at that time.  Plan to close this case on 5/2 if no response to the outreach sent.   Elliot Cousin, RN Care Management Coordinator [facility] Main Office [phone]

## 2018-04-23 DIAGNOSIS — I10 Essential (primary) hypertension: Secondary | ICD-10-CM | POA: Diagnosis not present

## 2018-04-23 DIAGNOSIS — Z993 Dependence on wheelchair: Secondary | ICD-10-CM | POA: Diagnosis not present

## 2018-04-23 DIAGNOSIS — Z7902 Long term (current) use of antithrombotics/antiplatelets: Secondary | ICD-10-CM | POA: Diagnosis not present

## 2018-04-23 DIAGNOSIS — Z8673 Personal history of transient ischemic attack (TIA), and cerebral infarction without residual deficits: Secondary | ICD-10-CM | POA: Diagnosis not present

## 2018-04-23 DIAGNOSIS — I252 Old myocardial infarction: Secondary | ICD-10-CM | POA: Diagnosis not present

## 2018-04-23 DIAGNOSIS — Z89511 Acquired absence of right leg below knee: Secondary | ICD-10-CM | POA: Diagnosis not present

## 2018-04-23 DIAGNOSIS — Z7982 Long term (current) use of aspirin: Secondary | ICD-10-CM | POA: Diagnosis not present

## 2018-04-23 DIAGNOSIS — Z89421 Acquired absence of other right toe(s): Secondary | ICD-10-CM | POA: Diagnosis not present

## 2018-04-23 DIAGNOSIS — Z794 Long term (current) use of insulin: Secondary | ICD-10-CM | POA: Diagnosis not present

## 2018-04-23 DIAGNOSIS — E119 Type 2 diabetes mellitus without complications: Secondary | ICD-10-CM | POA: Diagnosis not present

## 2018-04-23 DIAGNOSIS — Z9181 History of falling: Secondary | ICD-10-CM | POA: Diagnosis not present

## 2018-04-23 DIAGNOSIS — Z4781 Encounter for orthopedic aftercare following surgical amputation: Secondary | ICD-10-CM | POA: Diagnosis not present

## 2018-04-25 ENCOUNTER — Encounter: Payer: Self-pay | Admitting: *Deleted

## 2018-04-25 ENCOUNTER — Other Ambulatory Visit: Payer: Self-pay | Admitting: *Deleted

## 2018-04-25 DIAGNOSIS — I10 Essential (primary) hypertension: Secondary | ICD-10-CM | POA: Diagnosis not present

## 2018-04-25 DIAGNOSIS — I119 Hypertensive heart disease without heart failure: Secondary | ICD-10-CM | POA: Diagnosis not present

## 2018-04-25 DIAGNOSIS — E7211 Homocystinuria: Secondary | ICD-10-CM | POA: Diagnosis not present

## 2018-04-25 DIAGNOSIS — G629 Polyneuropathy, unspecified: Secondary | ICD-10-CM | POA: Diagnosis not present

## 2018-04-25 DIAGNOSIS — N529 Male erectile dysfunction, unspecified: Secondary | ICD-10-CM | POA: Diagnosis not present

## 2018-04-25 DIAGNOSIS — N182 Chronic kidney disease, stage 2 (mild): Secondary | ICD-10-CM | POA: Diagnosis not present

## 2018-04-25 DIAGNOSIS — D649 Anemia, unspecified: Secondary | ICD-10-CM | POA: Diagnosis not present

## 2018-04-25 DIAGNOSIS — Z87891 Personal history of nicotine dependence: Secondary | ICD-10-CM | POA: Diagnosis not present

## 2018-04-25 DIAGNOSIS — E1165 Type 2 diabetes mellitus with hyperglycemia: Secondary | ICD-10-CM | POA: Diagnosis not present

## 2018-04-25 DIAGNOSIS — I639 Cerebral infarction, unspecified: Secondary | ICD-10-CM | POA: Diagnosis not present

## 2018-04-25 NOTE — Patient Outreach (Addendum)
[facility]) Care Management  04/25/2018  Luke Manning August 10, 1961 [id number]  Covering for assigned Care Management Coordinator, Luke Manning ,RN  Transition of care ( unsuccessful outreach)  Unsuccessful outreach call x 3 , no patient  response from outreach letter.   Plan Will close case, mark case as not active, unable to contact patient , and send PCP closure letter .    Luke Garibaldi, RN, [facility] Care Management,Care Management Coordinator  [phone]- Mobile [phone]- Toll Free Main Office

## 2018-04-28 DIAGNOSIS — Z89421 Acquired absence of other right toe(s): Secondary | ICD-10-CM | POA: Diagnosis not present

## 2018-04-28 DIAGNOSIS — Z7902 Long term (current) use of antithrombotics/antiplatelets: Secondary | ICD-10-CM | POA: Diagnosis not present

## 2018-04-28 DIAGNOSIS — Z7982 Long term (current) use of aspirin: Secondary | ICD-10-CM | POA: Diagnosis not present

## 2018-04-28 DIAGNOSIS — I10 Essential (primary) hypertension: Secondary | ICD-10-CM | POA: Diagnosis not present

## 2018-04-28 DIAGNOSIS — I252 Old myocardial infarction: Secondary | ICD-10-CM | POA: Diagnosis not present

## 2018-04-28 DIAGNOSIS — Z4781 Encounter for orthopedic aftercare following surgical amputation: Secondary | ICD-10-CM | POA: Diagnosis not present

## 2018-04-28 DIAGNOSIS — Z9181 History of falling: Secondary | ICD-10-CM | POA: Diagnosis not present

## 2018-04-28 DIAGNOSIS — Z993 Dependence on wheelchair: Secondary | ICD-10-CM | POA: Diagnosis not present

## 2018-04-28 DIAGNOSIS — Z794 Long term (current) use of insulin: Secondary | ICD-10-CM | POA: Diagnosis not present

## 2018-04-28 DIAGNOSIS — E119 Type 2 diabetes mellitus without complications: Secondary | ICD-10-CM | POA: Diagnosis not present

## 2018-04-28 DIAGNOSIS — Z8673 Personal history of transient ischemic attack (TIA), and cerebral infarction without residual deficits: Secondary | ICD-10-CM | POA: Diagnosis not present

## 2018-04-28 DIAGNOSIS — Z89511 Acquired absence of right leg below knee: Secondary | ICD-10-CM | POA: Diagnosis not present

## 2018-04-29 ENCOUNTER — Ambulatory Visit (INDEPENDENT_AMBULATORY_CARE_PROVIDER_SITE_OTHER): Payer: PPO | Admitting: Orthopaedic Surgery

## 2018-04-30 DIAGNOSIS — Z89421 Acquired absence of other right toe(s): Secondary | ICD-10-CM | POA: Diagnosis not present

## 2018-04-30 DIAGNOSIS — E119 Type 2 diabetes mellitus without complications: Secondary | ICD-10-CM | POA: Diagnosis not present

## 2018-04-30 DIAGNOSIS — Z993 Dependence on wheelchair: Secondary | ICD-10-CM | POA: Diagnosis not present

## 2018-04-30 DIAGNOSIS — Z9181 History of falling: Secondary | ICD-10-CM | POA: Diagnosis not present

## 2018-04-30 DIAGNOSIS — I252 Old myocardial infarction: Secondary | ICD-10-CM | POA: Diagnosis not present

## 2018-04-30 DIAGNOSIS — Z7982 Long term (current) use of aspirin: Secondary | ICD-10-CM | POA: Diagnosis not present

## 2018-04-30 DIAGNOSIS — I10 Essential (primary) hypertension: Secondary | ICD-10-CM | POA: Diagnosis not present

## 2018-04-30 DIAGNOSIS — Z8673 Personal history of transient ischemic attack (TIA), and cerebral infarction without residual deficits: Secondary | ICD-10-CM | POA: Diagnosis not present

## 2018-04-30 DIAGNOSIS — Z794 Long term (current) use of insulin: Secondary | ICD-10-CM | POA: Diagnosis not present

## 2018-04-30 DIAGNOSIS — Z4781 Encounter for orthopedic aftercare following surgical amputation: Secondary | ICD-10-CM | POA: Diagnosis not present

## 2018-04-30 DIAGNOSIS — Z89511 Acquired absence of right leg below knee: Secondary | ICD-10-CM | POA: Diagnosis not present

## 2018-04-30 DIAGNOSIS — Z7902 Long term (current) use of antithrombotics/antiplatelets: Secondary | ICD-10-CM | POA: Diagnosis not present

## 2018-05-01 DIAGNOSIS — Z8673 Personal history of transient ischemic attack (TIA), and cerebral infarction without residual deficits: Secondary | ICD-10-CM | POA: Diagnosis not present

## 2018-05-01 DIAGNOSIS — Z993 Dependence on wheelchair: Secondary | ICD-10-CM | POA: Diagnosis not present

## 2018-05-01 DIAGNOSIS — E119 Type 2 diabetes mellitus without complications: Secondary | ICD-10-CM | POA: Diagnosis not present

## 2018-05-01 DIAGNOSIS — Z89511 Acquired absence of right leg below knee: Secondary | ICD-10-CM | POA: Diagnosis not present

## 2018-05-01 DIAGNOSIS — Z4781 Encounter for orthopedic aftercare following surgical amputation: Secondary | ICD-10-CM | POA: Diagnosis not present

## 2018-05-01 DIAGNOSIS — Z7982 Long term (current) use of aspirin: Secondary | ICD-10-CM | POA: Diagnosis not present

## 2018-05-01 DIAGNOSIS — Z7902 Long term (current) use of antithrombotics/antiplatelets: Secondary | ICD-10-CM | POA: Diagnosis not present

## 2018-05-01 DIAGNOSIS — Z794 Long term (current) use of insulin: Secondary | ICD-10-CM | POA: Diagnosis not present

## 2018-05-01 DIAGNOSIS — Z9181 History of falling: Secondary | ICD-10-CM | POA: Diagnosis not present

## 2018-05-01 DIAGNOSIS — I10 Essential (primary) hypertension: Secondary | ICD-10-CM | POA: Diagnosis not present

## 2018-05-01 DIAGNOSIS — Z89421 Acquired absence of other right toe(s): Secondary | ICD-10-CM | POA: Diagnosis not present

## 2018-05-01 DIAGNOSIS — I252 Old myocardial infarction: Secondary | ICD-10-CM | POA: Diagnosis not present

## 2018-05-02 ENCOUNTER — Ambulatory Visit (INDEPENDENT_AMBULATORY_CARE_PROVIDER_SITE_OTHER): Payer: PPO | Admitting: Orthopaedic Surgery

## 2018-05-02 ENCOUNTER — Encounter (INDEPENDENT_AMBULATORY_CARE_PROVIDER_SITE_OTHER): Payer: Self-pay | Admitting: Orthopaedic Surgery

## 2018-05-02 DIAGNOSIS — IMO0002 Reserved for concepts with insufficient information to code with codable children: Secondary | ICD-10-CM | POA: Insufficient documentation

## 2018-05-02 DIAGNOSIS — Z89511 Acquired absence of right leg below knee: Secondary | ICD-10-CM

## 2018-05-02 NOTE — Progress Notes (Signed)
   Post-Op Visit Note   Patient: Luke Manning           Date of Birth: [birth year]           MRN: [id number] Visit Date: 05/02/2018 PCP: Jackie Plum, MD   Assessment & Plan: Post right total BKA. No drainage. Stump shrinker sock ordered. He requested narcotic medication and I discussed with him he should use tylenol and only occasionally ibuprofen.  Return 3 wks for possible suture removal.   Chief Complaint:  Chief Complaint  Patient presents with  . Right Leg - Routine Post Op   Visit Diagnoses:  1. Below knee amputation status, right (HCC)     Plan: Return in 3 weeks for possible suture removal  Follow-Up Instructions: Return in about 3 weeks (around 05/23/2018).   Orders:  No orders of the defined types were placed in this encounter.  No orders of the defined types were placed in this encounter.   Imaging: No results found.  PMFS History: Patient Active Problem List   Diagnosis Date Noted  . Below knee amputation status, right (HCC) 05/02/2018  . Controlled diabetes mellitus type 2 with complications (HCC) 03/26/2018  . Cellulitis of right lower extremity   . Wound infection 03/15/2018  . Diabetic foot infection (HCC) 03/15/2018  . HLD (hyperlipidemia) 03/15/2018  . PVD (peripheral vascular disease) (HCC) 03/15/2018  . CAD (coronary artery disease) 03/15/2018  . Iron deficiency anemia 03/15/2018  . Acute renal failure superimposed on stage 2 chronic kidney disease (HCC) 03/15/2018  . Sepsis (HCC) 03/15/2018  . Poorly controlled type 2 diabetes mellitus (HCC)   . Gangrene of toe of right foot (HCC) 02/12/2018  . Type II diabetes mellitus with renal manifestations (HCC) 02/12/2018  . HTN (hypertension) 02/12/2018  . Seizure disorder (HCC) 02/12/2018  . Ischemic pain of foot, right 02/11/2018  . Gangrene of foot (HCC) 02/11/2018   Past Medical History:  Diagnosis Date  . Diabetes mellitus without complication (HCC)   . Hypertension   . MI, old   .  Seizures (HCC)   . Stroke [facility])     Family History  Problem Relation Age of Onset  . Cancer Father   . Diabetes Neg Hx     Past Surgical History:  Procedure Laterality Date  . ABDOMINAL AORTOGRAM W/LOWER EXTREMITY N/A 02/14/2018   Procedure: ABDOMINAL AORTOGRAM W/LOWER EXTREMITY;  Surgeon: Sherren Kerns, MD;  [facility];  Service: Cardiovascular;  Laterality: N/A;  . AMPUTATION Right 02/17/2018   Procedure: AMPUTATION, RIGHT 3RD TOE;  Surgeon: Eldred Manges, MD;  Location: [facility] OR;  Service: Orthopedics;  Laterality: Right;  . AMPUTATION Right 03/19/2018   Procedure: AMPUTATION BELOW KNEE;  Surgeon: Eldred Manges, MD;  [facility]: [facility] OR;  Service: Orthopedics;  Laterality: Right;  . PERIPHERAL VASCULAR BALLOON ANGIOPLASTY Right 02/14/2018   Procedure: PERIPHERAL VASCULAR BALLOON ANGIOPLASTY;  Surgeon: Sherren Kerns, MD;  [facility];  Service: Cardiovascular;  Laterality: Right;  sfa   Social History   Occupational History  . Not on file  Tobacco Use  . Smoking status: Never Smoker  . Smokeless tobacco: Former Engineer, water and Sexual Activity  . Alcohol use: No    Frequency: Never  . Drug use: No  . Sexual activity: Not on file

## 2018-05-05 DIAGNOSIS — I10 Essential (primary) hypertension: Secondary | ICD-10-CM | POA: Diagnosis not present

## 2018-05-05 DIAGNOSIS — Z993 Dependence on wheelchair: Secondary | ICD-10-CM | POA: Diagnosis not present

## 2018-05-05 DIAGNOSIS — E119 Type 2 diabetes mellitus without complications: Secondary | ICD-10-CM | POA: Diagnosis not present

## 2018-05-05 DIAGNOSIS — Z7902 Long term (current) use of antithrombotics/antiplatelets: Secondary | ICD-10-CM | POA: Diagnosis not present

## 2018-05-05 DIAGNOSIS — Z89511 Acquired absence of right leg below knee: Secondary | ICD-10-CM | POA: Diagnosis not present

## 2018-05-05 DIAGNOSIS — Z7982 Long term (current) use of aspirin: Secondary | ICD-10-CM | POA: Diagnosis not present

## 2018-05-05 DIAGNOSIS — Z4781 Encounter for orthopedic aftercare following surgical amputation: Secondary | ICD-10-CM | POA: Diagnosis not present

## 2018-05-05 DIAGNOSIS — Z8673 Personal history of transient ischemic attack (TIA), and cerebral infarction without residual deficits: Secondary | ICD-10-CM | POA: Diagnosis not present

## 2018-05-05 DIAGNOSIS — Z89421 Acquired absence of other right toe(s): Secondary | ICD-10-CM | POA: Diagnosis not present

## 2018-05-05 DIAGNOSIS — I252 Old myocardial infarction: Secondary | ICD-10-CM | POA: Diagnosis not present

## 2018-05-05 DIAGNOSIS — Z794 Long term (current) use of insulin: Secondary | ICD-10-CM | POA: Diagnosis not present

## 2018-05-05 DIAGNOSIS — Z9181 History of falling: Secondary | ICD-10-CM | POA: Diagnosis not present

## 2018-05-08 DIAGNOSIS — Z993 Dependence on wheelchair: Secondary | ICD-10-CM | POA: Diagnosis not present

## 2018-05-08 DIAGNOSIS — Z4781 Encounter for orthopedic aftercare following surgical amputation: Secondary | ICD-10-CM | POA: Diagnosis not present

## 2018-05-08 DIAGNOSIS — Z7902 Long term (current) use of antithrombotics/antiplatelets: Secondary | ICD-10-CM | POA: Diagnosis not present

## 2018-05-08 DIAGNOSIS — Z9181 History of falling: Secondary | ICD-10-CM | POA: Diagnosis not present

## 2018-05-08 DIAGNOSIS — I10 Essential (primary) hypertension: Secondary | ICD-10-CM | POA: Diagnosis not present

## 2018-05-08 DIAGNOSIS — I252 Old myocardial infarction: Secondary | ICD-10-CM | POA: Diagnosis not present

## 2018-05-08 DIAGNOSIS — E119 Type 2 diabetes mellitus without complications: Secondary | ICD-10-CM | POA: Diagnosis not present

## 2018-05-08 DIAGNOSIS — Z8673 Personal history of transient ischemic attack (TIA), and cerebral infarction without residual deficits: Secondary | ICD-10-CM | POA: Diagnosis not present

## 2018-05-08 DIAGNOSIS — Z89421 Acquired absence of other right toe(s): Secondary | ICD-10-CM | POA: Diagnosis not present

## 2018-05-08 DIAGNOSIS — Z794 Long term (current) use of insulin: Secondary | ICD-10-CM | POA: Diagnosis not present

## 2018-05-08 DIAGNOSIS — Z7982 Long term (current) use of aspirin: Secondary | ICD-10-CM | POA: Diagnosis not present

## 2018-05-08 DIAGNOSIS — Z89511 Acquired absence of right leg below knee: Secondary | ICD-10-CM | POA: Diagnosis not present

## 2018-05-11 DIAGNOSIS — M6281 Muscle weakness (generalized): Secondary | ICD-10-CM | POA: Diagnosis not present

## 2018-05-11 DIAGNOSIS — Z4781 Encounter for orthopedic aftercare following surgical amputation: Secondary | ICD-10-CM | POA: Diagnosis not present

## 2018-05-11 DIAGNOSIS — R269 Unspecified abnormalities of gait and mobility: Secondary | ICD-10-CM | POA: Diagnosis not present

## 2018-05-11 DIAGNOSIS — Z89511 Acquired absence of right leg below knee: Secondary | ICD-10-CM | POA: Diagnosis not present

## 2018-05-13 ENCOUNTER — Other Ambulatory Visit: Payer: Self-pay | Admitting: Internal Medicine

## 2018-05-13 DIAGNOSIS — Z89511 Acquired absence of right leg below knee: Secondary | ICD-10-CM | POA: Diagnosis not present

## 2018-05-13 DIAGNOSIS — Z794 Long term (current) use of insulin: Secondary | ICD-10-CM | POA: Diagnosis not present

## 2018-05-13 DIAGNOSIS — Z4781 Encounter for orthopedic aftercare following surgical amputation: Secondary | ICD-10-CM | POA: Diagnosis not present

## 2018-05-13 DIAGNOSIS — Z7982 Long term (current) use of aspirin: Secondary | ICD-10-CM | POA: Diagnosis not present

## 2018-05-13 DIAGNOSIS — Z7902 Long term (current) use of antithrombotics/antiplatelets: Secondary | ICD-10-CM | POA: Diagnosis not present

## 2018-05-13 DIAGNOSIS — Z89421 Acquired absence of other right toe(s): Secondary | ICD-10-CM | POA: Diagnosis not present

## 2018-05-13 DIAGNOSIS — Z8673 Personal history of transient ischemic attack (TIA), and cerebral infarction without residual deficits: Secondary | ICD-10-CM | POA: Diagnosis not present

## 2018-05-13 DIAGNOSIS — E119 Type 2 diabetes mellitus without complications: Secondary | ICD-10-CM | POA: Diagnosis not present

## 2018-05-13 DIAGNOSIS — I10 Essential (primary) hypertension: Secondary | ICD-10-CM | POA: Diagnosis not present

## 2018-05-13 DIAGNOSIS — Z9181 History of falling: Secondary | ICD-10-CM | POA: Diagnosis not present

## 2018-05-13 DIAGNOSIS — Z993 Dependence on wheelchair: Secondary | ICD-10-CM | POA: Diagnosis not present

## 2018-05-13 DIAGNOSIS — I252 Old myocardial infarction: Secondary | ICD-10-CM | POA: Diagnosis not present

## 2018-05-16 ENCOUNTER — Other Ambulatory Visit: Payer: Self-pay | Admitting: Internal Medicine

## 2018-05-17 ENCOUNTER — Other Ambulatory Visit: Payer: Self-pay | Admitting: Internal Medicine

## 2018-05-20 DIAGNOSIS — Z794 Long term (current) use of insulin: Secondary | ICD-10-CM | POA: Diagnosis not present

## 2018-05-20 DIAGNOSIS — I10 Essential (primary) hypertension: Secondary | ICD-10-CM | POA: Diagnosis not present

## 2018-05-20 DIAGNOSIS — Z4781 Encounter for orthopedic aftercare following surgical amputation: Secondary | ICD-10-CM | POA: Diagnosis not present

## 2018-05-20 DIAGNOSIS — Z993 Dependence on wheelchair: Secondary | ICD-10-CM | POA: Diagnosis not present

## 2018-05-20 DIAGNOSIS — Z7902 Long term (current) use of antithrombotics/antiplatelets: Secondary | ICD-10-CM | POA: Diagnosis not present

## 2018-05-20 DIAGNOSIS — E119 Type 2 diabetes mellitus without complications: Secondary | ICD-10-CM | POA: Diagnosis not present

## 2018-05-20 DIAGNOSIS — Z8673 Personal history of transient ischemic attack (TIA), and cerebral infarction without residual deficits: Secondary | ICD-10-CM | POA: Diagnosis not present

## 2018-05-20 DIAGNOSIS — Z89421 Acquired absence of other right toe(s): Secondary | ICD-10-CM | POA: Diagnosis not present

## 2018-05-20 DIAGNOSIS — Z89511 Acquired absence of right leg below knee: Secondary | ICD-10-CM | POA: Diagnosis not present

## 2018-05-20 DIAGNOSIS — I252 Old myocardial infarction: Secondary | ICD-10-CM | POA: Diagnosis not present

## 2018-05-20 DIAGNOSIS — Z9181 History of falling: Secondary | ICD-10-CM | POA: Diagnosis not present

## 2018-05-20 DIAGNOSIS — Z7982 Long term (current) use of aspirin: Secondary | ICD-10-CM | POA: Diagnosis not present

## 2018-05-22 ENCOUNTER — Other Ambulatory Visit: Payer: Self-pay | Admitting: Internal Medicine

## 2018-05-23 ENCOUNTER — Encounter (INDEPENDENT_AMBULATORY_CARE_PROVIDER_SITE_OTHER): Payer: Self-pay | Admitting: Orthopaedic Surgery

## 2018-05-23 ENCOUNTER — Ambulatory Visit (INDEPENDENT_AMBULATORY_CARE_PROVIDER_SITE_OTHER): Payer: PPO | Admitting: Orthopaedic Surgery

## 2018-05-23 VITALS — BP 129/82 | HR 104 | Ht 62.0 in | Wt 115.0 lb

## 2018-05-23 DIAGNOSIS — Z89511 Acquired absence of right leg below knee: Secondary | ICD-10-CM

## 2018-05-23 NOTE — Progress Notes (Signed)
Post-Op Visit Note   Patient: Luke Manning           Date of Birth: 02/25/1961           MRN: [id number] Visit Date: 05/23/2018 PCP: Jackie Plumsei-Bonsu, George, MD   Assessment & Plan: Post right BKA.  Sutures are harvested.  He has not been taking insulin and is only taking metformin.  He is not taking his sugars and I had a long discussion with him that he will end up losing his other leg if he does not start taking his insulin and check his sugars as he supposed to.  Patient once again asked for pain medication I discussed with him he does not need pain medication.  His painful necrotic foot is been surgically treated with a below-knee amputation and needs to take his insulin, get his prosthetic fitted and get to walking.  Chief Complaint:  Chief Complaint  Patient presents with  . Right Knee - Routine Post Op   Visit Diagnoses: Post right BKA  Plan: Sutures removed he can follow-up as needed.  I discussed with his daughter is here since he feels like he is unable to give himself insulin shots they should talk and see if the patient's wife can give him the insulin shots.  I discussed with him that if he does not take his insulin with his noncompliance and past A1c in greater than 11 he is in for more surgery and is at risk for severe diabetic complications including blindness progression of his neuropathy heart attack stroke and death.  Patient states that he promises he will start taking his insulin and will do better.  He has appointment coming up next week with his PCP.  Follow-Up Instructions: No follow-ups on file.   Orders:  No orders of the defined types were placed in this encounter.  No orders of the defined types were placed in this encounter.   Imaging: No results found.  PMFS History: Patient Active Problem List   Diagnosis Date Noted  . Below knee amputation status, right (HCC) 05/02/2018  . Controlled diabetes mellitus type 2 with complications (HCC) 03/26/2018  .  Cellulitis of right lower extremity   . Wound infection 03/15/2018  . Diabetic foot infection (HCC) 03/15/2018  . HLD (hyperlipidemia) 03/15/2018  . PVD (peripheral vascular disease) (HCC) 03/15/2018  . CAD (coronary artery disease) 03/15/2018  . Iron deficiency anemia 03/15/2018  . Acute renal failure superimposed on stage 2 chronic kidney disease (HCC) 03/15/2018  . Sepsis (HCC) 03/15/2018  . Poorly controlled type 2 diabetes mellitus (HCC)   . Type II diabetes mellitus with renal manifestations (HCC) 02/12/2018  . HTN (hypertension) 02/12/2018  . Seizure disorder (HCC) 02/12/2018   Past Medical History:  Diagnosis Date  . Diabetes mellitus without complication (HCC)   . Hypertension   . MI, old   . Seizures (HCC)   . Stroke [facility](HCC)     Family History  Problem Relation Age of Onset  . Cancer Father   . Diabetes Neg Hx     Past Surgical History:  Procedure Laterality Date  . ABDOMINAL AORTOGRAM W/LOWER EXTREMITY N/A 02/14/2018   Procedure: ABDOMINAL AORTOGRAM W/LOWER EXTREMITY;  Surgeon: Sherren KernsFields, Charles E, MD;  Location: [facility];  Service: Cardiovascular;  Laterality: N/A;  . AMPUTATION Right 02/17/2018   Procedure: AMPUTATION, RIGHT 3RD TOE;  Surgeon: Eldred MangesYates, Ashish Rossetti C, MD;  Location: [facility] OR;  Service: Orthopedics;  Laterality: Right;  . AMPUTATION Right 03/19/2018  Procedure: AMPUTATION BELOW KNEE;  Surgeon: Eldred Manges, MD;  Location: [facility] OR;  Service: Orthopedics;  Laterality: Right;  . PERIPHERAL VASCULAR BALLOON ANGIOPLASTY Right 02/14/2018   Procedure: PERIPHERAL VASCULAR BALLOON ANGIOPLASTY;  Surgeon: Sherren Kerns, MD;  Location: [facility];  Service: Cardiovascular;  Laterality: Right;  sfa   Social History   Occupational History  . Not on file  Tobacco Use  . Smoking status: Never Smoker  . Smokeless tobacco: Former Engineer, water and Sexual Activity  . Alcohol use: No    Frequency: Never  . Drug use: No  . Sexual activity: Not on file

## 2018-06-05 ENCOUNTER — Telehealth (INDEPENDENT_AMBULATORY_CARE_PROVIDER_SITE_OTHER): Payer: Self-pay | Admitting: Orthopaedic Surgery

## 2018-06-05 DIAGNOSIS — Z89511 Acquired absence of right leg below knee: Secondary | ICD-10-CM

## 2018-06-05 NOTE — Telephone Encounter (Signed)
Ok for Rx

## 2018-06-05 NOTE — Telephone Encounter (Signed)
oK THANKS 

## 2018-06-05 NOTE — Telephone Encounter (Signed)
Kathy from [employer] called asking for a RX for PT gate training. CB # [phone] °

## 2018-06-06 NOTE — Telephone Encounter (Signed)
Patient script faxed to Olegario MessierKathy at [facility] at [phone]. She is going to get the script to the facility for the patient.

## 2018-06-06 NOTE — Addendum Note (Signed)
Addended by: Rogers SeedsYEATTS, Vonya Ohalloran M on: 06/06/2018 09:06 AM   Modules accepted: Orders

## 2018-06-11 DIAGNOSIS — M6281 Muscle weakness (generalized): Secondary | ICD-10-CM | POA: Diagnosis not present

## 2018-06-11 DIAGNOSIS — Z4781 Encounter for orthopedic aftercare following surgical amputation: Secondary | ICD-10-CM | POA: Diagnosis not present

## 2018-06-11 DIAGNOSIS — R269 Unspecified abnormalities of gait and mobility: Secondary | ICD-10-CM | POA: Diagnosis not present

## 2018-06-11 DIAGNOSIS — Z89511 Acquired absence of right leg below knee: Secondary | ICD-10-CM | POA: Diagnosis not present

## 2018-06-17 ENCOUNTER — Encounter (INDEPENDENT_AMBULATORY_CARE_PROVIDER_SITE_OTHER): Payer: Self-pay | Admitting: Orthopaedic Surgery

## 2018-06-17 ENCOUNTER — Ambulatory Visit (INDEPENDENT_AMBULATORY_CARE_PROVIDER_SITE_OTHER): Payer: PPO | Admitting: Orthopaedic Surgery

## 2018-06-17 VITALS — BP 146/89 | HR 85 | Ht 62.0 in | Wt 115.0 lb

## 2018-06-17 DIAGNOSIS — IMO0002 Reserved for concepts with insufficient information to code with codable children: Secondary | ICD-10-CM

## 2018-06-17 DIAGNOSIS — Z89511 Acquired absence of right leg below knee: Secondary | ICD-10-CM

## 2018-06-17 NOTE — Progress Notes (Signed)
Follow-up right BKA.  One small eschar removed from the incision stump is well-healed and prescription given for BK prosthesis.  Office follow-up here on a as needed basis.  We discussed the importance of compliance and removing his prosthesis once it finally done to check his stump carefully as recommended for several days to check for any red areas or hotspots.

## 2018-06-22 ENCOUNTER — Other Ambulatory Visit: Payer: Self-pay | Admitting: Internal Medicine

## 2018-07-25 DIAGNOSIS — Z89511 Acquired absence of right leg below knee: Secondary | ICD-10-CM | POA: Diagnosis not present

## 2018-08-08 DIAGNOSIS — R109 Unspecified abdominal pain: Secondary | ICD-10-CM | POA: Diagnosis not present

## 2018-08-08 DIAGNOSIS — N201 Calculus of ureter: Secondary | ICD-10-CM | POA: Diagnosis not present

## 2018-08-08 DIAGNOSIS — R112 Nausea with vomiting, unspecified: Secondary | ICD-10-CM | POA: Diagnosis not present

## 2018-08-08 DIAGNOSIS — R1031 Right lower quadrant pain: Secondary | ICD-10-CM | POA: Diagnosis not present

## 2018-08-27 DIAGNOSIS — M545 Low back pain: Secondary | ICD-10-CM | POA: Diagnosis not present

## 2018-08-27 DIAGNOSIS — G894 Chronic pain syndrome: Secondary | ICD-10-CM | POA: Diagnosis not present

## 2018-08-27 DIAGNOSIS — M792 Neuralgia and neuritis, unspecified: Secondary | ICD-10-CM | POA: Diagnosis not present

## 2018-08-27 DIAGNOSIS — M461 Sacroiliitis, not elsewhere classified: Secondary | ICD-10-CM | POA: Diagnosis not present

## 2018-08-27 DIAGNOSIS — G8929 Other chronic pain: Secondary | ICD-10-CM | POA: Diagnosis not present

## 2018-09-10 ENCOUNTER — Other Ambulatory Visit: Payer: Self-pay | Admitting: Internal Medicine

## 2018-09-29 DIAGNOSIS — E1136 Type 2 diabetes mellitus with diabetic cataract: Secondary | ICD-10-CM | POA: Diagnosis not present

## 2018-09-29 DIAGNOSIS — H547 Unspecified visual loss: Secondary | ICD-10-CM | POA: Diagnosis not present

## 2018-09-29 DIAGNOSIS — G8929 Other chronic pain: Secondary | ICD-10-CM | POA: Diagnosis not present

## 2018-09-29 DIAGNOSIS — R69 Illness, unspecified: Secondary | ICD-10-CM | POA: Diagnosis not present

## 2018-09-29 DIAGNOSIS — R569 Unspecified convulsions: Secondary | ICD-10-CM | POA: Diagnosis not present

## 2018-09-29 DIAGNOSIS — Z89511 Acquired absence of right leg below knee: Secondary | ICD-10-CM | POA: Diagnosis not present

## 2018-09-29 DIAGNOSIS — I10 Essential (primary) hypertension: Secondary | ICD-10-CM | POA: Diagnosis not present

## 2018-09-29 DIAGNOSIS — I252 Old myocardial infarction: Secondary | ICD-10-CM | POA: Diagnosis not present

## 2018-09-29 DIAGNOSIS — E1142 Type 2 diabetes mellitus with diabetic polyneuropathy: Secondary | ICD-10-CM | POA: Diagnosis not present

## 2018-11-17 DIAGNOSIS — G629 Polyneuropathy, unspecified: Secondary | ICD-10-CM | POA: Diagnosis not present

## 2018-11-17 DIAGNOSIS — E1165 Type 2 diabetes mellitus with hyperglycemia: Secondary | ICD-10-CM | POA: Diagnosis not present

## 2018-11-17 DIAGNOSIS — D649 Anemia, unspecified: Secondary | ICD-10-CM | POA: Diagnosis not present

## 2018-11-17 DIAGNOSIS — N182 Chronic kidney disease, stage 2 (mild): Secondary | ICD-10-CM | POA: Diagnosis not present

## 2018-11-17 DIAGNOSIS — I10 Essential (primary) hypertension: Secondary | ICD-10-CM | POA: Diagnosis not present

## 2018-11-17 DIAGNOSIS — Z87891 Personal history of nicotine dependence: Secondary | ICD-10-CM | POA: Diagnosis not present

## 2018-11-17 DIAGNOSIS — Z0001 Encounter for general adult medical examination with abnormal findings: Secondary | ICD-10-CM | POA: Diagnosis not present

## 2018-11-17 DIAGNOSIS — I639 Cerebral infarction, unspecified: Secondary | ICD-10-CM | POA: Diagnosis not present

## 2018-11-17 DIAGNOSIS — E7211 Homocystinuria: Secondary | ICD-10-CM | POA: Diagnosis not present

## 2018-11-17 DIAGNOSIS — I119 Hypertensive heart disease without heart failure: Secondary | ICD-10-CM | POA: Diagnosis not present

## 2018-11-24 DIAGNOSIS — E1165 Type 2 diabetes mellitus with hyperglycemia: Secondary | ICD-10-CM | POA: Diagnosis not present

## 2018-11-24 DIAGNOSIS — I119 Hypertensive heart disease without heart failure: Secondary | ICD-10-CM | POA: Diagnosis not present

## 2018-11-24 DIAGNOSIS — E782 Mixed hyperlipidemia: Secondary | ICD-10-CM | POA: Diagnosis not present

## 2018-11-24 DIAGNOSIS — I10 Essential (primary) hypertension: Secondary | ICD-10-CM | POA: Diagnosis not present

## 2018-11-24 DIAGNOSIS — N182 Chronic kidney disease, stage 2 (mild): Secondary | ICD-10-CM | POA: Diagnosis not present

## 2018-11-24 DIAGNOSIS — Z87891 Personal history of nicotine dependence: Secondary | ICD-10-CM | POA: Diagnosis not present

## 2018-11-24 DIAGNOSIS — I639 Cerebral infarction, unspecified: Secondary | ICD-10-CM | POA: Diagnosis not present

## 2018-11-24 DIAGNOSIS — G629 Polyneuropathy, unspecified: Secondary | ICD-10-CM | POA: Diagnosis not present

## 2018-11-24 DIAGNOSIS — E7211 Homocystinuria: Secondary | ICD-10-CM | POA: Diagnosis not present

## 2018-11-24 DIAGNOSIS — D649 Anemia, unspecified: Secondary | ICD-10-CM | POA: Diagnosis not present

## 2019-01-08 DIAGNOSIS — E782 Mixed hyperlipidemia: Secondary | ICD-10-CM | POA: Diagnosis not present

## 2019-01-08 DIAGNOSIS — D649 Anemia, unspecified: Secondary | ICD-10-CM | POA: Diagnosis not present

## 2019-01-08 DIAGNOSIS — G629 Polyneuropathy, unspecified: Secondary | ICD-10-CM | POA: Diagnosis not present

## 2019-01-08 DIAGNOSIS — E1165 Type 2 diabetes mellitus with hyperglycemia: Secondary | ICD-10-CM | POA: Diagnosis not present

## 2019-01-08 DIAGNOSIS — I119 Hypertensive heart disease without heart failure: Secondary | ICD-10-CM | POA: Diagnosis not present

## 2019-01-08 DIAGNOSIS — E7211 Homocystinuria: Secondary | ICD-10-CM | POA: Diagnosis not present

## 2019-01-08 DIAGNOSIS — Z87891 Personal history of nicotine dependence: Secondary | ICD-10-CM | POA: Diagnosis not present

## 2019-01-08 DIAGNOSIS — I10 Essential (primary) hypertension: Secondary | ICD-10-CM | POA: Diagnosis not present

## 2019-01-08 DIAGNOSIS — N182 Chronic kidney disease, stage 2 (mild): Secondary | ICD-10-CM | POA: Diagnosis not present

## 2019-01-08 DIAGNOSIS — I639 Cerebral infarction, unspecified: Secondary | ICD-10-CM | POA: Diagnosis not present

## 2019-02-02 DIAGNOSIS — E1140 Type 2 diabetes mellitus with diabetic neuropathy, unspecified: Secondary | ICD-10-CM | POA: Diagnosis not present

## 2019-02-02 DIAGNOSIS — G894 Chronic pain syndrome: Secondary | ICD-10-CM | POA: Diagnosis not present

## 2019-02-02 DIAGNOSIS — M545 Low back pain: Secondary | ICD-10-CM | POA: Diagnosis not present

## 2019-02-02 DIAGNOSIS — Z89511 Acquired absence of right leg below knee: Secondary | ICD-10-CM | POA: Diagnosis not present

## 2019-02-16 DIAGNOSIS — M545 Low back pain: Secondary | ICD-10-CM | POA: Diagnosis not present

## 2019-02-16 DIAGNOSIS — G894 Chronic pain syndrome: Secondary | ICD-10-CM | POA: Diagnosis not present

## 2019-02-16 DIAGNOSIS — Z89511 Acquired absence of right leg below knee: Secondary | ICD-10-CM | POA: Diagnosis not present

## 2019-02-16 DIAGNOSIS — E1140 Type 2 diabetes mellitus with diabetic neuropathy, unspecified: Secondary | ICD-10-CM | POA: Diagnosis not present

## 2019-03-30 DIAGNOSIS — M545 Low back pain: Secondary | ICD-10-CM | POA: Diagnosis not present

## 2019-03-30 DIAGNOSIS — E1140 Type 2 diabetes mellitus with diabetic neuropathy, unspecified: Secondary | ICD-10-CM | POA: Diagnosis not present

## 2019-03-30 DIAGNOSIS — Z89511 Acquired absence of right leg below knee: Secondary | ICD-10-CM | POA: Diagnosis not present

## 2019-03-30 DIAGNOSIS — G894 Chronic pain syndrome: Secondary | ICD-10-CM | POA: Diagnosis not present

## 2019-04-20 DIAGNOSIS — N182 Chronic kidney disease, stage 2 (mild): Secondary | ICD-10-CM | POA: Diagnosis not present

## 2019-04-20 DIAGNOSIS — I639 Cerebral infarction, unspecified: Secondary | ICD-10-CM | POA: Diagnosis not present

## 2019-04-20 DIAGNOSIS — I10 Essential (primary) hypertension: Secondary | ICD-10-CM | POA: Diagnosis not present

## 2019-04-20 DIAGNOSIS — E1165 Type 2 diabetes mellitus with hyperglycemia: Secondary | ICD-10-CM | POA: Diagnosis not present

## 2019-04-20 DIAGNOSIS — E782 Mixed hyperlipidemia: Secondary | ICD-10-CM | POA: Diagnosis not present

## 2019-04-20 DIAGNOSIS — Z1329 Encounter for screening for other suspected endocrine disorder: Secondary | ICD-10-CM | POA: Diagnosis not present

## 2019-04-20 DIAGNOSIS — I119 Hypertensive heart disease without heart failure: Secondary | ICD-10-CM | POA: Diagnosis not present

## 2019-04-20 DIAGNOSIS — Z1389 Encounter for screening for other disorder: Secondary | ICD-10-CM | POA: Diagnosis not present

## 2019-04-20 DIAGNOSIS — Z01021 Encounter for examination of eyes and vision following failed vision screening with abnormal findings: Secondary | ICD-10-CM | POA: Diagnosis not present

## 2019-04-20 DIAGNOSIS — Z01118 Encounter for examination of ears and hearing with other abnormal findings: Secondary | ICD-10-CM | POA: Diagnosis not present

## 2019-04-20 DIAGNOSIS — Z131 Encounter for screening for diabetes mellitus: Secondary | ICD-10-CM | POA: Diagnosis not present

## 2019-04-20 DIAGNOSIS — Z136 Encounter for screening for cardiovascular disorders: Secondary | ICD-10-CM | POA: Diagnosis not present

## 2019-04-20 DIAGNOSIS — Z125 Encounter for screening for malignant neoplasm of prostate: Secondary | ICD-10-CM | POA: Diagnosis not present

## 2019-04-20 DIAGNOSIS — Z5181 Encounter for therapeutic drug level monitoring: Secondary | ICD-10-CM | POA: Diagnosis not present

## 2019-04-20 DIAGNOSIS — Z0001 Encounter for general adult medical examination with abnormal findings: Secondary | ICD-10-CM | POA: Diagnosis not present

## 2019-04-27 DIAGNOSIS — Z89511 Acquired absence of right leg below knee: Secondary | ICD-10-CM | POA: Diagnosis not present

## 2019-04-27 DIAGNOSIS — G894 Chronic pain syndrome: Secondary | ICD-10-CM | POA: Diagnosis not present

## 2019-04-27 DIAGNOSIS — M545 Low back pain: Secondary | ICD-10-CM | POA: Diagnosis not present

## 2019-04-27 DIAGNOSIS — E1140 Type 2 diabetes mellitus with diabetic neuropathy, unspecified: Secondary | ICD-10-CM | POA: Diagnosis not present

## 2019-05-25 DIAGNOSIS — G894 Chronic pain syndrome: Secondary | ICD-10-CM | POA: Diagnosis not present

## 2019-05-25 DIAGNOSIS — M545 Low back pain: Secondary | ICD-10-CM | POA: Diagnosis not present

## 2019-05-25 DIAGNOSIS — Z89511 Acquired absence of right leg below knee: Secondary | ICD-10-CM | POA: Diagnosis not present

## 2019-05-25 DIAGNOSIS — E1140 Type 2 diabetes mellitus with diabetic neuropathy, unspecified: Secondary | ICD-10-CM | POA: Diagnosis not present

## 2019-06-01 DIAGNOSIS — G629 Polyneuropathy, unspecified: Secondary | ICD-10-CM | POA: Diagnosis not present

## 2019-06-01 DIAGNOSIS — E1165 Type 2 diabetes mellitus with hyperglycemia: Secondary | ICD-10-CM | POA: Diagnosis not present

## 2019-06-01 DIAGNOSIS — E7211 Homocystinuria: Secondary | ICD-10-CM | POA: Diagnosis not present

## 2019-06-01 DIAGNOSIS — I10 Essential (primary) hypertension: Secondary | ICD-10-CM | POA: Diagnosis not present

## 2019-06-01 DIAGNOSIS — N182 Chronic kidney disease, stage 2 (mild): Secondary | ICD-10-CM | POA: Diagnosis not present

## 2019-06-01 DIAGNOSIS — I639 Cerebral infarction, unspecified: Secondary | ICD-10-CM | POA: Diagnosis not present

## 2019-06-01 DIAGNOSIS — I119 Hypertensive heart disease without heart failure: Secondary | ICD-10-CM | POA: Diagnosis not present

## 2019-06-01 DIAGNOSIS — E782 Mixed hyperlipidemia: Secondary | ICD-10-CM | POA: Diagnosis not present

## 2019-06-05 DIAGNOSIS — Z89511 Acquired absence of right leg below knee: Secondary | ICD-10-CM | POA: Diagnosis not present

## 2019-06-05 DIAGNOSIS — E1142 Type 2 diabetes mellitus with diabetic polyneuropathy: Secondary | ICD-10-CM | POA: Diagnosis not present

## 2019-06-05 DIAGNOSIS — G8929 Other chronic pain: Secondary | ICD-10-CM | POA: Diagnosis not present

## 2019-06-05 DIAGNOSIS — E785 Hyperlipidemia, unspecified: Secondary | ICD-10-CM | POA: Diagnosis not present

## 2019-06-05 DIAGNOSIS — I10 Essential (primary) hypertension: Secondary | ICD-10-CM | POA: Diagnosis not present

## 2019-06-05 DIAGNOSIS — G40909 Epilepsy, unspecified, not intractable, without status epilepticus: Secondary | ICD-10-CM | POA: Diagnosis not present

## 2019-06-05 DIAGNOSIS — Z7982 Long term (current) use of aspirin: Secondary | ICD-10-CM | POA: Diagnosis not present

## 2019-06-05 DIAGNOSIS — I252 Old myocardial infarction: Secondary | ICD-10-CM | POA: Diagnosis not present

## 2019-06-05 DIAGNOSIS — N400 Enlarged prostate without lower urinary tract symptoms: Secondary | ICD-10-CM | POA: Diagnosis not present

## 2019-06-05 DIAGNOSIS — H547 Unspecified visual loss: Secondary | ICD-10-CM | POA: Diagnosis not present

## 2019-06-17 ENCOUNTER — Other Ambulatory Visit: Payer: Self-pay | Admitting: Internal Medicine

## 2019-06-18 ENCOUNTER — Other Ambulatory Visit: Payer: Self-pay | Admitting: Internal Medicine

## 2019-06-22 DIAGNOSIS — E1140 Type 2 diabetes mellitus with diabetic neuropathy, unspecified: Secondary | ICD-10-CM | POA: Diagnosis not present

## 2019-06-22 DIAGNOSIS — Z89511 Acquired absence of right leg below knee: Secondary | ICD-10-CM | POA: Diagnosis not present

## 2019-06-22 DIAGNOSIS — G894 Chronic pain syndrome: Secondary | ICD-10-CM | POA: Diagnosis not present

## 2019-06-22 DIAGNOSIS — M545 Low back pain: Secondary | ICD-10-CM | POA: Diagnosis not present

## 2019-07-20 DIAGNOSIS — Z89511 Acquired absence of right leg below knee: Secondary | ICD-10-CM | POA: Diagnosis not present

## 2019-07-20 DIAGNOSIS — E1140 Type 2 diabetes mellitus with diabetic neuropathy, unspecified: Secondary | ICD-10-CM | POA: Diagnosis not present

## 2019-07-20 DIAGNOSIS — M545 Low back pain: Secondary | ICD-10-CM | POA: Diagnosis not present

## 2019-07-20 DIAGNOSIS — G894 Chronic pain syndrome: Secondary | ICD-10-CM | POA: Diagnosis not present

## 2019-08-06 IMAGING — MR MR FOOT*R* W/O CM
6 series · 40 of 40 positions shown · non-contrast
Comparison: Radiographs from the same day

CLINICAL DATA: Ulceration along the lateral aspect of the forefoot
adjacent to the fifth metatarsal. Recent amputation of the third toe
1 month ago. Diabetes.

EXAM:
MRI OF THE RIGHT FOREFOOT WITHOUT CONTRAST
TECHNIQUE: Multiplanar, multisequence MR imaging of the right forefoot was
performed. No intravenous contrast was administered.

[Series 5001: T1 · axial · 2.0mm · 0.42mm/px · z∈[-119,-43]mm · 5 of 40 slices shown (1 of 2)]
[im 1/40]
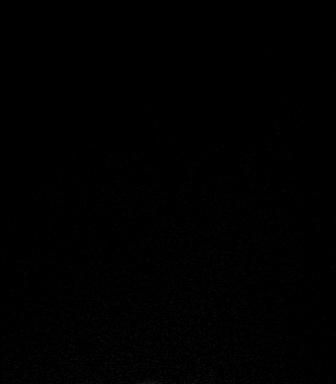
[im 10/40]
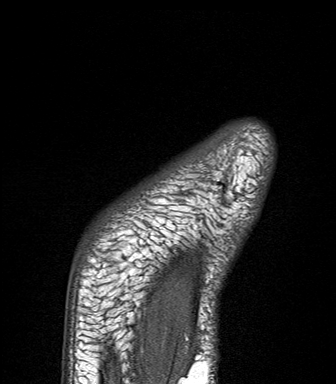
[im 20/40]
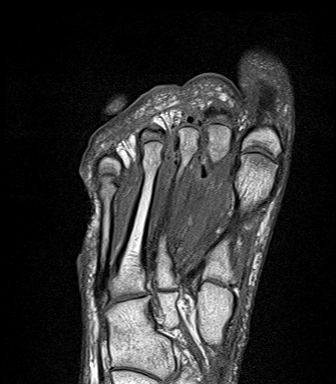
[im 30/40]
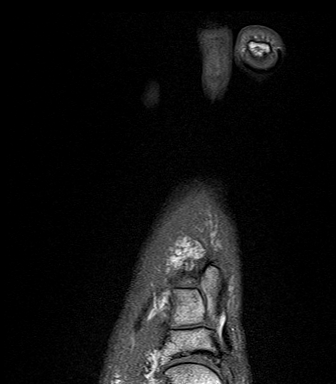
[im 40/40]
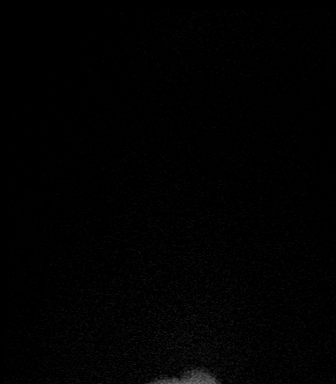

[Series 6001: STIR · axial · 2.0mm · 0.62mm/px · z∈[-118,-42]mm · 5 of 40 slices shown (1 of 2)]
[im 1/40]
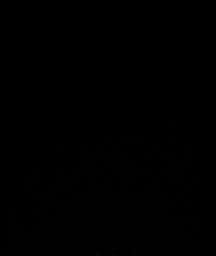
[im 10/40]
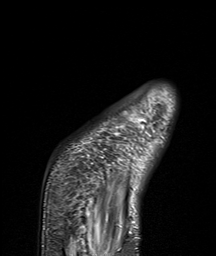
[im 20/40]
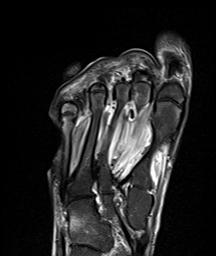
[im 30/40]
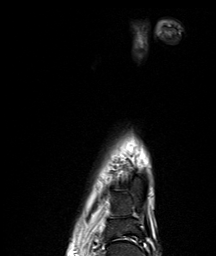
[im 40/40]
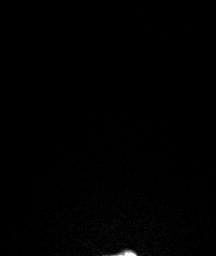

[Series 7001: T1 · coronal · 2.0mm · 0.36mm/px · 8 of 54 slices shown (2 of 2)]
[im 1/54]
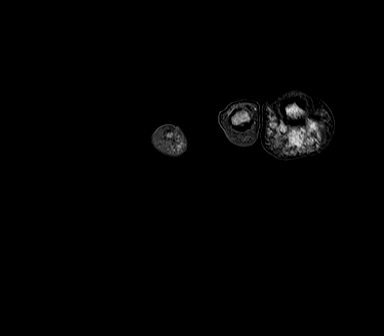
[im 8/54]
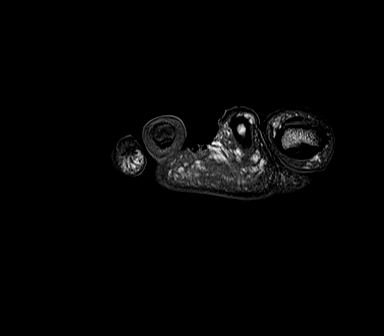
[im 16/54]
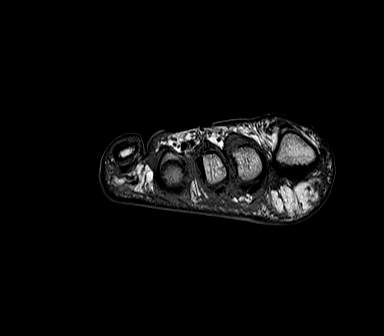
[im 23/54]
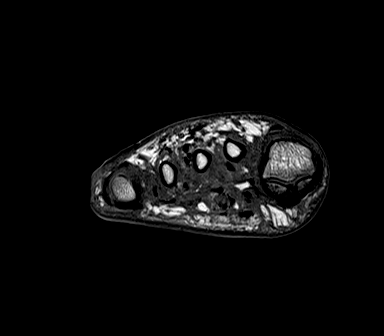
[im 31/54]
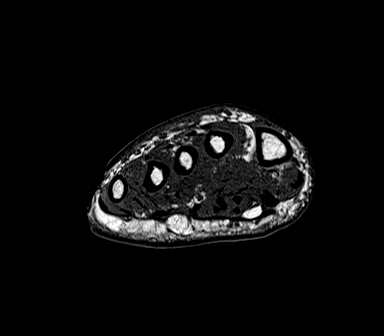
[im 38/54]
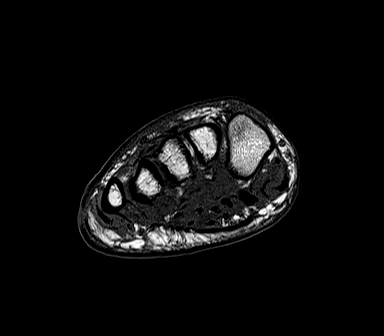
[im 46/54]
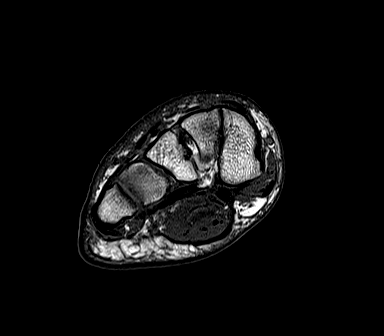
[im 54/54]
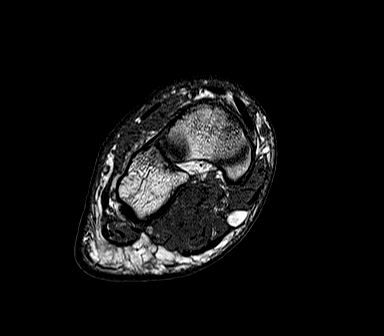

[Series 8001: T2 · coronal · 2.0mm · 0.55mm/px · 8 of 54 slices shown (1 of 2)]
[im 1/54]
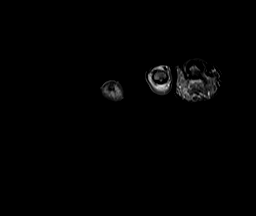
[im 8/54]
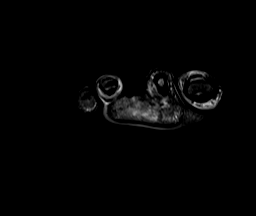
[im 16/54]
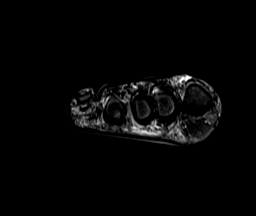
[im 23/54]
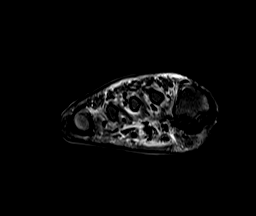
[im 31/54]
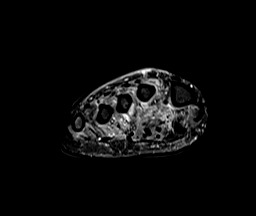
[im 38/54]
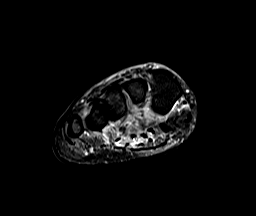
[im 46/54]
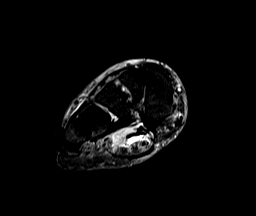
[im 54/54]
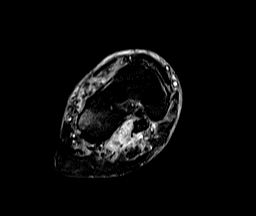

[Series 9001: T2 · coronal · 2.0mm · 0.55mm/px · 8 of 54 slices shown (2 of 2)]
[im 1/54]
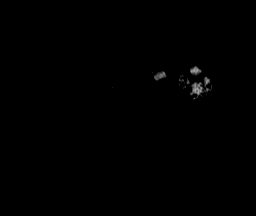
[im 8/54]
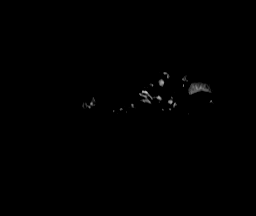
[im 16/54]
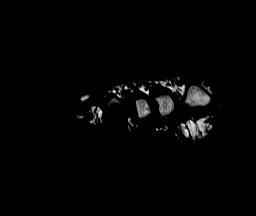
[im 23/54]
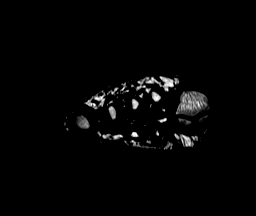
[im 31/54]
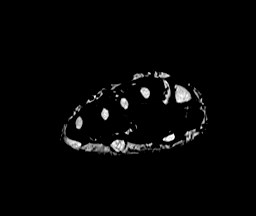
[im 38/54]
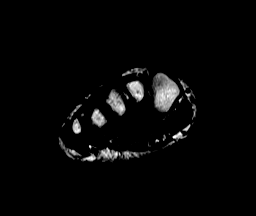
[im 46/54]
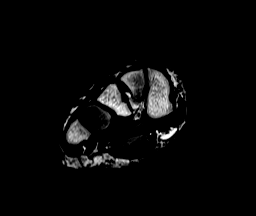
[im 54/54]
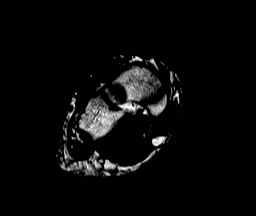

[STIR · sagittal · 2.0mm · 0.55mm/px · 6 of 46 slices shown (2 of 2)]
[im 1/46]
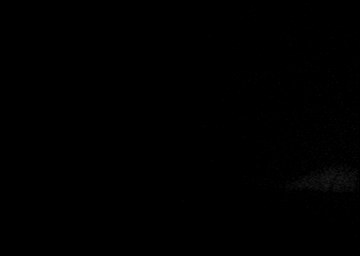
[im 10/46]
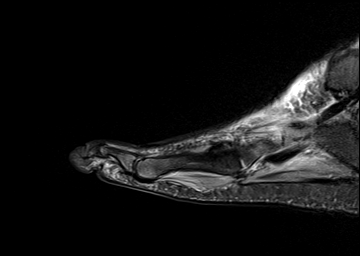
[im 19/46]
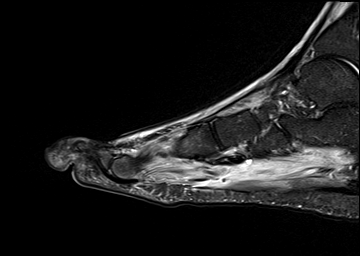
[im 28/46]
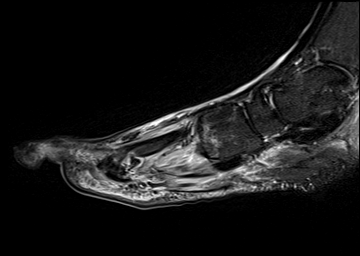
[im 37/46]
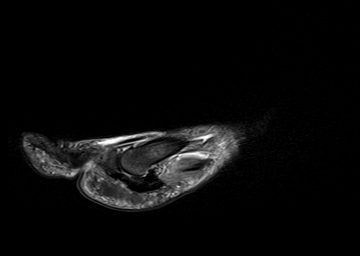
[im 46/46]
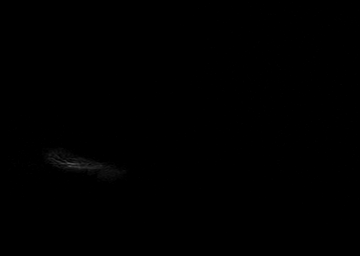

[40 of 40 positions shown; findings below may reference images not displayed]

FINDINGS: Bones/Joint/Cartilage

Maintained cortices without bone destruction or fracture. Minor
reactive marrow edema of the lateral aspect of the fifth metatarsal
head and neck and base of fifth metatarsal. No joint dislocation or
effusions. Status post amputation of the third toe at the MTP
articulation.

Mild joint space narrowing, spurring and subchondral cystic change
across the talonavicular joint with minor reactive edema along the
plantar aspect of the navicular. Mild cuboid reactive edema without
cortical bone loss..

Ligaments

Negative

Muscles and Tendons

Myositis of the plantar muscles of the forefoot. The flexor and
extensor tendons crossing the forefoot demonstrate no acute tear or
significant tenosynovitis.

Soft tissues

Diffuse soft tissue swelling of the forefoot without focal fluid
collections. Subcutaneous emphysema is noted of the forefoot. Soft
tissue ulceration along the lateral aspect of the forefoot likely
accounts for the emphysema.
IMPRESSION: 1. Cellulitis and myositis of the forefoot with soft tissue
ulceration adjacent to the lateral aspect of the fifth metatarsal
head. Associated subcutaneous emphysema is seen of the forefoot.
2. Reactive marrow edema is noted without cortical bone loss
involving the distal fifth metatarsal, proximal fifth phalanx and
cuboid.
3. Status post amputation of the third toe.

## 2019-08-11 DIAGNOSIS — D649 Anemia, unspecified: Secondary | ICD-10-CM | POA: Diagnosis not present

## 2019-08-11 DIAGNOSIS — I10 Essential (primary) hypertension: Secondary | ICD-10-CM | POA: Diagnosis not present

## 2019-08-11 DIAGNOSIS — R001 Bradycardia, unspecified: Secondary | ICD-10-CM | POA: Diagnosis not present

## 2019-08-11 DIAGNOSIS — N132 Hydronephrosis with renal and ureteral calculous obstruction: Secondary | ICD-10-CM | POA: Diagnosis not present

## 2019-08-11 DIAGNOSIS — N400 Enlarged prostate without lower urinary tract symptoms: Secondary | ICD-10-CM | POA: Diagnosis not present

## 2019-08-11 DIAGNOSIS — N1339 Other hydronephrosis: Secondary | ICD-10-CM | POA: Diagnosis not present

## 2019-08-11 DIAGNOSIS — E1165 Type 2 diabetes mellitus with hyperglycemia: Secondary | ICD-10-CM | POA: Diagnosis not present

## 2019-08-11 DIAGNOSIS — Z89511 Acquired absence of right leg below knee: Secondary | ICD-10-CM | POA: Diagnosis not present

## 2019-08-11 DIAGNOSIS — I700 Atherosclerosis of aorta: Secondary | ICD-10-CM | POA: Diagnosis not present

## 2019-08-11 DIAGNOSIS — R112 Nausea with vomiting, unspecified: Secondary | ICD-10-CM | POA: Diagnosis not present

## 2019-08-11 DIAGNOSIS — M47816 Spondylosis without myelopathy or radiculopathy, lumbar region: Secondary | ICD-10-CM | POA: Diagnosis not present

## 2019-08-11 DIAGNOSIS — R52 Pain, unspecified: Secondary | ICD-10-CM | POA: Diagnosis not present

## 2019-08-11 DIAGNOSIS — N201 Calculus of ureter: Secondary | ICD-10-CM | POA: Diagnosis not present

## 2019-08-13 DIAGNOSIS — K5730 Diverticulosis of large intestine without perforation or abscess without bleeding: Secondary | ICD-10-CM | POA: Diagnosis not present

## 2019-08-13 DIAGNOSIS — R0689 Other abnormalities of breathing: Secondary | ICD-10-CM | POA: Diagnosis not present

## 2019-08-13 DIAGNOSIS — R109 Unspecified abdominal pain: Secondary | ICD-10-CM | POA: Diagnosis not present

## 2019-08-13 DIAGNOSIS — N132 Hydronephrosis with renal and ureteral calculous obstruction: Secondary | ICD-10-CM | POA: Diagnosis not present

## 2019-08-13 DIAGNOSIS — N200 Calculus of kidney: Secondary | ICD-10-CM | POA: Diagnosis not present

## 2019-08-13 DIAGNOSIS — Z89511 Acquired absence of right leg below knee: Secondary | ICD-10-CM | POA: Diagnosis not present

## 2019-08-13 DIAGNOSIS — R112 Nausea with vomiting, unspecified: Secondary | ICD-10-CM | POA: Diagnosis not present

## 2019-08-13 DIAGNOSIS — R1084 Generalized abdominal pain: Secondary | ICD-10-CM | POA: Diagnosis not present

## 2019-08-13 DIAGNOSIS — E1165 Type 2 diabetes mellitus with hyperglycemia: Secondary | ICD-10-CM | POA: Diagnosis not present

## 2019-08-13 DIAGNOSIS — N134 Hydroureter: Secondary | ICD-10-CM | POA: Diagnosis not present

## 2019-08-13 DIAGNOSIS — R000 Tachycardia, unspecified: Secondary | ICD-10-CM | POA: Diagnosis not present

## 2019-08-13 DIAGNOSIS — I10 Essential (primary) hypertension: Secondary | ICD-10-CM | POA: Diagnosis not present

## 2019-08-13 DIAGNOSIS — E1065 Type 1 diabetes mellitus with hyperglycemia: Secondary | ICD-10-CM | POA: Diagnosis not present

## 2019-08-13 DIAGNOSIS — R1011 Right upper quadrant pain: Secondary | ICD-10-CM | POA: Diagnosis not present

## 2019-08-13 DIAGNOSIS — R531 Weakness: Secondary | ICD-10-CM | POA: Diagnosis not present

## 2019-08-13 DIAGNOSIS — N281 Cyst of kidney, acquired: Secondary | ICD-10-CM | POA: Diagnosis not present

## 2019-08-14 DIAGNOSIS — N136 Pyonephrosis: Secondary | ICD-10-CM | POA: Diagnosis not present

## 2019-08-14 DIAGNOSIS — N281 Cyst of kidney, acquired: Secondary | ICD-10-CM | POA: Diagnosis not present

## 2019-08-14 DIAGNOSIS — I252 Old myocardial infarction: Secondary | ICD-10-CM | POA: Diagnosis not present

## 2019-08-14 DIAGNOSIS — E1122 Type 2 diabetes mellitus with diabetic chronic kidney disease: Secondary | ICD-10-CM | POA: Diagnosis not present

## 2019-08-14 DIAGNOSIS — G8929 Other chronic pain: Secondary | ICD-10-CM | POA: Diagnosis not present

## 2019-08-14 DIAGNOSIS — R569 Unspecified convulsions: Secondary | ICD-10-CM | POA: Diagnosis not present

## 2019-08-14 DIAGNOSIS — E1065 Type 1 diabetes mellitus with hyperglycemia: Secondary | ICD-10-CM | POA: Diagnosis not present

## 2019-08-14 DIAGNOSIS — E785 Hyperlipidemia, unspecified: Secondary | ICD-10-CM | POA: Diagnosis not present

## 2019-08-14 DIAGNOSIS — N132 Hydronephrosis with renal and ureteral calculous obstruction: Secondary | ICD-10-CM | POA: Diagnosis not present

## 2019-08-14 DIAGNOSIS — R52 Pain, unspecified: Secondary | ICD-10-CM | POA: Diagnosis not present

## 2019-08-14 DIAGNOSIS — N189 Chronic kidney disease, unspecified: Secondary | ICD-10-CM | POA: Diagnosis not present

## 2019-08-14 DIAGNOSIS — N134 Hydroureter: Secondary | ICD-10-CM | POA: Diagnosis not present

## 2019-08-14 DIAGNOSIS — K5730 Diverticulosis of large intestine without perforation or abscess without bleeding: Secondary | ICD-10-CM | POA: Diagnosis not present

## 2019-08-14 DIAGNOSIS — I499 Cardiac arrhythmia, unspecified: Secondary | ICD-10-CM | POA: Diagnosis not present

## 2019-08-14 DIAGNOSIS — R1084 Generalized abdominal pain: Secondary | ICD-10-CM | POA: Diagnosis not present

## 2019-08-14 DIAGNOSIS — R531 Weakness: Secondary | ICD-10-CM | POA: Diagnosis not present

## 2019-08-14 DIAGNOSIS — R112 Nausea with vomiting, unspecified: Secondary | ICD-10-CM | POA: Diagnosis not present

## 2019-08-14 DIAGNOSIS — E1165 Type 2 diabetes mellitus with hyperglycemia: Secondary | ICD-10-CM | POA: Diagnosis not present

## 2019-08-14 DIAGNOSIS — R29810 Facial weakness: Secondary | ICD-10-CM | POA: Diagnosis not present

## 2019-08-14 DIAGNOSIS — N201 Calculus of ureter: Secondary | ICD-10-CM | POA: Diagnosis not present

## 2019-08-14 DIAGNOSIS — N179 Acute kidney failure, unspecified: Secondary | ICD-10-CM | POA: Diagnosis not present

## 2019-08-14 DIAGNOSIS — N200 Calculus of kidney: Secondary | ICD-10-CM | POA: Diagnosis not present

## 2019-08-14 DIAGNOSIS — R000 Tachycardia, unspecified: Secondary | ICD-10-CM | POA: Diagnosis not present

## 2019-08-14 DIAGNOSIS — E872 Acidosis: Secondary | ICD-10-CM | POA: Diagnosis not present

## 2019-08-15 DIAGNOSIS — N200 Calculus of kidney: Secondary | ICD-10-CM | POA: Diagnosis not present

## 2019-08-15 DIAGNOSIS — N201 Calculus of ureter: Secondary | ICD-10-CM | POA: Diagnosis not present

## 2019-08-16 DIAGNOSIS — N201 Calculus of ureter: Secondary | ICD-10-CM | POA: Diagnosis not present

## 2019-08-17 DIAGNOSIS — N201 Calculus of ureter: Secondary | ICD-10-CM | POA: Diagnosis not present

## 2019-08-21 DIAGNOSIS — G894 Chronic pain syndrome: Secondary | ICD-10-CM | POA: Diagnosis not present

## 2019-08-21 DIAGNOSIS — M545 Low back pain: Secondary | ICD-10-CM | POA: Diagnosis not present

## 2019-08-21 DIAGNOSIS — E1140 Type 2 diabetes mellitus with diabetic neuropathy, unspecified: Secondary | ICD-10-CM | POA: Diagnosis not present

## 2019-08-21 DIAGNOSIS — Z89511 Acquired absence of right leg below knee: Secondary | ICD-10-CM | POA: Diagnosis not present

## 2019-09-14 DIAGNOSIS — Z960 Presence of urogenital implants: Secondary | ICD-10-CM | POA: Diagnosis not present

## 2019-09-14 DIAGNOSIS — N201 Calculus of ureter: Secondary | ICD-10-CM | POA: Diagnosis not present

## 2019-09-14 DIAGNOSIS — R109 Unspecified abdominal pain: Secondary | ICD-10-CM | POA: Diagnosis not present

## 2019-09-14 DIAGNOSIS — Z48816 Encounter for surgical aftercare following surgery on the genitourinary system: Secondary | ICD-10-CM | POA: Diagnosis not present

## 2019-09-17 DIAGNOSIS — M545 Low back pain: Secondary | ICD-10-CM | POA: Diagnosis not present

## 2019-09-17 DIAGNOSIS — Z89511 Acquired absence of right leg below knee: Secondary | ICD-10-CM | POA: Diagnosis not present

## 2019-09-17 DIAGNOSIS — E1140 Type 2 diabetes mellitus with diabetic neuropathy, unspecified: Secondary | ICD-10-CM | POA: Diagnosis not present

## 2019-09-17 DIAGNOSIS — G894 Chronic pain syndrome: Secondary | ICD-10-CM | POA: Diagnosis not present

## 2019-09-27 DIAGNOSIS — Z01818 Encounter for other preprocedural examination: Secondary | ICD-10-CM | POA: Diagnosis not present

## 2019-10-11 DIAGNOSIS — Z01818 Encounter for other preprocedural examination: Secondary | ICD-10-CM | POA: Diagnosis not present

## 2019-10-14 DIAGNOSIS — E785 Hyperlipidemia, unspecified: Secondary | ICD-10-CM | POA: Diagnosis not present

## 2019-10-14 DIAGNOSIS — E1122 Type 2 diabetes mellitus with diabetic chronic kidney disease: Secondary | ICD-10-CM | POA: Diagnosis not present

## 2019-10-14 DIAGNOSIS — Z466 Encounter for fitting and adjustment of urinary device: Secondary | ICD-10-CM | POA: Diagnosis not present

## 2019-10-14 DIAGNOSIS — N390 Urinary tract infection, site not specified: Secondary | ICD-10-CM | POA: Diagnosis not present

## 2019-10-14 DIAGNOSIS — G40909 Epilepsy, unspecified, not intractable, without status epilepticus: Secondary | ICD-10-CM | POA: Diagnosis not present

## 2019-10-14 DIAGNOSIS — Z8673 Personal history of transient ischemic attack (TIA), and cerebral infarction without residual deficits: Secondary | ICD-10-CM | POA: Diagnosis not present

## 2019-10-14 DIAGNOSIS — M545 Low back pain: Secondary | ICD-10-CM | POA: Diagnosis not present

## 2019-10-14 DIAGNOSIS — N182 Chronic kidney disease, stage 2 (mild): Secondary | ICD-10-CM | POA: Diagnosis not present

## 2019-10-14 DIAGNOSIS — I129 Hypertensive chronic kidney disease with stage 1 through stage 4 chronic kidney disease, or unspecified chronic kidney disease: Secondary | ICD-10-CM | POA: Diagnosis not present

## 2019-10-14 DIAGNOSIS — Z960 Presence of urogenital implants: Secondary | ICD-10-CM | POA: Diagnosis not present

## 2019-10-14 DIAGNOSIS — G8929 Other chronic pain: Secondary | ICD-10-CM | POA: Diagnosis not present

## 2019-10-14 DIAGNOSIS — N201 Calculus of ureter: Secondary | ICD-10-CM | POA: Diagnosis not present

## 2019-10-14 DIAGNOSIS — E1165 Type 2 diabetes mellitus with hyperglycemia: Secondary | ICD-10-CM | POA: Diagnosis not present

## 2019-10-14 DIAGNOSIS — Z89511 Acquired absence of right leg below knee: Secondary | ICD-10-CM | POA: Diagnosis not present

## 2019-10-14 DIAGNOSIS — G894 Chronic pain syndrome: Secondary | ICD-10-CM | POA: Diagnosis not present

## 2019-10-14 DIAGNOSIS — E1140 Type 2 diabetes mellitus with diabetic neuropathy, unspecified: Secondary | ICD-10-CM | POA: Diagnosis not present

## 2019-10-14 DIAGNOSIS — I252 Old myocardial infarction: Secondary | ICD-10-CM | POA: Diagnosis not present

## 2020-04-11 ENCOUNTER — Ambulatory Visit: Payer: Medicare Other | Attending: Internal Medicine

## 2020-04-11 DIAGNOSIS — Z23 Encounter for immunization: Secondary | ICD-10-CM

## 2020-04-11 NOTE — Progress Notes (Signed)
   Covid-19 Vaccination Clinic  Name:  Luke Manning    MRN: [id number] DOB: 04-23-1961  04/11/2020  Luke Manning was observed post Covid-19 immunization for 15 minutes without incident. He was provided with Vaccine Information Sheet and instruction to access the V-Safe system.   Luke Manning was instructed to call 911 with any severe reactions post vaccine: Marland Kitchen Difficulty breathing  . Swelling of face and throat  . A fast heartbeat  . A bad rash all over body  . Dizziness and weakness   Immunizations Administered    Name Date Dose VIS Date Route   Pfizer COVID-19 Vaccine 04/11/2020  9:34 AM 0.3 mL 02/17/2019 Intramuscular   Manufacturer: [employer], [employer]   Lot: [id number]   NDC: [address]-7

## 2020-05-02 ENCOUNTER — Ambulatory Visit: Payer: Medicare Other | Attending: Internal Medicine

## 2020-05-02 DIAGNOSIS — Z23 Encounter for immunization: Secondary | ICD-10-CM

## 2020-05-02 NOTE — Progress Notes (Signed)
   Covid-19 Vaccination Clinic  Name:  Shishir Krantz    MRN: [id number] DOB: Aug 23, 1961  05/02/2020  Mr. Paige was observed post Covid-19 immunization for 15 minutes without incident. He was provided with Vaccine Information Sheet and instruction to access the V-Safe system.   Mr. Gilreath was instructed to call 911 with any severe reactions post vaccine: Marland Kitchen Difficulty breathing  . Swelling of face and throat  . A fast heartbeat  . A bad rash all over body  . Dizziness and weakness   Immunizations Administered    Name Date Dose VIS Date Route   Pfizer COVID-19 Vaccine 05/02/2020  9:47 AM 0.3 mL 02/17/2019 Intramuscular   Manufacturer: [employer], [employer]   Lot: [id number]   NDC: [address]-6

## 2020-12-30 DIAGNOSIS — E1165 Type 2 diabetes mellitus with hyperglycemia: Secondary | ICD-10-CM | POA: Diagnosis not present

## 2020-12-30 DIAGNOSIS — E1140 Type 2 diabetes mellitus with diabetic neuropathy, unspecified: Secondary | ICD-10-CM | POA: Diagnosis not present

## 2020-12-30 DIAGNOSIS — E1151 Type 2 diabetes mellitus with diabetic peripheral angiopathy without gangrene: Secondary | ICD-10-CM | POA: Diagnosis not present

## 2020-12-30 DIAGNOSIS — I10 Essential (primary) hypertension: Secondary | ICD-10-CM | POA: Diagnosis not present

## 2020-12-30 DIAGNOSIS — R0602 Shortness of breath: Secondary | ICD-10-CM | POA: Diagnosis not present

## 2020-12-30 DIAGNOSIS — Z008 Encounter for other general examination: Secondary | ICD-10-CM | POA: Diagnosis not present

## 2021-01-06 DIAGNOSIS — Z79899 Other long term (current) drug therapy: Secondary | ICD-10-CM | POA: Diagnosis not present

## 2021-01-06 DIAGNOSIS — Z79891 Long term (current) use of opiate analgesic: Secondary | ICD-10-CM | POA: Diagnosis not present

## 2021-01-06 DIAGNOSIS — M25561 Pain in right knee: Secondary | ICD-10-CM | POA: Diagnosis not present

## 2021-01-15 DIAGNOSIS — E119 Type 2 diabetes mellitus without complications: Secondary | ICD-10-CM | POA: Diagnosis not present

## 2021-01-15 DIAGNOSIS — Z0100 Encounter for examination of eyes and vision without abnormal findings: Secondary | ICD-10-CM | POA: Diagnosis not present

## 2023-02-22 DEATH — deceased
